# Patient Record
Sex: Male | Born: 1940 | Race: White | Hispanic: No | Marital: Married | Smoking: Never smoker
Health system: Southern US, Community
[De-identification: ages and names within clinical notes are randomized; demographics above are authoritative.]

## PROBLEM LIST (undated history)

## (undated) DIAGNOSIS — G9050 Complex regional pain syndrome I, unspecified: Secondary | ICD-10-CM

## (undated) DIAGNOSIS — G709 Myoneural disorder, unspecified: Secondary | ICD-10-CM

## (undated) DIAGNOSIS — M1990 Unspecified osteoarthritis, unspecified site: Secondary | ICD-10-CM

## (undated) DIAGNOSIS — E785 Hyperlipidemia, unspecified: Secondary | ICD-10-CM

## (undated) DIAGNOSIS — K219 Gastro-esophageal reflux disease without esophagitis: Secondary | ICD-10-CM

## (undated) DIAGNOSIS — G20A1 Parkinson's disease without dyskinesia, without mention of fluctuations: Secondary | ICD-10-CM

## (undated) DIAGNOSIS — E119 Type 2 diabetes mellitus without complications: Secondary | ICD-10-CM

## (undated) DIAGNOSIS — R32 Unspecified urinary incontinence: Secondary | ICD-10-CM

## (undated) DIAGNOSIS — G20 Parkinson's disease: Secondary | ICD-10-CM

## (undated) DIAGNOSIS — F419 Anxiety disorder, unspecified: Secondary | ICD-10-CM

## (undated) HISTORY — DX: Hyperlipidemia, unspecified: E78.5

## (undated) HISTORY — PX: COLON SURGERY: SHX602

## (undated) HISTORY — PX: DEEP BRAIN STIMULATOR PLACEMENT: SHX608

## (undated) HISTORY — PX: CARDIAC CATHETERIZATION: SHX172

## (undated) HISTORY — DX: Parkinson's disease: G20

## (undated) HISTORY — PX: APPENDECTOMY: SHX54

## (undated) HISTORY — DX: Parkinson's disease without dyskinesia, without mention of fluctuations: G20.A1

## (undated) HISTORY — DX: Gastro-esophageal reflux disease without esophagitis: K21.9

## (undated) HISTORY — PX: BRAIN SURGERY: SHX531

---

## 1999-08-13 ENCOUNTER — Encounter: Payer: Self-pay | Admitting: *Deleted

## 1999-08-18 ENCOUNTER — Inpatient Hospital Stay (HOSPITAL_COMMUNITY): Admission: RE | Admit: 1999-08-18 | Discharge: 1999-08-25 | Payer: Self-pay | Admitting: *Deleted

## 1999-10-08 ENCOUNTER — Ambulatory Visit (HOSPITAL_COMMUNITY): Admission: RE | Admit: 1999-10-08 | Discharge: 1999-10-08 | Payer: Self-pay | Admitting: Orthopedic Surgery

## 1999-10-08 ENCOUNTER — Encounter: Payer: Self-pay | Admitting: Orthopedic Surgery

## 1999-12-14 ENCOUNTER — Ambulatory Visit (HOSPITAL_COMMUNITY): Admission: RE | Admit: 1999-12-14 | Discharge: 1999-12-14 | Payer: Self-pay | Admitting: Orthopedic Surgery

## 1999-12-14 ENCOUNTER — Encounter: Payer: Self-pay | Admitting: Orthopedic Surgery

## 2000-07-21 ENCOUNTER — Encounter: Admission: RE | Admit: 2000-07-21 | Discharge: 2000-10-19 | Payer: Self-pay | Admitting: Internal Medicine

## 2005-05-10 ENCOUNTER — Encounter: Admission: RE | Admit: 2005-05-10 | Discharge: 2005-05-10 | Payer: Self-pay | Admitting: Orthopaedic Surgery

## 2005-05-11 ENCOUNTER — Ambulatory Visit (HOSPITAL_COMMUNITY): Admission: RE | Admit: 2005-05-11 | Discharge: 2005-05-11 | Payer: Self-pay | Admitting: Orthopaedic Surgery

## 2005-05-11 ENCOUNTER — Ambulatory Visit (HOSPITAL_BASED_OUTPATIENT_CLINIC_OR_DEPARTMENT_OTHER): Admission: RE | Admit: 2005-05-11 | Discharge: 2005-05-11 | Payer: Self-pay | Admitting: Orthopaedic Surgery

## 2008-09-26 ENCOUNTER — Ambulatory Visit (HOSPITAL_BASED_OUTPATIENT_CLINIC_OR_DEPARTMENT_OTHER): Admission: RE | Admit: 2008-09-26 | Discharge: 2008-09-26 | Payer: Self-pay | Admitting: Orthopedic Surgery

## 2011-04-20 NOTE — Op Note (Signed)
NAME:  TURON, KILMER NO.:  [id number]   MEDICAL RECORD NO.:  [id number]          PATIENT TYPE:  AMB   LOCATION:  DSC                          FACILITY:  [facility]   PHYSICIAN:  Cindee Salt, M.D.       DATE OF BIRTH:  11-15-1941   DATE OF PROCEDURE:  09/26/2008  DATE OF DISCHARGE:                               OPERATIVE REPORT   PREOPERATIVE DIAGNOSIS:  Intra-articular fracture distal phalanx, P2,  right middle finger.   POSTOPERATIVE DIAGNOSIS:  Intra-articular fracture distal phalanx, P2,  right middle finger.   OPERATION:  Closed reduction, percutaneous pinning, intra-articular  fracture distal interphalangeal joint, P2, right middle finger.   SURGEON:  Cindee Salt, MD   ANESTHESIA:  General with metacarpal block.   HISTORY:  The patient is a 70 year old male with a history of a fracture  of his right middle finger when trying to contain his dog.  He had  lesion, he suffered the injury.  X-rays revealed a intra-articular  fracture of the middle phalanx of his right middle finger with  displacement.  Plan is for percutaneous possible open reduction and  internal fixation.  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  He is aware of the possibility of  recurrence of the complex regional pain, infection, recurrence injury to  arteries, nerves, tendons, complete relief of symptoms, dystrophy,  nonunion, avascular necrosis.  He has elect to proceed to have this  pinned to prevent further displacement.  The patient was seen in the  preoperative area.  The extremity marked by both the patient and surgeon  and antibiotic given.   PROCEDURE:  The patient was brought to the operating room where a  general anesthetic was carried out under the direction of Dr. Gelene Mink.  He was prepped using DuraPrep in supine position with right arm free.  Time-out was taken.  The OEC was brought into position.  The finger  manipulated.  X-rays  revealed that it was in a reduced position.  This  was then pinned under image intensification with 2A K-wires.  These were  placed so as not to be parallel.  AP and lateral x-rays with insertion  of each pin and afterwards revealed that the fracture was reduced, the  pins in good position.  The pins were bent and cut short.  A sterile  compressive dressing and splint to the finger applied prior to the  application of the dressing.  A metacarpal block was given with 1%  Xylocaine with 0.25% Marcaine without epinephrine, 6 mL was used.  The  patient tolerated the procedure well.  He will be discharged to home and  return to [facility] in [place] in 1 week on Percocet.           ______________________________  Cindee Salt, M.D.     GK/MEDQ  D:  09/26/2008  T:  09/27/2008  Job:  [id number]

## 2011-04-23 NOTE — Op Note (Signed)
NAME:  Randall [name], Randall [name] NO.:  [id number]   MEDICAL RECORD NO.:  [id number]          PATIENT TYPE:  AMB   LOCATION:  DSC                          FACILITY:  [facility]   PHYSICIAN:  Lubertha Basque. Dalldorf, M.D.DATE OF BIRTH:  06-24-1941   DATE OF PROCEDURE:  05/11/2005  DATE OF DISCHARGE:                                 OPERATIVE REPORT   PREOPERATIVE DIAGNOSES:  1. Right knee torn medial meniscus.  2. Right knee degenerative joint disease.   POSTOPERATIVE DIAGNOSES:  1. Right knee torn medial meniscus.  2. Right knee degenerative joint disease.   PROCEDURES:  1. Right knee partial medial meniscectomy.  2. Right knee abrasion chondroplasty.   ANESTHESIA:  Block and general.   ATTENDING SURGEON:  Lubertha Basque. Jerl Santos, M.D.   ASSISTANT:  Lindwood Qua, P.A.   INDICATIONS FOR PROCEDURE:  The patient is a 70 year old man with a long  history of right knee pain and swelling. This has persisted despite oral  anti-inflammatories and an injection. He has undergone an MRI scan which  shows a medial meniscus tear and some degenerative changes. He is offered an  arthroscopy as he has pain which interrupts his rest and limits his  activities. Informed operative consent was obtained after discussion of  possible complications of reaction to anesthesia and infection.   DESCRIPTION OF PROCEDURE:  The patient was taken to the operating suite  where general anesthetic was applied without difficulty. He was also given a  block in the preanesthesia area.  He was positioned supine and prepped and  draped in normal sterile fashion. After administration of preoperative IV  antibiotics, arthroscopy right knee was performed through a total of two  inferior portals. Suprapatellar pouch was benign while the patellofemoral  joint exhibited some grade III and afocal area of grade IV change in the  intertrochlear groove. A chondroplasty was done of some loose flaps of  articular cartilage and  an abrasion back to bleeding bone was done in  intertrochlear groove and a small area of  grade IV change. In the medial  compartment, he did have a radial tear of the posterior horn of the medial  meniscus addressed with about 5% partial medial meniscectomy. He had some  grade III changes across the femur addressed with a chondroplasty. The  lateral compartment was relatively benign with no significant articular  cartilage or meniscal injuries. The ACL appeared to be intact. The knee was  thoroughly irrigated at the end of the case followed by placement of  Marcaine with some Depo-Medrol. Adaptic was placed over the wounds followed  by dry gauze and loose Ace wrap. Estimated blood loss and intraoperative  fluids can be obtained from anesthesia records.   DISPOSITION:  The patient was extubated in the operating room taken to  recovery in stable condition. Plans were for him to go home the same day and  to follow up in the office in less than a week. I will contact him by phone  tonight.       PGD/MEDQ  D:  05/11/2005  T:  05/11/2005  Job:  [id number] 

## 2011-09-06 LAB — POCT HEMOGLOBIN-HEMACUE: Hemoglobin: 14.2

## 2011-09-06 LAB — BASIC METABOLIC PANEL
BUN: 13
CO2: 25
Calcium: 9.2
Chloride: 105
Creatinine, Ser: 0.84
GFR calc Af Amer: >60
GFR calc non Af Amer: >60
Glucose, Bld: 229 — ABNORMAL HIGH
Potassium: 4.1
Sodium: 139

## 2011-09-06 LAB — GLUCOSE, CAPILLARY: Glucose-Capillary: 117 — ABNORMAL HIGH

## 2011-09-28 ENCOUNTER — Other Ambulatory Visit: Payer: Self-pay | Admitting: Dermatology

## 2013-06-29 ENCOUNTER — Encounter: Payer: Self-pay | Admitting: Internal Medicine

## 2014-01-14 DIAGNOSIS — G20 Parkinson's disease: Secondary | ICD-10-CM | POA: Insufficient documentation

## 2014-01-14 DIAGNOSIS — N400 Enlarged prostate without lower urinary tract symptoms: Secondary | ICD-10-CM | POA: Insufficient documentation

## 2014-01-14 DIAGNOSIS — E291 Testicular hypofunction: Secondary | ICD-10-CM | POA: Insufficient documentation

## 2014-01-31 ENCOUNTER — Encounter: Payer: Self-pay | Admitting: Podiatry

## 2014-01-31 ENCOUNTER — Ambulatory Visit (INDEPENDENT_AMBULATORY_CARE_PROVIDER_SITE_OTHER): Payer: Medicare Other | Admitting: Podiatry

## 2014-01-31 VITALS — BP 116/86 | HR 64 | Resp 12

## 2014-01-31 DIAGNOSIS — B351 Tinea unguium: Secondary | ICD-10-CM

## 2014-01-31 DIAGNOSIS — M19079 Primary osteoarthritis, unspecified ankle and foot: Secondary | ICD-10-CM

## 2014-01-31 DIAGNOSIS — M779 Enthesopathy, unspecified: Secondary | ICD-10-CM

## 2014-01-31 DIAGNOSIS — L84 Corns and callosities: Secondary | ICD-10-CM

## 2014-01-31 DIAGNOSIS — M79609 Pain in unspecified limb: Secondary | ICD-10-CM

## 2014-01-31 MED ORDER — TRIAMCINOLONE ACETONIDE 10 MG/ML IJ SUSP
10.0000 mg | Freq: Once | INTRAMUSCULAR | Status: AC
  Administered 2014-01-31: 10 mg

## 2014-01-31 NOTE — Progress Notes (Signed)
   Subjective:    Patient ID: Randall [name], male    DOB: April 02, 1941, 73 y.o.   MRN: [id number]  HPI '' TOENAILS AND CORN TRIM ON THE RT FOOT 3RD  TOE.'' ALSO, THE GREAT TOENAIL OR WHOLE TOE IS THROBBING FOR 6 MONTHS. THE PAIN IS NOT GOTTEN WORSEN, BUT IS BEEN THE SAME. TREATMENT TRIED NEOSPORIN ONCE A DAY IT HELPS FOR A LITTLE WHILE.''    Review of Systems  All other systems reviewed and are negative.       Objective:   Physical Exam        Assessment & Plan:

## 2014-02-01 NOTE — Progress Notes (Signed)
Subjective:     Patient ID: Randall [name], male   DOB: Apr 11, 1941, 73 y.o.   MRN: [id number]  HPI patient presents stating I am having pain around my toenails I get this painful corn on both feet and I have swelling of my joint of my right big toe which is increasingly tender form he   Review of Systems     Objective:   Physical Exam Neurovascular status unchanged with patient well oriented x3 in incurvated nail bed 1-5 of both feet that are sore with inflammation and fluid around the interphalangeal joint of the right hallux. Patient is found to have keratotic lesions sub-metatarsals of both feet that are painful    Assessment:     Mycotic nail infection with pain 1-5 both feet and keratotic lesions of both feet. 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 Injected the interphalangeal joint right 3 mg Kenalog 5 mg Xylocaine Marcaine mixture to reduce inflammation

## 2014-02-05 ENCOUNTER — Other Ambulatory Visit: Payer: Self-pay | Admitting: Orthopaedic Surgery

## 2014-02-12 ENCOUNTER — Encounter (HOSPITAL_COMMUNITY): Payer: Self-pay

## 2014-02-12 ENCOUNTER — Encounter (HOSPITAL_COMMUNITY)
Admission: RE | Admit: 2014-02-12 | Discharge: 2014-02-12 | Disposition: A | Discharge status: Home / Self Care | Payer: Medicare Other | Source: Ambulatory Visit | Attending: Orthopaedic Surgery | Admitting: Orthopaedic Surgery

## 2014-02-12 ENCOUNTER — Ambulatory Visit (HOSPITAL_COMMUNITY)
Admission: RE | Admit: 2014-02-12 | Discharge: 2014-02-12 | Disposition: A | Discharge status: Home / Self Care | Payer: Medicare Other | Source: Ambulatory Visit | Attending: Orthopaedic Surgery | Admitting: Orthopaedic Surgery

## 2014-02-12 DIAGNOSIS — Z01818 Encounter for other preprocedural examination: Secondary | ICD-10-CM | POA: Insufficient documentation

## 2014-02-12 DIAGNOSIS — Z01810 Encounter for preprocedural cardiovascular examination: Secondary | ICD-10-CM | POA: Insufficient documentation

## 2014-02-12 DIAGNOSIS — Z01812 Encounter for preprocedural laboratory examination: Secondary | ICD-10-CM | POA: Insufficient documentation

## 2014-02-12 HISTORY — DX: Anxiety disorder, unspecified: F41.9

## 2014-02-12 HISTORY — DX: Unspecified osteoarthritis, unspecified site: M19.90

## 2014-02-12 HISTORY — DX: Complex regional pain syndrome I, unspecified: G90.50

## 2014-02-12 HISTORY — DX: Unspecified urinary incontinence: R32

## 2014-02-12 HISTORY — DX: Type 2 diabetes mellitus without complications: E11.9

## 2014-02-12 HISTORY — DX: Myoneural disorder, unspecified: G70.9

## 2014-02-12 LAB — TYPE AND SCREEN
ABO/RH(D): O POS
Antibody Screen: NEGATIVE

## 2014-02-12 LAB — PROTIME-INR
INR: 1.03 (ref 0.00–1.49)
Prothrombin Time: 13.3 seconds (ref 11.6–15.2)

## 2014-02-12 LAB — URINE MICROSCOPIC-ADD ON

## 2014-02-12 LAB — URINALYSIS, ROUTINE W REFLEX MICROSCOPIC
Bilirubin Urine: NEGATIVE
HGB URINE DIPSTICK: NEGATIVE
KETONES UR: NEGATIVE mg/dL
Leukocytes, UA: NEGATIVE
Nitrite: NEGATIVE
PROTEIN: NEGATIVE mg/dL
Specific Gravity, Urine: 1.025 (ref 1.005–1.030)
Urobilinogen, UA: 0.2 mg/dL (ref 0.0–1.0)
pH: 5 (ref 5.0–8.0)

## 2014-02-12 LAB — BASIC METABOLIC PANEL
BUN: 15 mg/dL (ref 6–23)
CO2: 26 mEq/L (ref 19–32)
Calcium: 9.1 mg/dL (ref 8.4–10.5)
Chloride: 101 mEq/L (ref 96–112)
Creatinine, Ser: 0.71 mg/dL (ref 0.50–1.35)
Glucose, Bld: 262 mg/dL — ABNORMAL HIGH (ref 70–99)
POTASSIUM: 4 meq/L (ref 3.7–5.3)
Sodium: 140 mEq/L (ref 137–147)

## 2014-02-12 LAB — CBC WITH DIFFERENTIAL/PLATELET
BASOS ABS: 0 10*3/uL (ref 0.0–0.1)
BASOS PCT: 0 % (ref 0–1)
Eosinophils Absolute: 0.5 10*3/uL (ref 0.0–0.7)
Eosinophils Relative: 5 % (ref 0–5)
HCT: 36.9 % — ABNORMAL LOW (ref 39.0–52.0)
HEMOGLOBIN: 13 g/dL (ref 13.0–17.0)
Lymphocytes Relative: 18 % (ref 12–46)
Lymphs Abs: 1.7 10*3/uL (ref 0.7–4.0)
MCH: 33.9 pg (ref 26.0–34.0)
MCHC: 35.2 g/dL (ref 30.0–36.0)
MCV: 96.1 fL (ref 78.0–100.0)
MONOS PCT: 7 % (ref 3–12)
Monocytes Absolute: 0.6 10*3/uL (ref 0.1–1.0)
NEUTROS ABS: 6.8 10*3/uL (ref 1.7–7.7)
Neutrophils Relative %: 70 % (ref 43–77)
Platelets: 209 10*3/uL (ref 150–400)
RBC: 3.84 MIL/uL — ABNORMAL LOW (ref 4.22–5.81)
RDW: 14.5 % (ref 11.5–15.5)
WBC: 9.7 10*3/uL (ref 4.0–10.5)

## 2014-02-12 LAB — SURGICAL PCR SCREEN
MRSA, PCR: NEGATIVE
STAPHYLOCOCCUS AUREUS: NEGATIVE

## 2014-02-12 LAB — APTT: aPTT: 29 seconds (ref 24–37)

## 2014-02-12 LAB — ABO/RH: ABO/RH(D): O POS

## 2014-02-12 NOTE — Pre-Procedure Instructions (Signed)
Randall [name]  02/12/2014   Your procedure is scheduled on:  Tuesday, March 17th   [facility] at 5;30 AM.   Call this number if you have problems the morning of surgery: [phone]   Remember:   Do not eat food or drink liquids after midnight Monday.   Take these medicines the morning of surgery with A SIP OF WATER: Effexor, Sinemet IR   Do not wear jewelry - no rings or watches.  Do not wear lotions or colognes.  You may NOT wear deodorant.   Men may shave face and neck.   Do not bring valuables to the hospital.  [facility] any belongings or valuables.               Contacts, dentures or bridgework may not be worn into surgery.  Leave suitcase in the car. After surgery it may be brought to your room.  For patients admitted to the hospital, discharge time is determined by your treatment team.               Name and phone number of your driver:    Special Instructions:  "Preparing for Surgery" Instruction Sheet   Please read over the following fact sheets that you were given: Pain Booklet, Coughing and Deep Breathing, Blood Transfusion Information, MRSA Information and Surgical Site Infection Prevention

## 2014-02-12 NOTE — Progress Notes (Addendum)
H/O MVP---had seen Dr. Jenne CampusMcQueen and had a cath about 10 yrs ago.Marland Kitchen.Hasn't seen him since then.  But relies on Dr. Wylene Simmerisovec now. His Neuro [facility]ison [facility]--lov 2014 ("see outside records") Urology is Dr. Darvin Neighbourson Davis

## 2014-02-13 NOTE — H&P (Signed)
TOTAL KNEE ADMISSION H&P  Patient is being admitted for right total knee arthroplasty.  Subjective:  Chief Complaint:right knee pain.  HPI: Randall [name], 73 y.o. male, has a history of pain and functional disability in the right knee due to arthritis and has failed non-surgical conservative treatments for greater than 12 weeks to includeNSAID's and/or analgesics, corticosteriod injections, viscosupplementation injections, supervised PT with diminished ADL's post treatment, use of assistive devices, weight reduction as appropriate and activity modification.  Onset of symptoms was gradual, starting 6 years ago with gradually worsening course since that time. The patient noted prior procedures on the knee to include  arthroscopy on the right knee(s).  Patient currently rates pain in the right knee(s) at 9 out of 10 with activity. Patient has night pain, worsening of pain with activity and weight bearing, pain that interferes with activities of daily living, pain with passive range of motion, crepitus and joint swelling.  Patient has evidence of subchondral sclerosis, periarticular osteophytes and joint space narrowing by imaging studies. This patient has had no. There is no active infection.  There are no active problems to display for this patient.  Past Medical History  Diagnosis Date  . Hyperlipidemia   . Parkinson disease   . Incontinence of urine     DR  RON   DAVIS    LOV FEB. 2015  . Neuromuscular disorder   . Arthritis   . Diabetes mellitus without complication   . Anxiety   . RSD (reflex sympathetic dystrophy)     2005    Past Surgical History  Procedure Laterality Date  . Appendectomy    . Cardiac catheterization      2005  . Colon surgery      resection diverticulitis  . Knee arthroscopy      right knee    No prescriptions prior to admission   Allergies  Allergen Reactions  . Iodine Nausea And Vomiting  . Shellfish Allergy Nausea And Vomiting    History   Substance Use Topics  . Smoking status: Never Smoker   . Smokeless tobacco: Not on file  . Alcohol Use: 12.0 oz/week    20 Cans of beer per week    No family history on file.   Review of Systems  Constitutional: Negative.   HENT: Negative.   Eyes: Negative.   Respiratory: Negative.   Cardiovascular: Negative.   Gastrointestinal: Negative.   Genitourinary: Negative.   Musculoskeletal: Negative.   Skin: Negative.   Neurological: Positive for tremors.  Endo/Heme/Allergies: Negative.   Psychiatric/Behavioral: Negative.     Objective:  Physical Exam  Constitutional: He appears well-developed.  HENT:  Head: Normocephalic.  Eyes: Pupils are equal, round, and reactive to light.  Neck: Normal range of motion.  Cardiovascular: Normal rate.   Respiratory: Effort normal.  GI: Soft.  Musculoskeletal:  Right knee exam: Antalgic gait.  Motion 5-1 10.  Tracy effusion.  Crepitation 1+.  Pain medial joint line.  Neurological: He is alert.  Skin: Skin is warm.  Psychiatric: He has a normal mood and affect.    Vital signs in last 24 hours: Temp:  [98 F (36.7 C)] 98 F (36.7 C) (03/10 1419) Pulse Rate:  [101] 101 (03/10 1419) Resp:  [18] 18 (03/10 1419) BP: (137)/(76) 137/76 mmHg (03/10 1419) SpO2:  [96 %] 96 % (03/10 1419) Weight:  [89.767 kg (197 lb 14.4 oz)] 89.767 kg (197 lb 14.4 oz) (03/10 1419)  Labs:   There is no height or weight  on file to calculate BMI.   Imaging Review Plain radiographs demonstrate severe degenerative joint disease of the right knee(s). The overall alignment isneutral. The bone quality appears to be good for age and reported activity level.  Assessment/Plan:  End stage arthritis, right knee   The patient history, physical examination, clinical judgment of the provider and imaging studies are consistent with end stage degenerative joint disease of the right knee(s) and total knee arthroplasty is deemed medically necessary. The treatment options  including medical management, injection therapy arthroscopy and arthroplasty were discussed at length. The risks and benefits of total knee arthroplasty were presented and reviewed. The risks due to aseptic loosening, infection, stiffness, patella tracking problems, thromboembolic complications and other imponderables were discussed. The patient acknowledged the explanation, agreed to proceed with the plan and consent was signed. Patient is being admitted for inpatient treatment for surgery, pain control, PT, OT, prophylactic antibiotics, VTE prophylaxis, progressive ambulation and ADL's and discharge planning. The patient is planning to be discharged to skilled nursing facility

## 2014-02-18 MED ORDER — CEFAZOLIN SODIUM-DEXTROSE 2-3 GM-% IV SOLR
2.0000 g | INTRAVENOUS | Status: AC
  Administered 2014-02-19: 2 g via INTRAVENOUS
  Filled 2014-02-18: qty 50

## 2014-02-19 ENCOUNTER — Encounter (HOSPITAL_COMMUNITY)
Admission: RE | Disposition: A | Discharge status: Skilled Nursing Facility | Payer: Self-pay | Source: Ambulatory Visit | Attending: Orthopaedic Surgery

## 2014-02-19 ENCOUNTER — Encounter (HOSPITAL_COMMUNITY): Payer: Self-pay | Admitting: *Deleted

## 2014-02-19 ENCOUNTER — Inpatient Hospital Stay (HOSPITAL_COMMUNITY)
Admission: RE | Admit: 2014-02-19 | Discharge: 2014-02-22 | DRG: 470 | Disposition: A | Discharge status: Skilled Nursing Facility | Payer: Medicare Other | Source: Ambulatory Visit | Attending: Orthopaedic Surgery | Admitting: Orthopaedic Surgery

## 2014-02-19 ENCOUNTER — Encounter (HOSPITAL_COMMUNITY): Payer: Medicare Other | Admitting: Anesthesiology

## 2014-02-19 ENCOUNTER — Inpatient Hospital Stay (HOSPITAL_COMMUNITY): Payer: Medicare Other | Admitting: Anesthesiology

## 2014-02-19 DIAGNOSIS — E119 Type 2 diabetes mellitus without complications: Secondary | ICD-10-CM | POA: Diagnosis present

## 2014-02-19 DIAGNOSIS — G9050 Complex regional pain syndrome I, unspecified: Secondary | ICD-10-CM | POA: Diagnosis present

## 2014-02-19 DIAGNOSIS — G20 Parkinson's disease: Secondary | ICD-10-CM | POA: Diagnosis present

## 2014-02-19 DIAGNOSIS — Z91013 Allergy to seafood: Secondary | ICD-10-CM

## 2014-02-19 DIAGNOSIS — M1710 Unilateral primary osteoarthritis, unspecified knee: Principal | ICD-10-CM | POA: Diagnosis present

## 2014-02-19 DIAGNOSIS — M1711 Unilateral primary osteoarthritis, right knee: Secondary | ICD-10-CM | POA: Diagnosis present

## 2014-02-19 DIAGNOSIS — E1169 Type 2 diabetes mellitus with other specified complication: Secondary | ICD-10-CM | POA: Diagnosis present

## 2014-02-19 DIAGNOSIS — G20A1 Parkinson's disease without dyskinesia, without mention of fluctuations: Secondary | ICD-10-CM | POA: Diagnosis present

## 2014-02-19 DIAGNOSIS — E785 Hyperlipidemia, unspecified: Secondary | ICD-10-CM | POA: Diagnosis present

## 2014-02-19 DIAGNOSIS — F411 Generalized anxiety disorder: Secondary | ICD-10-CM | POA: Diagnosis present

## 2014-02-19 DIAGNOSIS — Z888 Allergy status to other drugs, medicaments and biological substances status: Secondary | ICD-10-CM

## 2014-02-19 HISTORY — PX: TOTAL KNEE ARTHROPLASTY: SHX125

## 2014-02-19 LAB — GLUCOSE, CAPILLARY
GLUCOSE-CAPILLARY: 254 mg/dL — AB (ref 70–99)
Glucose-Capillary: 155 mg/dL — ABNORMAL HIGH (ref 70–99)
Glucose-Capillary: 244 mg/dL — ABNORMAL HIGH (ref 70–99)
Glucose-Capillary: 187 mg/dL — ABNORMAL HIGH (ref 70–99)
Glucose-Capillary: 196 mg/dL — ABNORMAL HIGH (ref 70–99)

## 2014-02-19 SURGERY — ARTHROPLASTY, KNEE, TOTAL
Anesthesia: Spinal | Site: Knee | Laterality: Right

## 2014-02-19 MED ORDER — ALUM & MAG HYDROXIDE-SIMETH 200-200-20 MG/5ML PO SUSP: 30.0000 mL | ORAL | Status: DC | PRN

## 2014-02-19 MED ORDER — ACETAMINOPHEN 650 MG RE SUPP: 650.0000 mg | Freq: Four times a day (QID) | RECTAL | Status: DC | PRN

## 2014-02-19 MED ORDER — TRANEXAMIC ACID 100 MG/ML IV SOLN
1000.0000 mg | INTRAVENOUS | Status: DC | PRN
  Administered 2014-02-19: 1000 mg via INTRAVENOUS

## 2014-02-19 MED ORDER — SODIUM CHLORIDE 0.9 % IJ SOLN
INTRAMUSCULAR | Status: AC
  Filled 2014-02-19: qty 10

## 2014-02-19 MED ORDER — PROPOFOL 10 MG/ML IV BOLUS
INTRAVENOUS | Status: DC | PRN
  Administered 2014-02-19: 150 mg via INTRAVENOUS

## 2014-02-19 MED ORDER — FENTANYL CITRATE 0.05 MG/ML IJ SOLN
INTRAMUSCULAR | Status: AC
  Filled 2014-02-19: qty 5

## 2014-02-19 MED ORDER — CARBIDOPA-LEVODOPA ER 25-100 MG PO TBCR: 1.5000 | EXTENDED_RELEASE_TABLET | Freq: Three times a day (TID) | ORAL | Status: DC

## 2014-02-19 MED ORDER — ONDANSETRON HCL 4 MG PO TABS: 4.0000 mg | ORAL_TABLET | Freq: Four times a day (QID) | ORAL | Status: DC | PRN

## 2014-02-19 MED ORDER — LACTATED RINGERS IV SOLN
INTRAVENOUS | Status: DC
  Administered 2014-02-19: 21:00:00 via INTRAVENOUS
  Administered 2014-02-19: 75 mL/h via INTRAVENOUS

## 2014-02-19 MED ORDER — EPHEDRINE SULFATE 50 MG/ML IJ SOLN
INTRAMUSCULAR | Status: AC
  Filled 2014-02-19: qty 1

## 2014-02-19 MED ORDER — DIPHENHYDRAMINE HCL 12.5 MG/5ML PO ELIX: 12.5000 mg | ORAL_SOLUTION | ORAL | Status: DC | PRN

## 2014-02-19 MED ORDER — BISACODYL 5 MG PO TBEC
5.0000 mg | DELAYED_RELEASE_TABLET | Freq: Every day | ORAL | Status: DC | PRN
  Administered 2014-02-21: 5 mg via ORAL
  Filled 2014-02-19: qty 1

## 2014-02-19 MED ORDER — INSULIN ASPART 100 UNIT/ML ~~LOC~~ SOLN
SUBCUTANEOUS | Status: AC
  Filled 2014-02-19: qty 6

## 2014-02-19 MED ORDER — BUPIVACAINE LIPOSOME 1.3 % IJ SUSP
INTRAMUSCULAR | Status: DC | PRN
  Administered 2014-02-19: 20 mL

## 2014-02-19 MED ORDER — METFORMIN HCL 500 MG PO TABS
500.0000 mg | ORAL_TABLET | Freq: Every day | ORAL | Status: DC
  Administered 2014-02-19 – 2014-02-21 (×3): 500 mg via ORAL
  Filled 2014-02-19 (×4): qty 1

## 2014-02-19 MED ORDER — METOCLOPRAMIDE HCL 10 MG PO TABS: 5.0000 mg | ORAL_TABLET | Freq: Three times a day (TID) | ORAL | Status: DC | PRN

## 2014-02-19 MED ORDER — METOCLOPRAMIDE HCL 5 MG/ML IJ SOLN: 5.0000 mg | Freq: Three times a day (TID) | INTRAMUSCULAR | Status: DC | PRN

## 2014-02-19 MED ORDER — INSULIN DETEMIR 100 UNIT/ML ~~LOC~~ SOLN
78.0000 [IU] | Freq: Two times a day (BID) | SUBCUTANEOUS | Status: DC
  Administered 2014-02-19 – 2014-02-21 (×4): 78 [IU] via SUBCUTANEOUS
  Filled 2014-02-19 (×7): qty 0.78

## 2014-02-19 MED ORDER — FERROUS SULFATE 325 (65 FE) MG PO TABS
325.0000 mg | ORAL_TABLET | Freq: Two times a day (BID) | ORAL | Status: DC
  Administered 2014-02-20 – 2014-02-22 (×5): 325 mg via ORAL
  Filled 2014-02-19 (×8): qty 1

## 2014-02-19 MED ORDER — FENTANYL CITRATE 0.05 MG/ML IJ SOLN: 50.0000 ug | Freq: Once | INTRAMUSCULAR | Status: DC

## 2014-02-19 MED ORDER — FUROSEMIDE 20 MG PO TABS
20.0000 mg | ORAL_TABLET | Freq: Every day | ORAL | Status: DC
  Administered 2014-02-20 – 2014-02-22 (×3): 20 mg via ORAL
  Filled 2014-02-19 (×5): qty 1

## 2014-02-19 MED ORDER — CARBIDOPA-LEVODOPA 25-100 MG PO TABS
0.5000 | ORAL_TABLET | Freq: Three times a day (TID) | ORAL | Status: DC
  Administered 2014-02-19 – 2014-02-22 (×9): 0.5 via ORAL
  Filled 2014-02-19 (×12): qty 0.5

## 2014-02-19 MED ORDER — ONDANSETRON HCL 4 MG/2ML IJ SOLN
INTRAMUSCULAR | Status: DC | PRN
  Administered 2014-02-19: 4 mg via INTRAVENOUS

## 2014-02-19 MED ORDER — SIMVASTATIN 40 MG PO TABS
40.0000 mg | ORAL_TABLET | Freq: Every day | ORAL | Status: DC
  Administered 2014-02-19 – 2014-02-21 (×3): 40 mg via ORAL
  Filled 2014-02-19 (×4): qty 1

## 2014-02-19 MED ORDER — MIDAZOLAM HCL 5 MG/5ML IJ SOLN
INTRAMUSCULAR | Status: DC | PRN
  Administered 2014-02-19 (×2): 1 mg via INTRAVENOUS

## 2014-02-19 MED ORDER — VENLAFAXINE HCL ER 150 MG PO CP24
150.0000 mg | ORAL_CAPSULE | Freq: Every day | ORAL | Status: DC
  Administered 2014-02-20 – 2014-02-22 (×3): 150 mg via ORAL
  Filled 2014-02-19 (×4): qty 1

## 2014-02-19 MED ORDER — CARBIDOPA-LEVODOPA ER 25-100 MG PO TBCR
1.0000 | EXTENDED_RELEASE_TABLET | Freq: Three times a day (TID) | ORAL | Status: DC
  Administered 2014-02-19 – 2014-02-22 (×9): 1 via ORAL
  Filled 2014-02-19 (×12): qty 1

## 2014-02-19 MED ORDER — ROPINIROLE HCL 1 MG PO TABS
4.0000 mg | ORAL_TABLET | Freq: Two times a day (BID) | ORAL | Status: DC
  Administered 2014-02-19 – 2014-02-22 (×6): 4 mg via ORAL
  Filled 2014-02-19 (×7): qty 4

## 2014-02-19 MED ORDER — ROPINIROLE HCL ER 8 MG PO TB24
8.0000 mg | ORAL_TABLET | Freq: Every day | ORAL | Status: DC
  Filled 2014-02-19: qty 1

## 2014-02-19 MED ORDER — PHENOL 1.4 % MT LIQD: 1.0000 | OROMUCOSAL | Status: DC | PRN

## 2014-02-19 MED ORDER — DEXAMETHASONE SODIUM PHOSPHATE 10 MG/ML IJ SOLN
INTRAMUSCULAR | Status: DC | PRN
  Administered 2014-02-19: 4 mg

## 2014-02-19 MED ORDER — DOCUSATE SODIUM 100 MG PO CAPS
100.0000 mg | ORAL_CAPSULE | Freq: Two times a day (BID) | ORAL | Status: DC
  Administered 2014-02-19 – 2014-02-22 (×6): 100 mg via ORAL
  Filled 2014-02-19 (×7): qty 1

## 2014-02-19 MED ORDER — MIDAZOLAM HCL 2 MG/2ML IJ SOLN
INTRAMUSCULAR | Status: AC
  Filled 2014-02-19: qty 2

## 2014-02-19 MED ORDER — PROPOFOL 10 MG/ML IV BOLUS
INTRAVENOUS | Status: AC
  Filled 2014-02-19: qty 20

## 2014-02-19 MED ORDER — ACETAMINOPHEN 325 MG PO TABS: 650.0000 mg | ORAL_TABLET | Freq: Four times a day (QID) | ORAL | Status: DC | PRN

## 2014-02-19 MED ORDER — FENTANYL CITRATE 0.05 MG/ML IJ SOLN
INTRAMUSCULAR | Status: DC | PRN
  Administered 2014-02-19 (×4): 50 ug via INTRAVENOUS
  Administered 2014-02-19: 100 ug via INTRAVENOUS

## 2014-02-19 MED ORDER — TRANEXAMIC ACID 100 MG/ML IV SOLN
1000.0000 mg | INTRAVENOUS | Status: AC
  Filled 2014-02-19: qty 10

## 2014-02-19 MED ORDER — INSULIN ASPART 100 UNIT/ML ~~LOC~~ SOLN
0.0000 [IU] | Freq: Three times a day (TID) | SUBCUTANEOUS | Status: DC
  Administered 2014-02-19: 3 [IU] via SUBCUTANEOUS
  Administered 2014-02-19: 5 [IU] via SUBCUTANEOUS
  Administered 2014-02-20: 2 [IU] via SUBCUTANEOUS
  Administered 2014-02-20: 3 [IU] via SUBCUTANEOUS
  Administered 2014-02-20: 8 [IU] via SUBCUTANEOUS
  Administered 2014-02-22: 2 [IU] via SUBCUTANEOUS
  Administered 2014-02-22: 3 [IU] via SUBCUTANEOUS

## 2014-02-19 MED ORDER — ONDANSETRON HCL 4 MG/2ML IJ SOLN: 4.0000 mg | Freq: Four times a day (QID) | INTRAMUSCULAR | Status: DC | PRN

## 2014-02-19 MED ORDER — SODIUM CHLORIDE 0.9 % IR SOLN
Status: DC | PRN
  Administered 2014-02-19 (×2): 1000 mL

## 2014-02-19 MED ORDER — MENTHOL 3 MG MT LOZG
1.0000 | LOZENGE | OROMUCOSAL | Status: DC | PRN
Start: 2014-02-19 — End: 2014-02-22

## 2014-02-19 MED ORDER — INSULIN ASPART 100 UNIT/ML ~~LOC~~ SOLN
6.0000 [IU] | Freq: Once | SUBCUTANEOUS | Status: AC
  Administered 2014-02-19: 6 [IU] via SUBCUTANEOUS

## 2014-02-19 MED ORDER — LACTATED RINGERS IV SOLN
INTRAVENOUS | Status: DC | PRN
  Administered 2014-02-19 (×2): via INTRAVENOUS

## 2014-02-19 MED ORDER — OXYCODONE HCL 5 MG/5ML PO SOLN: 5.0000 mg | Freq: Once | ORAL | Status: DC | PRN

## 2014-02-19 MED ORDER — ASPIRIN EC 325 MG PO TBEC
325.0000 mg | DELAYED_RELEASE_TABLET | Freq: Two times a day (BID) | ORAL | Status: DC
  Administered 2014-02-20 – 2014-02-22 (×5): 325 mg via ORAL
  Filled 2014-02-19 (×7): qty 1

## 2014-02-19 MED ORDER — PHENYLEPHRINE HCL 10 MG/ML IJ SOLN
INTRAMUSCULAR | Status: DC | PRN
  Administered 2014-02-19: 80 ug via INTRAVENOUS
  Administered 2014-02-19: 40 ug via INTRAVENOUS
  Administered 2014-02-19 (×4): 80 ug via INTRAVENOUS
  Administered 2014-02-19: 40 ug via INTRAVENOUS

## 2014-02-19 MED ORDER — HYDROMORPHONE HCL PF 1 MG/ML IJ SOLN: 0.2500 mg | INTRAMUSCULAR | Status: DC | PRN

## 2014-02-19 MED ORDER — SODIUM CHLORIDE 0.9 % IJ SOLN
INTRAMUSCULAR | Status: DC | PRN
  Administered 2014-02-19: 20 mL

## 2014-02-19 MED ORDER — METHOCARBAMOL 500 MG PO TABS
500.0000 mg | ORAL_TABLET | Freq: Four times a day (QID) | ORAL | Status: DC | PRN
  Administered 2014-02-19 – 2014-02-20 (×2): 500 mg via ORAL
  Filled 2014-02-19 (×2): qty 1

## 2014-02-19 MED ORDER — DEXTROSE 5 % IV SOLN
500.0000 mg | Freq: Four times a day (QID) | INTRAVENOUS | Status: DC | PRN
  Filled 2014-02-19: qty 5

## 2014-02-19 MED ORDER — METFORMIN HCL ER 500 MG PO TB24
1000.0000 mg | ORAL_TABLET | Freq: Every day | ORAL | Status: DC
  Administered 2014-02-20 – 2014-02-22 (×3): 1000 mg via ORAL
  Filled 2014-02-19 (×4): qty 2

## 2014-02-19 MED ORDER — MIRABEGRON ER 50 MG PO TB24
100.0000 mg | ORAL_TABLET | Freq: Every day | ORAL | Status: DC
  Administered 2014-02-19 – 2014-02-21 (×3): 100 mg via ORAL
  Filled 2014-02-19 (×4): qty 2

## 2014-02-19 MED ORDER — BUPIVACAINE-EPINEPHRINE PF 0.5-1:200000 % IJ SOLN
INTRAMUSCULAR | Status: DC | PRN
  Administered 2014-02-19: 25 mL

## 2014-02-19 MED ORDER — MIDAZOLAM HCL 2 MG/2ML IJ SOLN: 1.0000 mg | INTRAMUSCULAR | Status: DC | PRN

## 2014-02-19 MED ORDER — HYDROCODONE-ACETAMINOPHEN 5-325 MG PO TABS
1.0000 | ORAL_TABLET | ORAL | Status: DC | PRN
  Administered 2014-02-19 – 2014-02-21 (×7): 2 via ORAL
  Filled 2014-02-19 (×7): qty 2

## 2014-02-19 MED ORDER — OXYCODONE HCL 5 MG PO TABS: 5.0000 mg | ORAL_TABLET | Freq: Once | ORAL | Status: DC | PRN

## 2014-02-19 MED ORDER — MIRABEGRON ER 50 MG PO TB24
50.0000 mg | ORAL_TABLET | Freq: Every day | ORAL | Status: DC
  Filled 2014-02-19: qty 1

## 2014-02-19 MED ORDER — LIDOCAINE HCL (CARDIAC) 20 MG/ML IV SOLN
INTRAVENOUS | Status: DC | PRN
  Administered 2014-02-19: 100 mg via INTRAVENOUS

## 2014-02-19 MED ORDER — HYDROMORPHONE HCL PF 1 MG/ML IJ SOLN
0.5000 mg | INTRAMUSCULAR | Status: DC | PRN
  Administered 2014-02-20: 1 mg via INTRAVENOUS
  Filled 2014-02-19: qty 1

## 2014-02-19 MED ORDER — PHENYLEPHRINE 40 MCG/ML (10ML) SYRINGE FOR IV PUSH (FOR BLOOD PRESSURE SUPPORT)
PREFILLED_SYRINGE | INTRAVENOUS | Status: AC
  Filled 2014-02-19: qty 20

## 2014-02-19 MED ORDER — RASAGILINE MESYLATE 1 MG PO TABS
1.0000 mg | ORAL_TABLET | Freq: Every day | ORAL | Status: DC
  Administered 2014-02-20 – 2014-02-22 (×3): 1 mg via ORAL
  Filled 2014-02-19 (×4): qty 1

## 2014-02-19 MED ORDER — CEFAZOLIN SODIUM-DEXTROSE 2-3 GM-% IV SOLR
2.0000 g | Freq: Four times a day (QID) | INTRAVENOUS | Status: AC
  Administered 2014-02-19 (×2): 2 g via INTRAVENOUS
  Filled 2014-02-19 (×2): qty 50

## 2014-02-19 SURGICAL SUPPLY — 69 items
BANDAGE ELASTIC 4 VELCRO ST LF (GAUZE/BANDAGES/DRESSINGS) ×2 IMPLANT
BANDAGE ELASTIC 6 VELCRO ST LF (GAUZE/BANDAGES/DRESSINGS) ×2 IMPLANT
BANDAGE ESMARK 6X9 LF (GAUZE/BANDAGES/DRESSINGS) ×1 IMPLANT
BANDAGE GAUZE ELAST BULKY 4 IN (GAUZE/BANDAGES/DRESSINGS) ×4 IMPLANT
BENZOIN TINCTURE PRP APPL 2/3 (GAUZE/BANDAGES/DRESSINGS) ×2 IMPLANT
BLADE SAGITTAL 25.0X1.19X90 (BLADE) ×2 IMPLANT
BLADE SURG 10 STRL SS (BLADE) ×4 IMPLANT
BLADE SURG ROTATE 9660 (MISCELLANEOUS) IMPLANT
BNDG ELASTIC 6X10 VLCR STRL LF (GAUZE/BANDAGES/DRESSINGS) ×2 IMPLANT
BNDG ESMARK 6X9 LF (GAUZE/BANDAGES/DRESSINGS) ×2
BNDG GAUZE ELAST 4 BULKY (GAUZE/BANDAGES/DRESSINGS) ×2 IMPLANT
BOWL SMART MIX CTS (DISPOSABLE) ×2 IMPLANT
CAPT RP KNEE ×2 IMPLANT
CEMENT HV SMART SET (Cement) ×4 IMPLANT
CLSR STERI-STRIP ANTIMIC 1/2X4 (GAUZE/BANDAGES/DRESSINGS) ×2 IMPLANT
COVER SURGICAL LIGHT HANDLE (MISCELLANEOUS) ×2 IMPLANT
CUFF TOURNIQUET SINGLE 34IN LL (TOURNIQUET CUFF) ×2 IMPLANT
CUFF TOURNIQUET SINGLE 44IN (TOURNIQUET CUFF) IMPLANT
DRAPE EXTREMITY T 121X128X90 (DRAPE) ×2 IMPLANT
DRAPE PROXIMA HALF (DRAPES) ×2 IMPLANT
DRAPE U-SHAPE 47X51 STRL (DRAPES) ×2 IMPLANT
DRSG ADAPTIC 3X8 NADH LF (GAUZE/BANDAGES/DRESSINGS) ×2 IMPLANT
DRSG PAD ABDOMINAL 8X10 ST (GAUZE/BANDAGES/DRESSINGS) ×2 IMPLANT
DURAPREP 26ML APPLICATOR (WOUND CARE) ×2 IMPLANT
ELECT REM PT RETURN 9FT ADLT (ELECTROSURGICAL) ×2
ELECTRODE REM PT RTRN 9FT ADLT (ELECTROSURGICAL) ×1 IMPLANT
FACESHIELD LNG OPTICON STERILE (SAFETY) ×4 IMPLANT
GLOVE BIO SURGEON STRL SZ8.5 (GLOVE) ×2 IMPLANT
GLOVE BIOGEL PI IND STRL 8 (GLOVE) ×1 IMPLANT
GLOVE BIOGEL PI IND STRL 8.5 (GLOVE) ×1 IMPLANT
GLOVE BIOGEL PI INDICATOR 8 (GLOVE) ×1
GLOVE BIOGEL PI INDICATOR 8.5 (GLOVE) ×1
GLOVE SS BIOGEL STRL SZ 8 (GLOVE) ×1 IMPLANT
GLOVE SUPERSENSE BIOGEL SZ 8 (GLOVE) ×1
GOWN STRL REUS W/ TWL LRG LVL3 (GOWN DISPOSABLE) ×1 IMPLANT
GOWN STRL REUS W/ TWL XL LVL3 (GOWN DISPOSABLE) ×1 IMPLANT
GOWN STRL REUS W/TWL 2XL LVL3 (GOWN DISPOSABLE) ×2 IMPLANT
GOWN STRL REUS W/TWL LRG LVL3 (GOWN DISPOSABLE) ×2
GOWN STRL REUS W/TWL XL LVL3 (GOWN DISPOSABLE) ×1
HANDPIECE INTERPULSE COAX TIP (DISPOSABLE) ×1
HOOD PEEL AWAY FACE SHEILD DIS (HOOD) ×2 IMPLANT
IMMOBILIZER KNEE 20 (SOFTGOODS) IMPLANT
IMMOBILIZER KNEE 22 UNIV (SOFTGOODS) ×4 IMPLANT
IMMOBILIZER KNEE 24 THIGH 36 (MISCELLANEOUS) IMPLANT
IMMOBILIZER KNEE 24 UNIV (MISCELLANEOUS)
KIT BASIN OR (CUSTOM PROCEDURE TRAY) ×2 IMPLANT
KIT ROOM TURNOVER OR (KITS) ×2 IMPLANT
MANIFOLD NEPTUNE II (INSTRUMENTS) ×2 IMPLANT
NEEDLE HYPO 21X1 ECLIPSE (NEEDLE) ×2 IMPLANT
NEEDLE SPNL 18GX3.5 QUINCKE PK (NEEDLE) ×2 IMPLANT
NS IRRIG 1000ML POUR BTL (IV SOLUTION) ×2 IMPLANT
PACK TOTAL JOINT (CUSTOM PROCEDURE TRAY) ×2 IMPLANT
PAD ABD 8X10 STRL (GAUZE/BANDAGES/DRESSINGS) ×2 IMPLANT
PAD ARMBOARD 7.5X6 YLW CONV (MISCELLANEOUS) ×4 IMPLANT
SET HNDPC FAN SPRY TIP SCT (DISPOSABLE) ×1 IMPLANT
SPONGE GAUZE 4X4 12PLY (GAUZE/BANDAGES/DRESSINGS) ×2 IMPLANT
STAPLER VISISTAT 35W (STAPLE) IMPLANT
SUCTION FRAZIER TIP 10 FR DISP (SUCTIONS) IMPLANT
SUT MNCRL AB 3-0 PS2 18 (SUTURE) IMPLANT
SUT VIC AB 0 CT1 27 (SUTURE) ×2
SUT VIC AB 0 CT1 27XBRD ANBCTR (SUTURE) ×2 IMPLANT
SUT VIC AB 2-0 CT1 27 (SUTURE) ×2
SUT VIC AB 2-0 CT1 TAPERPNT 27 (SUTURE) ×2 IMPLANT
SUT VLOC 180 0 24IN GS25 (SUTURE) ×2 IMPLANT
SYR 50ML LL SCALE MARK (SYRINGE) ×2 IMPLANT
TOWEL OR 17X24 6PK STRL BLUE (TOWEL DISPOSABLE) ×2 IMPLANT
TOWEL OR 17X26 10 PK STRL BLUE (TOWEL DISPOSABLE) ×2 IMPLANT
TRAY FOLEY CATH 14FR (SET/KITS/TRAYS/PACK) IMPLANT
WATER STERILE IRR 1000ML POUR (IV SOLUTION) IMPLANT

## 2014-02-19 NOTE — Progress Notes (Signed)
Utilization review completed.  

## 2014-02-19 NOTE — Transfer of Care (Signed)
Immediate Anesthesia Transfer of Care Note  Patient: Randall AspEdward N [name]  Procedure(s) Performed: Procedure(s): TOTAL KNEE ARTHROPLASTY (Right)  Patient Location: PACU  Anesthesia Type:General  Level of Consciousness: awake and sedated  Airway & Oxygen Therapy: Patient Spontanous Breathing and Patient connected to nasal cannula oxygen  Post-op Assessment: Report given to PACU RN and Post -op Vital signs reviewed and stable  Post vital signs: Reviewed and stable  Complications: No apparent anesthesia complications

## 2014-02-19 NOTE — Op Note (Signed)
PREOP DIAGNOSIS: DJD RIGHT KNEE POSTOP DIAGNOSIS: same PROCEDURE: RIGHT TKR ANESTHESIA: General and block ATTENDING SURGEON: Skyy Nilan G ASSISTANT: Elodia FlorenceAndrew Nida PA and Lindwood QuaMichael Carnaghi PA  INDICATIONS FOR PROCEDURE: Randall [name] is a 73 y.o. male who has struggled for a long time with pain due to degenerative arthritis of the right knee.  The patient has failed many conservative non-operative measures and at this point has pain which limits the ability to sleep and walk.  The patient is offered total knee replacement.  Informed operative consent was obtained after discussion of possible risks of anesthesia, infection, neurovascular injury, DVT, and death.  The importance of the post-operative rehabilitation protocol to optimize result was stressed extensively with the patient.  SUMMARY OF FINDINGS AND PROCEDURE:  Randall [name] was taken to the operative suite where under the above anesthesia a right knee replacement was performed.  There were advanced degenerative changes and the bone quality was excellent.  We used the DePuy system and placed size standard plus femur, 4 tibia, 38 mm all polyethylene patella, and a size 10 mm spacer.  Elodia FlorenceAndrew Nida PA-C assisted throughout and was invaluable to the completion of the case in that he helped retract and maintain exposure while I placed components.  He also helped close thereby minimizing OR time.  The patient was admitted for appropriate post-op care to include perioperative antibiotics and mechanical and pharmacologic measures for DVT prophylaxis.  DESCRIPTION OF PROCEDURE:  Randall [name] was taken to the operative suite where the above anesthesia was applied.  The patient was positioned supine and prepped and draped in normal sterile fashion.  An appropriate time out was performed.  After the administration of Kefzol pre-op antibiotic the leg was elevated and exsanguinated and a tourniquet inflated. A standard longitudinal incision was made on  the anterior knee.  Dissection was carried down to the extensor mechanism.  All appropriate anti-infective measures were used including the pre-operative antibiotic, betadine impregnated drape, and closed hooded exhaust systems for each member of the surgical team.  A medial parapatellar incision was made in the extensor mechanism and the knee cap flipped and the knee flexed.  Some residual meniscal tissues were removed along with any remaining ACL/PCL tissue.  A guide was placed on the tibia and a flat cut was made on it's superior surface.  An intramedullary guide was placed in the femur and was utilized to make anterior and posterior cuts creating an appropriate flexion gap.  A second intramedullary guide was placed in the femur to make a distal cut properly balancing the knee with an extension gap equal to the flexion gap.  The three bones sized to the above mentioned sizes and the appropriate guides were placed and utilized.  A trial reduction was done and the knee easily came to full extension and the patella tracked well on flexion.  The trial components were removed and all bones were cleaned with pulsatile lavage and then dried thoroughly.  Cement was mixed and was pressurized onto the bones followed by placement of the aforementioned components.  Excess cement was trimmed and pressure was held on the components until the cement had hardened.  The tourniquet was deflated and a small amount of bleeding was controlled with cautery and pressure.  The knee was irrigated thoroughly.  The extensor mechanism was re-approximated with V-loc suture in running fashion.  The knee was flexed and the repair was solid.  The subcutaneous tissues were re-approximated with #0 and #2-0 vicryl and the  skin closed with a subcuticular stitch and steristrips.  A sterile dressing was applied.  Intraoperative fluids, EBL, and tourniquet time can be obtained from anesthesia records.  DISPOSITION:  The patient was taken to recovery  room in stable condition and admitted for appropriate post-op care to include peri-operative antibiotic and DVT prophylaxis with mechanical and pharmacologic measures.  Shalana Jardin G 02/19/2014, 9:36 AM

## 2014-02-19 NOTE — Anesthesia Preprocedure Evaluation (Addendum)
Anesthesia Evaluation  Patient identified by MRN, date of birth, ID band Patient awake    Reviewed: Allergy & Precautions, H&P , NPO status , Patient's Chart, lab work & pertinent test results  Airway Mallampati: II TM Distance: >3 FB Neck ROM: Full    Dental   Pulmonary  breath sounds clear to auscultation        Cardiovascular Rhythm:Regular Rate:Normal     Neuro/Psych Anxiety Parkinsonism  Neuromuscular disease    GI/Hepatic (+)     substance abuse  alcohol use,   Endo/Other  diabetes  Renal/GU      Musculoskeletal   Abdominal   Peds  Hematology   Anesthesia Other Findings   Reproductive/Obstetrics                          Anesthesia Physical Anesthesia Plan  ASA: III  Anesthesia Plan: Spinal   Post-op Pain Management: MAC Combined w/ Regional for Post-op pain   Induction:   Airway Management Planned: Simple Face Mask  Additional Equipment:   Intra-op Plan:   Post-operative Plan:   Informed Consent: I have reviewed the patients History and Physical, chart, labs and discussed the procedure including the risks, benefits and alternatives for the proposed anesthesia with the patient or authorized representative who has indicated his/her understanding and acceptance.     Plan Discussed with: CRNA and Surgeon  Anesthesia Plan Comments:         Anesthesia Quick Evaluation

## 2014-02-19 NOTE — Interval H&P Note (Signed)
History and Physical Interval Note:  02/19/2014 6:28 AM  Randall AspEdward N [name]  has presented today for surgery, with the diagnosis of RIGHT KNEE DEGENERATIVE JOINT DISEASE  The various methods of treatment have been discussed with the patient and family. After consideration of risks, benefits and other options for treatment, the patient has consented to  Procedure(s): TOTAL KNEE ARTHROPLASTY (Right) as a surgical intervention .  The patient's history has been reviewed, patient examined, no change in status, stable for surgery.  I have reviewed the patient's chart and labs.  Questions were answered to the patient's satisfaction.     Aleigh Grunden G

## 2014-02-19 NOTE — Evaluation (Signed)
Physical Therapy Evaluation Patient Details Name: Randall [name] MRN: [id number] DOB: 11-12-1941 Today's Date: 02/19/2014 Time: 4098-1191 PT Time Calculation (min): 28 min  PT Assessment / Plan / Recommendation History of Present Illness  s/p R TKA with history of Parkinson's Disease  Clinical Impression  Pt is s/p Right TKA resulting in the deficits listed below (see PT Problem List). Pt and wife indicate pt with some previous impairments prior to surgery due to history of parkinson's disease. Pt will benefit from skilled PT to increase their independence and safety with mobility to allow discharge to the venue listed below.      PT Assessment  Patient needs continued PT services    Follow Up Recommendations  SNF;Supervision/Assistance - 24 hour    Does the patient have the potential to tolerate intense rehabilitation      Barriers to Discharge Other (comment) (History of parkinsons disease ) Pt may need more assistance than wife is able to give    Equipment Recommendations  3in1 (PT)    Recommendations for Other Services OT consult   Frequency 7X/week    Precautions / Restrictions Precautions Precautions: Knee Precaution Booklet Issued: No Required Braces or Orthoses: Knee Immobilizer - Right Restrictions Weight Bearing Restrictions: Yes RLE Weight Bearing: Weight bearing as tolerated   Pertinent Vitals/Pain 2/10 pain Nausea upon standing Nurse notified Pt positioned in reclining chair for comfort; Knee positioned for optimal extension      Mobility  Bed Mobility Overal bed mobility: +2 for physical assistance;Needs Assistance Bed Mobility: Supine to Sit Supine to sit: Mod assist;+2 for physical assistance;HOB elevated General bed mobility comments: Mod assist +2 for trunk control and to support RLE with supine>sit. Transfers Overall transfer level: Needs assistance Equipment used: Rolling walker (2 wheeled) Transfers: Sit to/from [employer] Sit to Stand: Mod assist;+2 safety/equipment Stand pivot transfers: Min assist;+2 safety/equipment General transfer comment: Pt needs Mod assist for sit>stand. Verbal cues for foot and hand placement. Stand pivot transfer with Min A +2 for safety, assit with RW placement and sequencing. Cue pt for foot position as he demonstrates a narrow BOS causing LOB to posterior and right needing Min assist to correct.    Exercises Total Joint Exercises Ankle Circles/Pumps: AROM;Both;10 reps;Supine Quad Sets: AROM;Right;10 reps;Supine   PT Diagnosis: Difficulty walking;Abnormality of gait;Generalized weakness;Acute pain  PT Problem List: Decreased strength;Decreased range of motion;Decreased activity tolerance;Decreased balance;Decreased mobility;Decreased knowledge of use of DME;Decreased safety awareness;Decreased knowledge of precautions;Pain PT Treatment Interventions: DME instruction;Gait training;Stair training;Functional mobility training;Therapeutic activities;Therapeutic exercise;Balance training;Neuromuscular re-education;Patient/family education;Modalities     PT Goals(Current goals can be found in the care plan section) Acute Rehab PT Goals Patient Stated Goal: Go home PT Goal Formulation: With patient/family Time For Goal Achievement: 02/26/14 Potential to Achieve Goals: Fair (History of parkinson's disease)  Visit Information  Last PT Received On: 02/19/14 Assistance Needed: +2 History of Present Illness: s/p R TKA with history of Parkinson's Disease       Prior Functioning  Home Living Family/patient expects to be discharged to:: Private residence Living Arrangements: Spouse/significant other Available Help at Discharge: Skilled Nursing Facility;Family Type of Home: House Home Access: Stairs to enter [employer] of Steps: 2 Entrance Stairs-Rails: None Home Layout: One level Home Equipment: Cane - single point;Walker - 2 wheels Prior Function Level of  Independence: Needs assistance Gait / Transfers Assistance Needed: no assist needed for mobility ADL's / Homemaking Assistance Needed: Wife assists with socks and shoes Comments: "known to climb ladders on occasion" Communication  Communication: No difficulties Dominant Hand: Right    Cognition  Cognition Arousal/Alertness: Lethargic;Suspect due to medications Behavior During Therapy: WFL for tasks assessed/performed Overall Cognitive Status: Within Functional Limits for tasks assessed    Extremity/Trunk Assessment Upper Extremity Assessment Upper Extremity Assessment: Overall WFL for tasks assessed Lower Extremity Assessment Lower Extremity Assessment: RLE deficits/detail RLE Deficits / Details: R knee in flexion, unable to fully extend - possible contracture from parkinson's disease - wife and pt state there was already a slight amount of flexion prior to suregery RLE: Unable to fully assess due to immobilization   Balance Balance Overall balance assessment: Needs assistance Sitting-balance support: Bilateral upper extremity supported Sitting balance-Leahy Scale: Poor Sitting balance - Comments: EOB with Min assist for balance Standing balance support: Bilateral upper extremity supported Standing balance-Leahy Scale: Poor Standing balance comment: Needs RW for UE support in standing General Comments General comments (skin integrity, edema, etc.): Pt with noticable blood coming through bandage, nurse notified. Pt with nausea upon standing, no emesis.  End of Session PT - End of Session Equipment Utilized During Treatment: Gait belt;Right knee immobilizer Activity Tolerance: Patient limited by lethargy Patient left: in chair;with call bell/phone within reach;with family/visitor present Nurse Communication: Mobility status;Other (comment) (Blood from surgical site) CPM Right Knee CPM Right Knee: On Right Knee Flexion (Degrees): 60 Right Knee Extension (Degrees): 0 Additional  Comments: Trapeze bar  GP    Charlsie MerlesLogan Secor Ladeidra Borys, PT [phone]  Berton MountBarbour, Lyall Faciane S 02/19/2014, 3:47 PM

## 2014-02-19 NOTE — Anesthesia Procedure Notes (Addendum)
Anesthesia Regional Block:  Femoral nerve block  Pre-Anesthetic Checklist: ,, timeout performed, Correct Patient, Correct Site, Correct Laterality, Correct Procedure, Correct Position, site marked, Risks and benefits discussed, Surgical consent,  Pre-op evaluation,  Post-op pain management  Laterality: Right  Prep: chloraprep       Needles:  Injection technique: Single-shot  Needle Type: Echogenic Stimulator Needle     Needle Length: 9cm 9 cm Needle Gauge: 22 and 22 G    Additional Needles:  Procedures: nerve stimulator Femoral nerve block  Nerve Stimulator or Paresthesia:  Response: 0.48 mA,   Additional Responses:   Narrative:  Start time: 02/19/2014 7:06 AM End time: 02/19/2014 7:18 AM Injection made incrementally with aspirations every 5 mL. Anesthesiologist: Dr Gypsy Balsamkasik  Additional Notes: 9528-4132: 0706-0718 R FNB POP CHG prep, sterile tech #22 stim/echo needle with stim down to .48ma Multiple neg asp Marc .5% w/epi 1:200000 total 25cc+ decadron 4mg  infiltrated No compl Dr Gypsy BalsamKasik   Procedure Name: LMA Insertion Date/Time: 02/19/2014 7:57 AM Performed by: Cadie Sorci, Jannet AskewHARLESETTA M Pre-anesthesia Checklist: Patient identified, Timeout performed, Emergency Drugs available, Patient being monitored and Suction available Patient Re-evaluated:Patient Re-evaluated prior to inductionOxygen Delivery Method: Circle system utilized Preoxygenation: Pre-oxygenation with 100% oxygen Intubation Type: IV induction LMA Size: 5.0 Number of attempts: 1

## 2014-02-19 NOTE — Anesthesia Postprocedure Evaluation (Signed)
  Anesthesia Post-op Note  Patient: Randall [name]  Procedure(s) Performed: Procedure(s): TOTAL KNEE ARTHROPLASTY (Right)  Patient Location: PACU  Anesthesia Type:General and GA combined with regional for post-op pain  Level of Consciousness: awake  Airway and Oxygen Therapy: Patient Spontanous Breathing  Post-op Pain: none  Post-op Assessment: Post-op Vital signs reviewed, Patient's Cardiovascular Status Stable, Respiratory Function Stable, Patent Airway, No signs of Nausea or vomiting and Pain level controlled  Post-op Vital Signs: Reviewed and stable  Complications: No apparent anesthesia complications

## 2014-02-19 NOTE — Progress Notes (Signed)
Orthopedic Tech Progress Note Patient Details:  Randall [name] [birth year] [id number]  CPM Right Knee CPM Right Knee: On Right Knee Flexion (Degrees): 60 Right Knee Extension (Degrees): 0 Additional Comments: Trapeze bar   Cammer, Mickie BailJennifer Carol 02/19/2014, 12:07 PM

## 2014-02-20 LAB — BASIC METABOLIC PANEL
BUN: 12 mg/dL (ref 6–23)
CHLORIDE: 103 meq/L (ref 96–112)
CO2: 24 mEq/L (ref 19–32)
CREATININE: 0.58 mg/dL (ref 0.50–1.35)
Calcium: 8.2 mg/dL — ABNORMAL LOW (ref 8.4–10.5)
GFR calc non Af Amer: >90 mL/min (ref >90)
Glucose, Bld: 185 mg/dL — ABNORMAL HIGH (ref 70–99)
Potassium: 4.2 mEq/L (ref 3.7–5.3)
Sodium: 140 mEq/L (ref 137–147)

## 2014-02-20 LAB — GLUCOSE, CAPILLARY
GLUCOSE-CAPILLARY: 265 mg/dL — AB (ref 70–99)
GLUCOSE-CAPILLARY: 197 mg/dL — AB (ref 70–99)
GLUCOSE-CAPILLARY: 90 mg/dL (ref 70–99)
Glucose-Capillary: 160 mg/dL — ABNORMAL HIGH (ref 70–99)
Glucose-Capillary: 256 mg/dL — ABNORMAL HIGH (ref 70–99)

## 2014-02-20 LAB — CBC
HEMATOCRIT: 27.4 % — AB (ref 39.0–52.0)
Hemoglobin: 9.6 g/dL — ABNORMAL LOW (ref 13.0–17.0)
MCH: 33.8 pg (ref 26.0–34.0)
MCHC: 35 g/dL (ref 30.0–36.0)
MCV: 96.5 fL (ref 78.0–100.0)
PLATELETS: 177 10*3/uL (ref 150–400)
RBC: 2.84 MIL/uL — ABNORMAL LOW (ref 4.22–5.81)
RDW: 14.6 % (ref 11.5–15.5)
WBC: 10.2 10*3/uL (ref 4.0–10.5)

## 2014-02-20 NOTE — Progress Notes (Signed)
Subjective: 1 Day Post-Op Procedure(s) (LRB): TOTAL KNEE ARTHROPLASTY (Right)  Activity level:  wbat Diet tolerance:  Eating well Voiding:  ok Patient reports pain as 2 on 0-10 scale.    Objective: Vital signs in last 24 hours: Temp:  [97.5 F (36.4 C)-98.3 F (36.8 C)] 97.7 F (36.5 C) (03/18 0405) Pulse Rate:  [79-93] 81 (03/18 0405) Resp:  [11-18] 18 (03/18 0405) BP: (126-149)/(58-81) 128/58 mmHg (03/18 0405) SpO2:  [91 %-98 %] 93 % (03/18 0405) Weight:  [89.8 kg (197 lb 15.6 oz)] 89.8 kg (197 lb 15.6 oz) (03/17 1458)  Labs:  Recent Labs  02/20/14 0605  HGB 9.6*    Recent Labs  02/20/14 0605  WBC 10.2  RBC 2.84*  HCT 27.4*  PLT 177    Recent Labs  02/20/14 0605  NA 140  K 4.2  CL 103  CO2 24  BUN 12  CREATININE 0.58  GLUCOSE 185*  CALCIUM 8.2*   No results found for this basename: LABPT, INR,  in the last 72 hours  Physical Exam:  Neurologically intact ABD soft Neurovascular intact Sensation intact distally Dorsiflexion/Plantar flexion intact Incision: dressing C/D/I  Assessment/Plan:  1 Day Post-Op Procedure(s) (LRB): TOTAL KNEE ARTHROPLASTY (Right) Advance diet Up with therapy D/C IV fluids Plan for discharge tomorrow Discharge to SNF tomorrow if possible and doing well Continue ASA 325mg  BID x 2 weeks Dressing change today to mepilex We will continue to monitor Hgb    Aristeo Hankerson PAUL 02/20/2014, 8:15 AM

## 2014-02-20 NOTE — Progress Notes (Signed)
Physical Therapy Treatment Patient Details Name: Randall [name] MRN: [id number] DOB: 08/29/1941 Today's Date: 02/20/2014 Time: 4098-1191 PT Time Calculation (min): 46 min  PT Assessment / Plan / Recommendation  History of Present Illness s/p R TKA with history of Parkinson's Disease   PT Comments   Pt progressing well towards goals, ambulates up to 30 feet with Min assist for occasional loss of balance. Pt is tolerating WB quite well through RLE and no evidence of knee buckling with knee immobilizer in place. Will continue to benefit from skilled PT services.   Follow Up Recommendations  SNF;Supervision/Assistance - 24 hour     Does the patient have the potential to tolerate intense rehabilitation     Barriers to Discharge        Equipment Recommendations  3in1 (PT)    Recommendations for Other Services OT consult  Frequency 7X/week   Progress towards PT Goals Progress towards PT goals: Progressing toward goals  Plan Current plan remains appropriate    Precautions / Restrictions Precautions Precautions: Knee Precaution Booklet Issued: Yes (comment) Precaution Comments: Precaution booklet issued and reviewed Required Braces or Orthoses: Knee Immobilizer - Right Restrictions Weight Bearing Restrictions: Yes RLE Weight Bearing: Weight bearing as tolerated   Pertinent Vitals/Pain 8/10 Nurse notified and administered pain medications during physical therapy treatment Pt repositioned for comfort in reclining chair; R leg positioned for optimal extension.    Mobility  Bed Mobility Overal bed mobility: Needs Assistance Bed Mobility: Supine to Sit Supine to sit: Min assist General bed mobility comments: Min assist for RLE support off of bed. Pt requires extra time and cues for technique. Heavy reliance on rail. HOB was flat Transfers Overall transfer level: Needs assistance Equipment used: Rolling walker (2 wheeled) Transfers: Sit to/from Stand Sit to Stand: Mod  assist General transfer comment: Pt needs Mod assist for sit>stand with verbal cues for hand placement and physical assist for lift and to steady RW. Pt with heavy posterior lean uponstanding, needs cues for foot placement to increase BOS. Ambulation/Gait Ambulation/Gait assistance: Min assist;+2 safety/equipment Ambulation Distance (Feet): 30 Feet Assistive device: Rolling walker (2 wheeled) Gait Pattern/deviations: Step-to pattern;Step-through pattern;Decreased stance time - right;Leaning posteriorly Gait velocity: Decreased General Gait Details: Pt requires Min assist for ambulation with control for RW, verbal cues for sequencing and a second person to follow with chair. Pt leans posteriorly and has LOB to posterior and to right requiring Min assist to correct balance. Verbal/tactile cues to initiate R knee extension in stance phase.    Exercises Total Joint Exercises Ankle Circles/Pumps: AROM;Both;10 reps;Supine Quad Sets: AROM;Right;10 reps;Supine Heel Slides: AAROM;Right;10 reps;Seated Straight Leg Raises: AROM;Both;10 reps;Supine Goniometric ROM: 20-86 degrees right knee flexion   PT Diagnosis:    PT Problem List:   PT Treatment Interventions:     PT Goals (current goals can now be found in the care plan section) Acute Rehab PT Goals PT Goal Formulation: With patient/family Time For Goal Achievement: 02/26/14 Potential to Achieve Goals: Fair (History of parkinson's disease)  Visit Information  Last PT Received On: 02/20/14 Assistance Needed: +1 History of Present Illness: s/p R TKA with history of Parkinson's Disease    Subjective Data  Subjective: Feeling more alert today.   Cognition  Cognition Arousal/Alertness: Lethargic;Suspect due to medications Behavior During Therapy: [facility] for tasks assessed/performed Overall Cognitive Status: Within Functional Limits for tasks assessed    Balance     End of Session PT - End of Session Equipment Utilized During Treatment:  Gait belt;Right knee  immobilizer Activity Tolerance: Patient tolerated treatment well Patient left: in chair;with call bell/phone within reach;with family/visitor present Nurse Communication: Mobility status;Patient requests pain meds   GP    [facility] Sec[facility] [place] Oak[place], [place] CarolinaPT [phone]  [place] MountBarbour, Elisea Khader S 02/20/2014, 9:37 AM

## 2014-02-20 NOTE — Plan of Care (Signed)
Problem: Consults Goal: Diagnosis- Total Joint Replacement Outcome: Completed/Met Date Met:  02/20/14 Primary Total Knee Right

## 2014-02-20 NOTE — Progress Notes (Signed)
Physical Therapy Treatment Patient Details Name: Randall [name] MRN: [id number] DOB: 08-09-1941 Today's Date: 02/20/2014 Time: 9562-13081411-1444 PT Time Calculation (min): 33 min  PT Assessment / Plan / Recommendation  History of Present Illness s/p R TKA with history of Parkinson's Disease   PT Comments   Pt is progressing well towards goals, improving his independence with mobility. Continue to recommend skilled PT to focus on gait training and transfers for safety. Pt will benefit from additional skilled therapy in SNF upon d/c from hospital.   Follow Up Recommendations  SNF;Supervision/Assistance - 24 hour     Does the patient have the potential to tolerate intense rehabilitation     Barriers to Discharge        Equipment Recommendations  3in1 (PT)    Recommendations for Other Services OT consult  Frequency 7X/week   Progress towards PT Goals Progress towards PT goals: Progressing toward goals  Plan Current plan remains appropriate    Precautions / Restrictions Precautions Precautions: Knee Required Braces or Orthoses: Knee Immobilizer - Right Restrictions Weight Bearing Restrictions: Yes RLE Weight Bearing: Weight bearing as tolerated   Pertinent Vitals/Pain Pain reported at 8/10 Nurse aware and administered medication during therapy Pt repositioned in chair for comfort    Mobility  Transfers Overall transfer level: Needs assistance Equipment used: Rolling walker (2 wheeled) Transfers: Sit to/from Stand Sit to Stand: Min assist General transfer comment: Min assist with sit>stand, verbal cues for hand placement and foot placement to increase BOS upon standing. Pt Min assist for [facility] transfer sit<>stand with verbal cues, and wife educated on safe handling/guarding techiniques Ambulation/Gait Ambulation/Gait assistance: Min assist Ambulation Distance (Feet): 20 Feet (additional distance of 10 feet after toileting) Assistive device: Rolling walker (2 wheeled) Gait  Pattern/deviations: Step-to pattern;Decreased stance time - right;Trunk flexed;Decreased step length - right;Decreased step length - left Gait velocity: Decreased General Gait Details: Min assist to control RW, verbal cues for sequencing. Pt began to demonstrate improved speed and  step length towards end of therapy session. Improved balance compared to this AM session.    Exercises Total Joint Exercises Ankle Circles/Pumps: AROM;Both;10 reps;Supine Quad Sets: AROM;Right;10 reps;Supine Heel Slides: AAROM;Right;Seated;5 reps Long Arc Quad: AAROM;Right;5 reps;Seated   PT Diagnosis:    PT Problem List:   PT Treatment Interventions:     PT Goals (current goals can now be found in the care plan section) Acute Rehab PT Goals PT Goal Formulation: With patient/family Time For Goal Achievement: 02/26/14 Potential to Achieve Goals: Fair (History of parkinson's disease)  Visit Information  Last PT Received On: 02/20/14 Assistance Needed: +1 History of Present Illness: s/p R TKA with history of Parkinson's Disease    Subjective Data  Subjective: Ready to work with therapy, would like to use the restroom   Cognition  Cognition Arousal/Alertness: Awake/alert Behavior During Therapy: WFL for tasks assessed/performed Overall Cognitive Status: Within Functional Limits for tasks assessed    Balance     End of Session PT - End of Session Equipment Utilized During Treatment: Gait belt Activity Tolerance: Patient tolerated treatment well Patient left: in chair;with call bell/phone within reach;with family/visitor present Nurse Communication: Mobility status   GP     [employer] CommunicationsLogan Secor [place]ro[place]bour, [place] CarolinaPT [phone]  Berton MountBarbour, Vila Dory S 02/20/2014, 3:22 PM

## 2014-02-20 NOTE — Progress Notes (Signed)
Occupational Therapy Evaluation and Discharge Patient Details Name: Randall [name] MRN: [id number] DOB: [birth year] Today's Date: 02/20/2014 Time: 8127-5170 OT Time Calculation (min): 23 min  OT Assessment / Plan / Recommendation History of present illness s/p R TKA with history of Parkinson's Disease   Clinical Impression   PTA pt lived at home with wife and required assistance for ADLs (donning/doffing socks and shoes). Pt with Hx of Parkinson's and c/o feeling disoriented and nauseous as an effect of IV medication. [name] reports that cognition was different from baseline and pt was more alert this morning. Limited evaluation completed due to pt's nausea and fatigue. Pt fatigues easily and presents with decreased activity tolerance. [name] reports that pt expects to be d/c to SNF. Pt would benefit from SNF placement to increase strength, ROM, and activity tolerance. No further acute OT needs at this time, all OT needs can be met in the next venue of care (SNF).     OT Assessment  All further OT needs can be met in the next venue of care    Follow Up Recommendations  SNF;Supervision/Assistance - 24 hour    Barriers to Discharge  Wife unable to provide amount of assistance needed by pt for transfers and mobility.     Equipment Recommendations  Other (comment) (TBD by next venue (SNF))          Precautions / Restrictions Precautions Precautions: Knee Precaution Booklet Issued: Yes (comment) Precaution Comments: Precaution booklet issued and reviewed Required Braces or Orthoses: Knee Immobilizer - Right Restrictions Weight Bearing Restrictions: Yes RLE Weight Bearing: Weight bearing as tolerated   Pertinent Vitals/Pain Pt c/o pain but did not provide pain level.     ADL  Eating/Feeding: Independent Where Assessed - Eating/Feeding: Chair Grooming: Supervision/safety;Set up Where Assessed - Grooming: Supported sitting Toilet Transfer: Moderate assistance Toilet Transfer  Method: Sit to stand Toilet Transfer Equipment: Other (comment) (sit<>stand from recliner to bed) Equipment Used: Gait belt;Rolling walker;Other (comment) (knee immobilizer (R)) ADL Comments: Wife previously assisted with socks and shoes.    OT Diagnosis: Generalized weakness;Acute pain  OT Problem List: Decreased strength;Decreased range of motion;Decreased activity tolerance;Impaired balance (sitting and/or standing);Decreased safety awareness;Decreased knowledge of use of DME or AE;Decreased knowledge of precautions;Pain      Visit Information  Last OT Received On: 02/20/14 Assistance Needed: +1 History of Present Illness: s/p R TKA with history of Parkinson's Disease       Prior Functioning     Home Living Family/patient expects to be discharged to:: Skilled nursing facility Living Arrangements: Spouse/significant other Available Help at Discharge: [place];Family Type of Home: [place]: Kasandra Knudsen - single point;Walker - 2 wheels Prior Function Level of Independence: Needs assistance ADL's / Homemaking Assistance Needed: Wife assists with socks and shoes Communication Communication: No difficulties Dominant Hand: Right            Cognition  Cognition Arousal/Alertness: Lethargic;Suspect due to medications Behavior During Therapy: [facility] for tasks assessed/performed Overall Cognitive Status: Impaired/Different from baseline Area of Impairment: Orientation (wife reports likely due to IV medication; pt c/o feeling "disoriented") Orientation Level: Situation;Place General Comments: Pt with Hx of Parkinson's. Upon entrance to room, pt c/o feeling "disoriented" and nauseous due to effect of IV medication strength. Requested to move to bed from recliner.     Extremity/Trunk Assessment Upper Extremity Assessment Upper Extremity Assessment: Generalized weakness Lower Extremity Assessment Lower Extremity Assessment: Defer to PT  evaluation     Mobility Bed Mobility  Overal bed mobility: Needs Assistance Bed Mobility: Sit to Supine Supine to sit: Min assist Sit to supine: Min assist (for RLE management) General bed mobility comments: Min assist for RLE support off of bed. Pt requires extra time and cues for technique. Heavy reliance on rail. HOB was flat Transfers Overall transfer level: Needs assistance Equipment used: Rolling walker (2 wheeled) Transfers: Sit to/from Stand Sit to Stand: Mod assist General transfer comment: Pt with improved hand placement since PT visit this morning. Required extra time for sit>stand transfers.           End of Session OT - End of Session Equipment Utilized During Treatment: Gait belt;Rolling walker;Right knee immobilizer Activity Tolerance: Patient limited by fatigue;Other (comment) (limited by nausea and disorientation as a result of IV medic) Patient left: in bed;with call bell/phone within reach;with family/visitor present CPM Right Knee CPM Right Knee: Off       Juluis Rainier [phone] 02/20/2014, 11:42 AM

## 2014-02-20 NOTE — Clinical Social Work Placement (Addendum)
Clinical Social Work Department  CLINICAL SOCIAL WORK PLACEMENT NOTE    Patient: Randall [name]  Account Number: [id number]   Admit date: 02/19/14 Clinical Social Worker: Sabino NiemannAmy Jayquan Bradsher LCSWA Date/time: 02/20/2014 2:30 PM  Clinical Social Work is seeking post-discharge placement for this patient at the following level of care: SKILLED NURSING (*CSW will update this form in Epic as items are completed)  02/20/2014 Patient/family provided with [facility] Department of Clinical Social Work's list of facilities offering this level of care within the geographic area requested by the patient (or if unable, by the patient's family).  02/20/2014 Patient/family informed of their freedom to choose among providers that offer the needed level of care, that participate in Medicare, Medicaid or managed care program needed by the patient, have an available bed and are willing to accept the patient.  02/20/2014  Patient/family informed of [facility]' ownership interest in [facility], as well as of the fact that they are under no obligation to receive care at this facility.  PASARR submitted to EDS on 02/22/2014 PASARR number received from EDS on 02/22/2014  FL2 transmitted to all facilities in geographic area requested by pt/family on 02/20/2014  FL2 transmitted to all facilities within larger geographic area on  Patient informed that his/her managed care company has contracts with or will negotiate with certain facilities, including the following:  Patient/family informed of bed offers received:  [facility] Physician recommends and patient chooses bed at  Patient to be transferred to on 02/22/2014 Patient to be transferred to facility by [facility] The following physician request were entered in Epic:  Additional Comments:

## 2014-02-20 NOTE — Care Management Note (Signed)
CARE MANAGEMENT NOTE 02/20/2014  Patient:  Caryl AspBOOKER,Renji N   Account Number:  [id number]  Date Initiated:  02/20/2014  Documentation initiated by:  Vance PeperBRADY,Dannya Pitkin  Subjective/Objective Assessment:   73 yr old male s/p right total knee arthroplasty, with Hx of Parkinsons disease.     Action/Plan:   [facility] Social worker is aware.  [facility]   Anticipated DC Date:  02/22/2014   Anticipated DC Plan:  SKILLED NURSING FACILITY      DC Planning Services  CM consult      Choice offered to / List presented to:             Status of service:  Completed, signed off Medicare Important Message given?   (If response is "NO", the following Medicare IM given date fields will be blank) Date Medicare IM given:   Date Additional Medicare IM given:    Discharge Disposition:  SKILLED NURSING FACILITY

## 2014-02-20 NOTE — Clinical Social Work Psychosocial (Signed)
Clinical Social Work Department  BRIEF PSYCHOSOCIAL ASSESSMENT  Patient: Randall [name]  Account Number: [id number]   Admit date: 02/19/14 Clinical Social Worker Rhea Pink, MSW Date/Time: 02/20/2014 2PM Referred by: Physician Date Referred: 02/20/2014  Referred for   SNF Placement   Other Referral:  Interview type: Patient's daughter. Patient was sleeping  Other interview type: PSYCHOSOCIAL DATA  Living Status: with spouse Admitted from facility:  Level of care:  Primary support name: Jesstin Studstill Primary support relationship to patient: Spouse Degree of support available:  Strong and vested  CURRENT CONCERNS  Current Concerns   Post-Acute Placement   Other Concerns:  SOCIAL WORK ASSESSMENT / PLAN  CSW met with pt re: PT recommendation for SNF.   Pt lives with his wife  CSW explained placement process and answered questions.   Pt's reports [facility]  as her preference    CSW completed FL2 and initiated SNF search.     Assessment/plan status: Information/Referral to [employer]  Other assessment/ plan:  Information/referral to community resources:  SNF   PTAR  PATIENT'S/FAMILY'S RESPONSE TO PLAN OF CARE:  Pt's daughter  Reports that the patient is agreeable  agreeable to ST SNF in order to increase strength and independence with mobility prior to returning home  Pt verbalized understanding of placement process and appreciation for CSW assist.   Rhea Pink, MSW [phone]

## 2014-02-21 DIAGNOSIS — G20 Parkinson's disease: Secondary | ICD-10-CM | POA: Diagnosis present

## 2014-02-21 DIAGNOSIS — G20A1 Parkinson's disease without dyskinesia, without mention of fluctuations: Secondary | ICD-10-CM | POA: Diagnosis present

## 2014-02-21 LAB — CBC
HCT: 26.9 % — ABNORMAL LOW (ref 39.0–52.0)
HEMOGLOBIN: 9.6 g/dL — AB (ref 13.0–17.0)
MCH: 34.4 pg — ABNORMAL HIGH (ref 26.0–34.0)
MCHC: 35.7 g/dL (ref 30.0–36.0)
MCV: 96.4 fL (ref 78.0–100.0)
Platelets: 204 10*3/uL (ref 150–400)
RBC: 2.79 MIL/uL — AB (ref 4.22–5.81)
RDW: 14.8 % (ref 11.5–15.5)
WBC: 11.6 10*3/uL — AB (ref 4.0–10.5)

## 2014-02-21 LAB — GLUCOSE, CAPILLARY
GLUCOSE-CAPILLARY: 46 mg/dL — AB (ref 70–99)
Glucose-Capillary: 54 mg/dL — ABNORMAL LOW (ref 70–99)
Glucose-Capillary: 91 mg/dL (ref 70–99)
Glucose-Capillary: 60 mg/dL — ABNORMAL LOW (ref 70–99)
Glucose-Capillary: 66 mg/dL — ABNORMAL LOW (ref 70–99)
Glucose-Capillary: 78 mg/dL (ref 70–99)
Glucose-Capillary: 129 mg/dL — ABNORMAL HIGH (ref 70–99)

## 2014-02-21 MED ORDER — ASPIRIN 325 MG PO TBEC: 325.0000 mg | DELAYED_RELEASE_TABLET | Freq: Two times a day (BID) | ORAL | Status: DC

## 2014-02-21 MED ORDER — METHOCARBAMOL 500 MG PO TABS: 500.0000 mg | ORAL_TABLET | Freq: Four times a day (QID) | ORAL | Status: DC | PRN

## 2014-02-21 MED ORDER — FERROUS SULFATE 325 (65 FE) MG PO TABS: 325.0000 mg | ORAL_TABLET | Freq: Every day | ORAL | Status: DC

## 2014-02-21 MED ORDER — HYDROCODONE-ACETAMINOPHEN 5-325 MG PO TABS: 1.0000 | ORAL_TABLET | ORAL | Status: DC | PRN

## 2014-02-21 NOTE — Discharge Instructions (Signed)
Ice elevation Change dressing as needed Weightbearing as tolerated ASA 325 one twice a day for 2 weeks. Continue CPM as tolerated Return to office 2 weeks.

## 2014-02-21 NOTE — Progress Notes (Signed)
Physical Therapy Treatment Patient Details Name: Randall [name] MRN: [id number] DOB: [birth year] Today's Date: 02/21/2014 Time: 1445-1500 PT Time Calculation (min): 15 min  PT Assessment / Plan / Recommendation  History of Present Illness s/p R TKA with history of Parkinson's Disease   PT Comments   Pt tolerated treatment well; ambulates up to 50 feet with min guard but still needs cues for safe handling of rolling walker. Pt will benefit from physical therapy in skilled nursing facility to improve independence with functional mobility. PT will continue to follow in acute setting until D/c.   Follow Up Recommendations  SNF;Supervision/Assistance - 24 hour     Does the patient have the potential to tolerate intense rehabilitation     Barriers to Discharge        Equipment Recommendations  3in1 (PT)    Recommendations for Other Services OT consult  Frequency 7X/week   Progress towards PT Goals Progress towards PT goals: Progressing toward goals  Plan Current plan remains appropriate    Precautions / Restrictions Precautions Precautions: Knee Required Braces or Orthoses: Knee Immobilizer - Right Restrictions Weight Bearing Restrictions: Yes RLE Weight Bearing: Weight bearing as tolerated   Pertinent Vitals/Pain 2/10 pain Pt repositioned in bed for comfort    Mobility  Bed Mobility Overal bed mobility: Needs Assistance Bed Mobility: Sit to Supine Supine to sit: Min guard General bed mobility comments: Min guard for safety. Pt requires extra time. Was able to bring RLE off of bed without assist. Verbal cues for technique Transfers Overall transfer level: Needs assistance Equipment used: Rolling walker (2 wheeled) Transfers: Sit to/from Stand Sit to Stand: Min assist General transfer comment: Min assist to control RW and verbal cues for foot and hand placement. Pt initially stands with narrow BOS and is off balance until adjusts feet. Ambulation/Gait Ambulation/Gait  assistance: Min guard Ambulation Distance (Feet): 50 Feet Assistive device: Rolling walker (2 wheeled) Gait Pattern/deviations: Step-through pattern;Antalgic;Narrow base of support;Trunk flexed Gait velocity: Decreased General Gait Details: Frequent verbal cues for walker placement inside of BOS. PT focused on step-through gait pattern. Verbalizes correct technique for gait with RW however still needs reinforcement and reminders to demonstrate at all times    Exercises     PT Diagnosis:    PT Problem List:   PT Treatment Interventions:     PT Goals (current goals can now be found in the care plan section) Acute Rehab PT Goals PT Goal Formulation: With patient/family Time For Goal Achievement: 02/26/14 Potential to Achieve Goals: Fair (History of parkinson's disease)  Visit Information  Last PT Received On: 02/21/14 Assistance Needed: +1 History of Present Illness: s/p R TKA with history of Parkinson's Disease    Subjective Data  Subjective: Pt states he just got back into bed but is willing to work with therapy   Cognition  Cognition Arousal/Alertness: Awake/alert Behavior During Therapy: WFL for tasks assessed/performed Overall Cognitive Status: Within Functional Limits for tasks assessed    Balance     End of Session PT - End of Session Equipment Utilized During Treatment: Gait belt Activity Tolerance: Patient tolerated treatment well Patient left: with call bell/phone within reach;with family/visitor present;in bed Nurse Communication: Mobility status   GP    [facility] [place], [place] [phone]   [place] Mount[place], Tameaka Eichhorn S 02/21/2014, 3:39 PM

## 2014-02-21 NOTE — Progress Notes (Signed)
Hypoglycemic Event  CBG: 54  Treatment: 15 GM carbohydrate snack  Symptoms: None  Follow-up CBG: Time: 0700 CBG Result :91  Possible Reasons for Event: Inadequate meal intake and Medication regimen: CBG 90 at bedtime, 78U Levemir given at ordered, HS snack consumed (graham crackers with peanut butter)  Comments/MD notified:  Continue to monitor, not symptomatic, treatment effective    Randall [name], Damien FusiKimberly Phillips  Remember to initiate Hypoglycemia Order Set & complete

## 2014-02-21 NOTE — Discharge Summary (Deleted)
Patient ID: Randall [name] MRN: [id number] DOB/AGE: 74-Jul-1942 74 y.o.  Admit date: 02/19/2014 Discharge date: 02/22/2014  Admission Diagnoses:  Principal Problem:   Right knee DJD Active Problems:   Diabetes   Parkinson disease   Discharge Diagnoses:  Same  Past Medical History  Diagnosis Date  . Hyperlipidemia   . Parkinson disease   . Incontinence of urine     DR  RON   DAVIS    LOV FEB. 2015  . Neuromuscular disorder   . Arthritis   . Diabetes mellitus without complication   . Anxiety   . RSD (reflex sympathetic dystrophy)     2005    Surgeries: Procedure(s): TOTAL KNEE ARTHROPLASTY on 02/19/2014   Consultants:    Discharged Condition: Improved  Hospital Course: Randall [name] is an 73 y.o. male who was admitted 02/19/2014 for operative treatment ofRight knee DJD. Patient has severe unremitting pain that affects sleep, daily activities, and work/hobbies. After pre-op clearance the patient was taken to the operating room on 02/19/2014 and underwent  Procedure(s): TOTAL KNEE ARTHROPLASTY.    Patient was given perioperative antibiotics: Anti-infectives   Start     Dose/Rate Route Frequency Ordered Stop   02/19/14 1600  ceFAZolin (ANCEF) IVPB 2 g/50 mL premix     2 g 100 mL/hr over 30 Minutes Intravenous Every 6 hours 02/19/14 1139 02/19/14 2326   02/19/14 0600  ceFAZolin (ANCEF) IVPB 2 g/50 mL premix     2 g 100 mL/hr over 30 Minutes Intravenous On call to O.R. 02/18/14 1435 02/19/14 3086       Patient was given sequential compression devices, early ambulation, and chemoprophylaxis to prevent DVT.  Patient benefited maximally from hospital stay and there were no complications.    Recent vital signs: Patient Vitals for the past 24 hrs:  BP Temp Temp src Pulse Resp SpO2  02/21/14 0632 133/52 mmHg 99 F (37.2 C) Oral 91 18 93 %  02/20/14 2020 122/59 mmHg 98.6 F (37 C) Oral 93 18 96 %  02/20/14 1412 134/62 mmHg 97.1 F (36.2 C) Oral 96 18 96 %     Recent  laboratory studies:  Recent Labs  02/20/14 0605 02/21/14 0615  WBC 10.2 11.6*  HGB 9.6* 9.6*  HCT 27.4* 26.9*  PLT 177 204  NA 140  --   K 4.2  --   CL 103  --   CO2 24  --   BUN 12  --   CREATININE 0.58  --   GLUCOSE 185*  --   CALCIUM 8.2*  --      Discharge Medications:     Medication List         aspirin 325 MG EC tablet  Take 1 tablet (325 mg total) by mouth 2 (two) times daily after a meal.     AZILECT 1 MG Tabs tablet  Generic drug:  rasagiline  Take 1 mg by mouth daily.     Carbidopa-Levodopa ER 25-100 MG tablet controlled release  Commonly known as:  SINEMET CR  Take 1.5 tablets by mouth 3 (three) times daily.     ferrous sulfate 325 (65 FE) MG tablet  Take 1 tablet (325 mg total) by mouth daily with breakfast.     furosemide 20 MG tablet  Commonly known as:  LASIX  Take 20 mg by mouth daily.     HYDROcodone-acetaminophen 5-325 MG per tablet  Commonly known as:  NORCO/VICODIN  Take 1-2 tablets by mouth every 4 (four)  hours as needed for moderate pain (breakthrough pain).     insulin detemir 100 UNIT/ML injection  Commonly known as:  LEVEMIR  Inject 78 Units into the skin 2 (two) times daily.     metFORMIN 500 MG 24 hr tablet  Commonly known as:  GLUCOPHAGE-XR  Take 500-1,000 mg by mouth 2 (two) times daily. Takes 1000mg  in the morning and 500mg  in the evening     methocarbamol 500 MG tablet  Commonly known as:  ROBAXIN  Take 1 tablet (500 mg total) by mouth every 6 (six) hours as needed for muscle spasms.     mirabegron ER 50 MG Tb24 tablet  Commonly known as:  MYRBETRIQ  Take 100 mg by mouth at bedtime.     nystatin 100000 UNIT/GM Powd  Apply topically daily as needed (for toe fungus).     OVER THE COUNTER MEDICATION  Take 100 mg by mouth daily as needed (for erectile dysfunction). *over the counter medication called Stendra*     rOPINIRole 8 MG 24 hr tablet  Commonly known as:  REQUIP XL  Take 8 mg by mouth at bedtime.      simvastatin 40 MG tablet  Commonly known as:  ZOCOR  Take 40 mg by mouth daily.     venlafaxine XR 150 MG 24 hr capsule  Commonly known as:  EFFEXOR-XR  Take 150 mg by mouth daily with breakfast.       ASA 325 one pill twice a day for 2 weeks and then return to regular ASA dose of 81 mg Dressing changed when necessary Continue CPM 0-60 and advance as tolerated 6 hours per day Return to office 2 weeks  Diagnostic Studies: Dg Chest 2 View  02/12/2014   CLINICAL DATA Preop right knee replacement  EXAM CHEST  2 VIEW  COMPARISON T-SPINE dated 11/20/2009  FINDINGS Heart size and vascular pattern are normal. Lungs are clear. There is mild bilateral pleural apical thickening with minimal calcification in the left apex. This is stable.  IMPRESSION No active cardiopulmonary disease.  SIGNATURE  Electronically Signed   By: Esperanza Heiraymond  Rubner M.D.   On: 02/12/2014 15:52    Disposition:       Discharge Orders   Future Appointments Provider Department Dept Phone   05/02/2014 2:00 PM Lenn SinkNorman S Regal, DPM [facility] [phone]   Future Orders Complete By Expires   Call MD / Call 911  As directed    Comments:     If you experience chest pain or shortness of breath, CALL 911 and be transported to the hospital emergency room.  If you develope a fever above 101 F, pus (white drainage) or increased drainage or redness at the wound, or calf pain, call your surgeon's office.   Constipation Prevention  As directed    Comments:     Drink plenty of fluids.  Prune juice may be helpful.  You may use a stool softener, such as Colace (over the counter) 100 mg twice a day.  Use MiraLax (over the counter) for constipation as needed.   Diet - low sodium heart healthy  As directed    Increase activity slowly as tolerated  As directed       Follow-up Information   Follow up with DALLDORF,PETER G, MD. Call in 2 weeks.   Specialty:  Orthopedic Surgery   Contact information:   [address] [place] [id number] [phone]        Signed: Prince RomeCARNAGHI,Darrie Macmillan R 02/21/2014,  12:22 PM

## 2014-02-21 NOTE — Progress Notes (Signed)
Physical Therapy Treatment Patient Details Name: Randall [name] MRN: [id number] DOB: [birth year] Today's Date: 02/21/2014 Time: [id number] PT Time Calculation (min): 27 min  PT Assessment / Plan / Recommendation  History of Present Illness s/p R TKA with history of Parkinson's Disease   PT Comments   Pt is progressing well towards goals and increasing his independence with functional mobility. Right knee showing good stability with ambulation and no instances of buckling. Pt will benefit from continued PT services  Follow Up Recommendations  SNF;Supervision/Assistance - 24 hour     Does the patient have the potential to tolerate intense rehabilitation     Barriers to Discharge        Equipment Recommendations  3in1 (PT)    Recommendations for Other Services OT consult  Frequency 7X/week   Progress towards PT Goals    Plan Current plan remains appropriate    Precautions / Restrictions Precautions Precautions: Knee Required Braces or Orthoses: Knee Immobilizer - Right Restrictions Weight Bearing Restrictions: Yes RLE Weight Bearing: Weight bearing as tolerated   Pertinent Vitals/Pain Pain 2/10 Nurse notified Pt repositioned in chair for comfort; knee positioned for optimal extension    Mobility  Bed Mobility Overal bed mobility: Needs Assistance Bed Mobility: Sit to Supine Supine to sit: Min guard General bed mobility comments: Min guard for safety. Pt requires extra time. Was able to bring RLE off of bed without assist this AM Transfers Overall transfer level: Needs assistance Equipment used: Rolling walker (2 wheeled) Transfers: Sit to/from Stand Sit to Stand: Min assist General transfer comment: Min assist with sit>stand, pt still requires verbal cues for hand placement and physical assist to steady RW. Pt requires extra time and leans heavily on RW initially upon standing, needs verbal cues for foot placement as he has a narrow BOS and leans minimally to R however  was able to correct without assist today. Verbal cues for hand placement for sit<>stand [facility].  Ambulation/Gait Ambulation/Gait assistance: Min guard Ambulation Distance (Feet): 20 Feet (additional bout of 15 feet to restroom) Assistive device: Rolling walker (2 wheeled) Gait Pattern/deviations: Step-to pattern;Step-through pattern;Decreased stance time - right;Trunk flexed;Antalgic Gait velocity: Decreased General Gait Details: Pt needs Min guard for short distance ambulation. Better control of RW today, verbal cues to straighten R knee in stance phase. No instances of knee buckling without knee immobilizer and shows strong control and stability. Requires extra time and cues for sequencing when turning and backing up.    Exercises Total Joint Exercises Quad Sets: AROM;Right;5 reps;Seated Heel Slides: AAROM;Right;Seated;5 reps Long Arc Quad: AAROM;Right;5 reps;Seated Goniometric ROM: 12-89 degrees R knee flexion   PT Diagnosis:    PT Problem List:   PT Treatment Interventions:     PT Goals (current goals can now be found in the care plan section) Acute Rehab PT Goals PT Goal Formulation: With patient/family Time For Goal Achievement: 02/26/14 Potential to Achieve Goals: Fair (History of parkinson's disease)  Visit Information  Last PT Received On: 02/21/14 Assistance Needed: +1 History of Present Illness: s/p R TKA with history of Parkinson's Disease    Subjective Data  Subjective: Pt would like to use restroom   Cognition  Cognition Arousal/Alertness: Awake/alert Behavior During Therapy: WFL for tasks assessed/performed Overall Cognitive Status: Within Functional Limits for tasks assessed    Balance     End of Session PT - End of Session Equipment Utilized During Treatment: Gait belt Activity Tolerance: Patient tolerated treatment well Patient left: in chair;with call bell/phone within reach;with family/visitor  present Nurse Communication: Mobility status   GP     [facility] [place], [place] [phone]   Berton Mount 02/21/2014, 11:13 AM

## 2014-02-21 NOTE — Progress Notes (Signed)
Brief Nutrition Note  RD drawn to chart secondary to current prescription of Azilect, a medication with potential interactions with high tyramine-containing foods.  Pt was on this medication PTA. Hospital diet not high in tyramine-containing foods therefore no diet modifications needed at this time.  Please re-consult as needed.   Kendell BaneHeather Katia Hannen RD, LDN, CNSC [phone] Pager [phone] After Hours Pager

## 2014-02-21 NOTE — Progress Notes (Signed)
Subjective: 2 Days Post-Op Procedure(s) (LRB): TOTAL KNEE ARTHROPLASTY (Right) Progressing as expected with physical therapy. Anticipate discharge to SNF tomorrow. Hemoglobin stable Activity level:  wbat Diet tolerance:  ok Voiding:  ok Patient reports pain as 3 on 0-10 scale.    Objective: Vital signs in last 24 hours: Temp:  [97.1 F (36.2 C)-99 F (37.2 C)] 99 F (37.2 C) (03/19 16100632) Pulse Rate:  [91-96] 91 (03/19 0632) Resp:  [18] 18 (03/19 96040632) BP: (122-134)/(52-62) 133/52 mmHg (03/19 0632) SpO2:  [93 %-96 %] 93 % (03/19 0632)  Labs:  Recent Labs  02/20/14 0605 02/21/14 0615  HGB 9.6* 9.6*    Recent Labs  02/20/14 0605 02/21/14 0615  WBC 10.2 11.6*  RBC 2.84* 2.79*  HCT 27.4* 26.9*  PLT 177 204    Recent Labs  02/20/14 0605  NA 140  K 4.2  CL 103  CO2 24  BUN 12  CREATININE 0.58  GLUCOSE 185*  CALCIUM 8.2*   No results found for this basename: LABPT, INR,  in the last 72 hours  Physical Exam:  Neurologically intact ABD soft Neurovascular intact Sensation intact distally Intact pulses distally Dorsiflexion/Plantar flexion intact Incision: dressing C/D/I No cellulitis present Compartment soft  Assessment/Plan:  2 Days Post-Op Procedure(s) (LRB): TOTAL KNEE ARTHROPLASTY (Right) Advance diet Up with therapy D/C IV fluids Discharge to SNF on Friday if bed available Continue ASA 325 one twice a day x2 weeks. May change dressing when necessary. Weightbearing as tolerated. FL-2 and prescriptions are signed and on the chart. Continue CPM as tolerated 6-8 hours per day    Majid Mccravy R 02/21/2014, 12:12 PM

## 2014-02-21 NOTE — Progress Notes (Signed)
Hypoglycemic Event  CBG: 46   Treatment: 15 GM carbohydrate snack  Symptoms: Shaky  Follow-up CBG: Time:2320 CBG Result:129  Possible Reasons for Event: Inadequate meal intake  Comments/MD notified:Continue to monitor, treatment effective    Sharion SettlerJohnson, Tamora Huneke J  Remember to initiate Hypoglycemia Order Set & complete

## 2014-02-21 NOTE — Progress Notes (Addendum)
Inpatient Diabetes Program Recommendations  AACE/ADA: New Consensus Statement on Inpatient Glycemic Control (2013)  Target Ranges:  Prepandial:   less than 140 mg/dL      Peak postprandial:   less than 180 mg/dL (1-2 hours)      Critically ill patients:  140 - 180 mg/dL     Results for Randall [name], Randall [name] (MRN [id number]) as of 02/21/2014 09:45  Ref. Range 02/20/2014 06:27 02/20/2014 09:32 02/20/2014 11:38 02/20/2014 16:18 02/20/2014 21:26  Glucose-Capillary Latest Range: 70-99 mg/dL 578160 (H) 469256 (H) 629265 (H) 197 (H) 90    Results for Randall [name], Randall [name] (MRN [id number]) as of 02/21/2014 09:45  Ref. Range 02/21/2014 06:19 02/21/2014 07:00  Glucose-Capillary Latest Range: 70-99 mg/dL 54 (L) 91     **Levemir 78 units bid ordered and given yesterday as scheduled  **Hypoglycemic this morning with CBG 54 mg/dl.   **MD- Please consider decreasing Levemir dose to 70 units bid   Will follow. Ambrose FinlandJeannine Johnston Lamberto Dinapoli RN, MSN, CDE Diabetes Coordinator Inpatient Diabetes Program Team Pager: [phone] (8a-10p)

## 2014-02-22 ENCOUNTER — Non-Acute Institutional Stay (SKILLED_NURSING_FACILITY): Payer: Medicare Other | Admitting: Internal Medicine

## 2014-02-22 ENCOUNTER — Encounter (HOSPITAL_COMMUNITY): Payer: Self-pay | Admitting: Orthopaedic Surgery

## 2014-02-22 ENCOUNTER — Encounter: Payer: Self-pay | Admitting: Internal Medicine

## 2014-02-22 DIAGNOSIS — Z96659 Presence of unspecified artificial knee joint: Secondary | ICD-10-CM | POA: Insufficient documentation

## 2014-02-22 DIAGNOSIS — R32 Unspecified urinary incontinence: Secondary | ICD-10-CM | POA: Insufficient documentation

## 2014-02-22 DIAGNOSIS — E785 Hyperlipidemia, unspecified: Secondary | ICD-10-CM | POA: Insufficient documentation

## 2014-02-22 DIAGNOSIS — E119 Type 2 diabetes mellitus without complications: Secondary | ICD-10-CM

## 2014-02-22 DIAGNOSIS — G20 Parkinson's disease: Secondary | ICD-10-CM

## 2014-02-22 LAB — GLUCOSE, CAPILLARY
GLUCOSE-CAPILLARY: 126 mg/dL — AB (ref 70–99)
Glucose-Capillary: 192 mg/dL — ABNORMAL HIGH (ref 70–99)

## 2014-02-22 LAB — CBC
HEMATOCRIT: 24.2 % — AB (ref 39.0–52.0)
HEMOGLOBIN: 8.7 g/dL — AB (ref 13.0–17.0)
MCH: 34.5 pg — AB (ref 26.0–34.0)
MCHC: 36 g/dL (ref 30.0–36.0)
MCV: 96 fL (ref 78.0–100.0)
Platelets: 174 10*3/uL (ref 150–400)
RBC: 2.52 MIL/uL — AB (ref 4.22–5.81)
RDW: 14.8 % (ref 11.5–15.5)
WBC: 9 10*3/uL (ref 4.0–10.5)

## 2014-02-22 MED ORDER — INSULIN DETEMIR 100 UNIT/ML ~~LOC~~ SOLN: 75.0000 [IU] | Freq: Two times a day (BID) | SUBCUTANEOUS | Status: DC

## 2014-02-22 MED ORDER — INSULIN DETEMIR 100 UNIT/ML ~~LOC~~ SOLN
75.0000 [IU] | Freq: Two times a day (BID) | SUBCUTANEOUS | Status: DC
  Administered 2014-02-22: 75 [IU] via SUBCUTANEOUS
  Filled 2014-02-22 (×2): qty 0.75

## 2014-02-22 NOTE — Progress Notes (Signed)
MRN: [id number] Name: Randall [name]  Sex: male Age: 72 y.o. DOB: 04-15-1941  PSC #: [facility] Facility/Room: 1007 Level Of Care: SNF Provider: Merrilee Seashore D Emergency Contacts: Extended Emergency Contact Information Primary Emergency Contact: [place] Address: [address]          [place] [address] [place] Home Phone: [phone] Relation: Spouse   Allergies: Iodine and Shellfish allergy  Chief Complaint  Patient presents with  . nursing home admission    HPI: Patient is 73 y.o. male who underwent R knee arthroscopy and who is admitted for OT/PT.  Past Medical History  Diagnosis Date  . Parkinson disease   . Incontinence of urine     DR  RON   DAVIS    LOV FEB. 2015  . Neuromuscular disorder   . Arthritis   . Diabetes mellitus without complication   . Anxiety   . RSD (reflex sympathetic dystrophy)     2005  . Hyperlipidemia   . GERD (gastroesophageal reflux disease)     Past Surgical History  Procedure Laterality Date  . Appendectomy    . Cardiac catheterization      2005  . Colon surgery      resection diverticulitis  . Knee arthroscopy      right knee  . Total knee arthroplasty Right 02/19/2014    Procedure: TOTAL KNEE ARTHROPLASTY;  Surgeon: Velna Ochs, MD;  Location: [facility] OR;  Service: Orthopedics;  Laterality: Right;      Medication List       This list is accurate as of: 02/22/14  7:22 PM.  Always use your most recent med list.               aspirin 325 MG EC tablet  Take 1 tablet (325 mg total) by mouth 2 (two) times daily after a meal.     AZILECT 1 MG Tabs tablet  Generic drug:  rasagiline  Take 1 mg by mouth daily.     Carbidopa-Levodopa ER 25-100 MG tablet controlled release  Commonly known as:  SINEMET CR  Take 1.5 tablets by mouth 3 (three) times daily.     ferrous sulfate 325 (65 FE) MG tablet  Take 1 tablet (325 mg total) by mouth daily with breakfast.     furosemide 20 MG tablet  Commonly  known as:  LASIX  Take 20 mg by mouth daily.     HYDROcodone-acetaminophen 5-325 MG per tablet  Commonly known as:  NORCO/VICODIN  Take 1-2 tablets by mouth every 4 (four) hours as needed for moderate pain (breakthrough pain).     insulin detemir 100 UNIT/ML injection  Commonly known as:  LEVEMIR  Inject 0.75 mLs (75 Units total) into the skin 2 (two) times daily.     metFORMIN 500 MG 24 hr tablet  Commonly known as:  GLUCOPHAGE-XR  Take 500-1,000 mg by mouth 2 (two) times daily. Takes 1000mg  in the morning and 500mg  in the evening     methocarbamol 500 MG tablet  Commonly known as:  ROBAXIN  Take 1 tablet (500 mg total) by mouth every 6 (six) hours as needed for muscle spasms.     mirabegron ER 50 MG Tb24 tablet  Commonly known as:  MYRBETRIQ  Take 100 mg by mouth at bedtime.     nystatin 100000 UNIT/GM Powd  Apply topically daily as needed (for toe fungus).     rOPINIRole 8 MG 24 hr tablet  Commonly known as:  REQUIP  XL  Take 8 mg by mouth at bedtime.     simvastatin 40 MG tablet  Commonly known as:  ZOCOR  Take 40 mg by mouth daily.     venlafaxine XR 150 MG 24 hr capsule  Commonly known as:  EFFEXOR-XR  Take 150 mg by mouth daily with breakfast.        No orders of the defined types were placed in this encounter.     There is no immunization history on file for this patient.  History  Substance Use Topics  . Smoking status: Never Smoker   . Smokeless tobacco: Not on file  . Alcohol Use: 12.0 oz/week    20 Cans of beer per week    Family history is noncontributory    Review of Systems  DATA OBTAINED: from patient,  family member GENERAL: Feels well no fevers, fatigue, appetite changes SKIN: No itching, rash  EYES: No eye pain, redness, discharge EARS: No earache, tinnitus, change in hearing NOSE: No congestion, drainage or bleeding  MOUTH/THROAT: No mouth or tooth pain, No sore throat, No difficulty chewing or swallowing  RESPIRATORY: No cough,  wheezing, SOB CARDIAC: No chest pain, palpitations, lower extremity edema  GI: No abdominal pain, No N/V/D or constipation, No heartburn or reflux  GU: No dysuria, frequency or urgency, or incontinence  MUSCULOSKELETAL: No unrelieved bone/joint pain NEUROLOGIC: No headache, dizziness or focal weakness PSYCHIATRIC: No overt anxiety or sadness. Sleeps well. No behavior issue.   Filed Vitals:   02/22/14 1638  BP: 126/52  Pulse: 87  Temp: 98.5 F (36.9 C)  Resp: 17    Physical Exam  GENERAL APPEARANCE: Alert, conversant. Appropriately groomed. No acute distress.  SKIN: No diaphoresis rash, R knee dressed, no heat HEAD: Normocephalic, atraumatic  EYES: Conjunctiva/lids clear. Pupils round, reactive. EOMs intact.  EARS: External exam WNL, canals clear. Hearing grossly normal.  NOSE: No deformity or discharge.  MOUTH/THROAT: Lips w/o lesions  RESPIRATORY: Breathing is even, unlabored. Lung sounds are clear   CARDIOVASCULAR: Heart RRR no murmurs, rubs or gallops. Trace R peripheral edema.  GASTROINTESTINAL: Abdomen is soft, non-tender, not distended w/ normal bowel sounds GENITOURINARY: Bladder non tender, not distended  MUSCULOSKELETAL: No abnormal joints or musculature NEUROLOGIC: Oriented X3. Cranial nerves 2-12 grossly intact. Moves all extremities no tremor. PSYCHIATRIC: Mood and affect appropriate to situation, no behavioral issues  Patient Active Problem List   Diagnosis Date Noted  . 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 Hyperlipidemia   . Incontinence of urine   . Parkinson disease 02/21/2014  . 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 Diabetes 02/19/2014    CBC    Component Value Date/Time   WBC 9.0 02/22/2014 0440   RBC 2.52* 02/22/2014 0440   HGB 8.7* 02/22/2014 0440   HCT 24.2* 02/22/2014 0440   PLT 174 02/22/2014 0440   MCV 96.0 02/22/2014 0440   LYMPHSABS 1.7 02/12/2014 1520   MONOABS 0.6 02/12/2014 1520   EOSABS 0.5 02/12/2014 1520   BASOSABS 0.0 02/12/2014 1520     CMP     Component Value Date/Time   NA 140 02/20/2014 0605   K 4.2 02/20/2014 0605   CL 103 02/20/2014 0605   CO2 24 02/20/2014 0605   GLUCOSE 185* 02/20/2014 0605   BUN 12 02/20/2014 0605   CREATININE 0.58 02/20/2014 0605   CALCIUM 8.2* 02/20/2014 0605   GFRNONAA >90 02/20/2014 0605   GFRAA >90 02/20/2014 0605    Assessment and Plan  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

## 2014-02-22 NOTE — Discharge Summary (Signed)
Patient ID: Randall [name] MRN: [id number] DOB/AGE: [birth year] 74 y.o.  Admit date: 02/19/2014 Discharge date: 02/22/2014  Admission Diagnoses:  Principal Problem:   Right knee DJD Active Problems:   Diabetes   Parkinson disease   Discharge Diagnoses:  Same  Past Medical History  Diagnosis Date  . Hyperlipidemia   . Parkinson disease   . Incontinence of urine     DR  RON   DAVIS    LOV FEB. 2015  . Neuromuscular disorder   . Arthritis   . Diabetes mellitus without complication   . Anxiety   . RSD (reflex sympathetic dystrophy)     2005    Surgeries: Procedure(s): TOTAL KNEE ARTHROPLASTY on 02/19/2014   Consultants:    Discharged Condition: Improved  Hospital Course: Randall [name] is an 73 y.o. male who was admitted 02/19/2014 for operative treatment ofRight knee DJD. Patient has severe unremitting pain that affects sleep, daily activities, and work/hobbies. After pre-op clearance the patient was taken to the operating room on 02/19/2014 and underwent  Procedure(s): TOTAL KNEE ARTHROPLASTY.    Patient was given perioperative antibiotics: Anti-infectives   Start     Dose/Rate Route Frequency Ordered Stop   02/19/14 1600  ceFAZolin (ANCEF) IVPB 2 g/50 mL premix     2 g 100 mL/hr over 30 Minutes Intravenous Every 6 hours 02/19/14 1139 02/19/14 2326   02/19/14 0600  ceFAZolin (ANCEF) IVPB 2 g/50 mL premix     2 g 100 mL/hr over 30 Minutes Intravenous On call to O.R. 02/18/14 1435 02/19/14 4098       Patient was given sequential compression devices, early ambulation, and chemoprophylaxis to prevent DVT.  Patient benefited maximally from hospital stay and there were no complications.    Recent vital signs: Patient Vitals for the past 24 hrs:  BP Temp Temp src Pulse Resp SpO2  02/22/14 0543 126/52 mmHg 98.5 F (36.9 C) Oral 87 17 97 %  02/22/14 0400 - - - - 17 -  02/22/14 0000 - - - - 17 -  02/21/14 2149 134/62 mmHg 98.5 F (36.9 C) Oral 98 17 98 %  02/21/14  2000 - - - - 17 -  02/21/14 1411 112/45 mmHg 98.4 F (36.9 C) Oral 100 15 92 %     Recent laboratory studies:  Recent Labs  02/20/14 0605 02/21/14 0615 02/22/14 0440  WBC 10.2 11.6* 9.0  HGB 9.6* 9.6* 8.7*  HCT 27.4* 26.9* 24.2*  PLT 177 204 174  NA 140  --   --   K 4.2  --   --   CL 103  --   --   CO2 24  --   --   BUN 12  --   --   CREATININE 0.58  --   --   GLUCOSE 185*  --   --   CALCIUM 8.2*  --   --      Discharge Medications:     Medication List         aspirin 325 MG EC tablet  Take 1 tablet (325 mg total) by mouth 2 (two) times daily after a meal.     AZILECT 1 MG Tabs tablet  Generic drug:  rasagiline  Take 1 mg by mouth daily.     Carbidopa-Levodopa ER 25-100 MG tablet controlled release  Commonly known as:  SINEMET CR  Take 1.5 tablets by mouth 3 (three) times daily.     ferrous sulfate 325 (65 FE) MG tablet  Take 1 tablet (325 mg total) by mouth daily with breakfast.     furosemide 20 MG tablet  Commonly known as:  LASIX  Take 20 mg by mouth daily.     HYDROcodone-acetaminophen 5-325 MG per tablet  Commonly known as:  NORCO/VICODIN  Take 1-2 tablets by mouth every 4 (four) hours as needed for moderate pain (breakthrough pain).     insulin detemir 100 UNIT/ML injection  Commonly known as:  LEVEMIR  Inject 0.75 mLs (75 Units total) into the skin 2 (two) times daily.     metFORMIN 500 MG 24 hr tablet  Commonly known as:  GLUCOPHAGE-XR  Take 500-1,000 mg by mouth 2 (two) times daily. Takes 1000mg  in the morning and 500mg  in the evening     methocarbamol 500 MG tablet  Commonly known as:  ROBAXIN  Take 1 tablet (500 mg total) by mouth every 6 (six) hours as needed for muscle spasms.     mirabegron ER 50 MG Tb24 tablet  Commonly known as:  MYRBETRIQ  Take 100 mg by mouth at bedtime.     nystatin 100000 UNIT/GM Powd  Apply topically daily as needed (for toe fungus).     OVER THE COUNTER MEDICATION  Take 100 mg by mouth daily as needed  (for erectile dysfunction). *over the counter medication called Stendra*     rOPINIRole 8 MG 24 hr tablet  Commonly known as:  REQUIP XL  Take 8 mg by mouth at bedtime.     simvastatin 40 MG tablet  Commonly known as:  ZOCOR  Take 40 mg by mouth daily.     venlafaxine XR 150 MG 24 hr capsule  Commonly known as:  EFFEXOR-XR  Take 150 mg by mouth daily with breakfast.       CPM 0-60 advance as tolerated 6hrs per day. ASA 325mg  BID x 2 weeks. CBG monitoring  Diagnostic Studies: Dg Chest 2 View  02/12/2014   CLINICAL DATA Preop right knee replacement  EXAM CHEST  2 VIEW  COMPARISON T-SPINE dated 11/20/2009  FINDINGS Heart size and vascular pattern are normal. Lungs are clear. There is mild bilateral pleural apical thickening with minimal calcification in the left apex. This is stable.  IMPRESSION No active cardiopulmonary disease.  SIGNATURE  Electronically Signed   By: Esperanza Heir M.D.   On: 02/12/2014 15:52    Disposition:       Discharge Orders   Future Appointments Provider Department Dept Phone   05/02/2014 2:00 PM Lenn Sink, DPM [facility] [phone]   Future Orders Complete By Expires   Call MD / Call 911  As directed    Comments:     If you experience chest pain or shortness of breath, CALL 911 and be transported to the hospital emergency room.  If you develope a fever above 101 F, pus (white drainage) or increased drainage or redness at the wound, or calf pain, call your surgeon's office.   Call MD / Call 911  As directed    Comments:     If you experience chest pain or shortness of breath, CALL 911 and be transported to the hospital emergency room.  If you develope a fever above 101 F, pus (white drainage) or increased drainage or redness at the wound, or calf pain, call your surgeon's office.   Constipation Prevention  As directed    Comments:     Drink plenty of fluids.  Prune juice may be helpful.  You  may use a stool softener, such as  Colace (over the counter) 100 mg twice a day.  Use MiraLax (over the counter) for constipation as needed.   Constipation Prevention  As directed    Comments:     Drink plenty of fluids.  Prune juice may be helpful.  You may use a stool softener, such as Colace (over the counter) 100 mg twice a day.  Use MiraLax (over the counter) for constipation as needed.   Diet - low sodium heart healthy  As directed    Diet - low sodium heart healthy  As directed    Increase activity slowly as tolerated  As directed    Increase activity slowly as tolerated  As directed       Follow-up Information   Follow up with DALLDORF,PETER G, MD. Call in 2 weeks.   Specialty:  Orthopedic Surgery   Contact information:   [address] [place] [id number] [phone]        Signed: Drema HalonIDA, Etter Royall PAUL 02/22/2014, 8:18 AM

## 2014-02-22 NOTE — Assessment & Plan Note (Signed)
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

## 2014-02-22 NOTE — Assessment & Plan Note (Signed)
Continue mrbetriq

## 2014-02-22 NOTE — Progress Notes (Signed)
Clinical social worker assisted with patient discharge to skilled nursing facility, [facility].  CSW addressed all family questions and concerns. CSW copied chart and added all important documents. CSW also set up patient transportation with [facility]. Clinical Social Worker will sign off for now as social work intervention is no longer needed.   Cameka Rae, MSW, LCSWA [phone] 

## 2014-02-22 NOTE — Assessment & Plan Note (Signed)
Continue glucophage and insulin

## 2014-02-22 NOTE — Discharge Planning (Signed)
Report called to Liz at [facility]. 

## 2014-02-22 NOTE — Assessment & Plan Note (Signed)
Continue Zocor 

## 2014-02-22 NOTE — Progress Notes (Signed)
Subjective: 3 Days Post-Op Procedure(s) (LRB): TOTAL KNEE ARTHROPLASTY (Right)  Activity level:  wbat Diet tolerance:  Eating well Voiding:  ok Patient reports pain as 2 on 0-10 scale.    Objective: Vital signs in last 24 hours: Temp:  [98.4 F (36.9 C)-98.5 F (36.9 C)] 98.5 F (36.9 C) (03/20 0543) Pulse Rate:  [87-100] 87 (03/20 0543) Resp:  [15-17] 17 (03/20 0543) BP: (112-134)/(45-62) 126/52 mmHg (03/20 0543) SpO2:  [92 %-98 %] 97 % (03/20 0543)  Labs:  Recent Labs  02/20/14 0605 02/21/14 0615 02/22/14 0440  HGB 9.6* 9.6* 8.7*    Recent Labs  02/21/14 0615 02/22/14 0440  WBC 11.6* 9.0  RBC 2.79* 2.52*  HCT 26.9* 24.2*  PLT 204 174    Recent Labs  02/20/14 0605  NA 140  K 4.2  CL 103  CO2 24  BUN 12  CREATININE 0.58  GLUCOSE 185*  CALCIUM 8.2*   No results found for this basename: LABPT, INR,  in the last 72 hours  Physical Exam:  Neurologically intact ABD soft Sensation intact distally Dorsiflexion/Plantar flexion intact Incision: dressing C/D/I and scant drainage No cellulitis present  Assessment/Plan:  3 Days Post-Op Procedure(s) (LRB): TOTAL KNEE ARTHROPLASTY (Right) Advance diet Up with therapy Discharge to SNF today. Continue to monitor blood CBG. Changed levimir from 78 BID to 75 BID. Continue asa 325mg  BID until follow up in 2 weeks in office.      Randall [name], Ginger OrganNDREW PAUL 02/22/2014, 8:12 AM

## 2014-02-22 NOTE — Progress Notes (Signed)
Physical Therapy Treatment Patient Details Name: Randall [name] MRN: [id number] DOB: [birth year] Today's Date: 02/22/2014 Time: [id number] PT Time Calculation (min): 16 min  PT Assessment / Plan / Recommendation  History of Present Illness s/p R TKA with history of Parkinson's Disease   PT Comments   Pt progressing well towards PT goals. Ambulates up to 80 feet Min guard for safety and technique. Pt will benefit from continued skilled therapy in SNF upon d/c. Will follow up with pt this afternoon if he has not d/c in order to work on strength, endurance and gait training.  Follow Up Recommendations  SNF;Supervision/Assistance - 24 hour     Does the patient have the potential to tolerate intense rehabilitation     Barriers to Discharge        Equipment Recommendations  3in1 (PT)    Recommendations for Other Services OT consult  Frequency 7X/week   Progress towards PT Goals Progress towards PT goals: Progressing toward goals  Plan Current plan remains appropriate    Precautions / Restrictions Precautions Precautions: Knee Required Braces or Orthoses: Knee Immobilizer - Right Restrictions Weight Bearing Restrictions: Yes RLE Weight Bearing: Weight bearing as tolerated   Pertinent Vitals/Pain 2/10 pain Pt repositioned in chair for comfort    Mobility  Bed Mobility Overal bed mobility: Needs Assistance Bed Mobility: Sit to Supine Supine to sit: Min guard;HOB elevated General bed mobility comments: Min guard for safety. Pt requires extra time. Was able to bring RLE off of bed without assist. Verbal cues for technique Transfers Overall transfer level: Needs assistance Equipment used: Rolling walker (2 wheeled) Transfers: Sit to/from Stand Sit to Stand: Min assist General transfer comment: Min assist to control RW. Improved foot placement today however still needs cues for upright posture once standing in RW. Ambulation/Gait Ambulation/Gait assistance: Min guard Ambulation  Distance (Feet): 80 Feet Assistive device: Rolling walker (2 wheeled) Gait Pattern/deviations: Step-through pattern;Shuffle;Trunk flexed Gait velocity: Decreased General Gait Details: Pt with improved control of RW. Verbal cues for upright posture and forward gaze. Cues to to straighten right knee while in stance phase and to pick up feet due to shuffling this AM    Exercises Total Joint Exercises Ankle Circles/Pumps: AROM;Both;10 reps;Supine Quad Sets: AROM;Right;Seated;10 reps Goniometric ROM: 20-86 degrees right knee flexion (notable stiffness today)   PT Diagnosis:    PT Problem List:   PT Treatment Interventions:     PT Goals (current goals can now be found in the care plan section) Acute Rehab PT Goals PT Goal Formulation: With patient/family Time For Goal Achievement: 02/26/14 Potential to Achieve Goals: Fair (History of parkinson's disease)  Visit Information  Last PT Received On: 02/22/14 Assistance Needed: +1 History of Present Illness: s/p R TKA with history of Parkinson's Disease    Subjective Data  Subjective: Pt willing to ambulate with therapy this AM   Cognition  Cognition Arousal/Alertness: Awake/alert Behavior During Therapy: WFL for tasks assessed/performed Overall Cognitive Status: Within Functional Limits for tasks assessed    Balance     End of Session PT - End of Session Equipment Utilized During Treatment: Gait belt Activity Tolerance: Patient tolerated treatment well Patient left: with call bell/phone within reach;with family/visitor present;in chair Nurse Communication: Mobility status   GP    [facility] Sec[facility] Sh[place]bour, [place] [phone]  [place] Mount[place]bour, Lucas Winograd S 02/22/2014, 10:42 AM

## 2014-02-22 NOTE — Assessment & Plan Note (Signed)
Continue sinemet,azilect and requip

## 2014-02-25 ENCOUNTER — Encounter: Payer: Self-pay | Admitting: *Deleted

## 2014-03-05 ENCOUNTER — Encounter: Payer: Self-pay | Admitting: Internal Medicine

## 2014-03-05 ENCOUNTER — Non-Acute Institutional Stay (SKILLED_NURSING_FACILITY): Payer: Medicare Other | Admitting: Internal Medicine

## 2014-03-05 DIAGNOSIS — Z96659 Presence of unspecified artificial knee joint: Secondary | ICD-10-CM

## 2014-03-05 DIAGNOSIS — E785 Hyperlipidemia, unspecified: Secondary | ICD-10-CM

## 2014-03-05 DIAGNOSIS — G20 Parkinson's disease: Secondary | ICD-10-CM

## 2014-03-05 DIAGNOSIS — R32 Unspecified urinary incontinence: Secondary | ICD-10-CM

## 2014-03-05 DIAGNOSIS — E119 Type 2 diabetes mellitus without complications: Secondary | ICD-10-CM

## 2014-03-05 NOTE — Progress Notes (Signed)
MRN: [id number] Name: Randall [name]  Sex: male Age: 73 y.o. DOB: 1941/11/27  PSC #: [facility] Facility/Room: 1007 Level Of Care: SNF Provider: Merrilee SeashoreALEXANDER, Bernardina Cacho D Emergency Contacts: Extended Emergency Contact Information Primary Emergency Contact: Amerman,Ginger Address: [address]          [place] [id number] [place] Home Phone: [phone] Mobile Phone: [phone] Relation: Spouse Secondary Emergency Contact: Shelia Mediaichards,Grier Nish Address: [address]          [place], [place] [address] [place] Home Phone: [phone] Mobile Phone: [phone] Relation: Daughter  Code Status: FULL  Allergies: Iodine and Shellfish allergy  Chief Complaint  Patient presents with  . Discharge Note    HPI: Patient is 73 y.o. male who was admitted for R knee arthroplasty for PT/OT who is ready for discharge.  Past Medical History  Diagnosis Date  . Parkinson disease   . Incontinence of urine     DR  RON   DAVIS    LOV FEB. 2015  . Neuromuscular disorder   . Arthritis   . Diabetes mellitus without complication   . Anxiety   . RSD (reflex sympathetic dystrophy)     2005  . Hyperlipidemia   . GERD (gastroesophageal reflux disease)     Past Surgical History  Procedure Laterality Date  . Appendectomy    . Cardiac catheterization      2005  . Colon surgery      resection diverticulitis  . Knee arthroscopy      right knee  . Total knee arthroplasty Right 02/19/2014    Procedure: TOTAL KNEE ARTHROPLASTY;  Surgeon: Velna OchsPeter G Dalldorf, MD;  Location: [facility] OR;  Service: Orthopedics;  Laterality: Right;      Medication List       This list is accurate as of: 03/05/14 10:34 AM.  Always use your most recent med list.               aspirin 325 MG EC tablet  Take 325 mg by mouth 2 (two) times daily after a meal.     AZILECT 1 MG Tabs tablet  Generic drug:  rasagiline  Take 1 mg by mouth daily. For treatment of Parkinson's disease     Carbidopa-Levodopa ER 25-100 MG tablet controlled release  Commonly known as:  SINEMET CR  Take 1.5 tablets by mouth 3 (three) times daily.     ferrous sulfate 325 (65 FE) MG tablet  Take 1 tablet (325 mg total) by mouth daily with breakfast.     furosemide 20 MG tablet  Commonly known as:  LASIX  Take 20 mg by mouth daily. For edema/HTN     insulin detemir 100 UNIT/ML injection  Commonly known as:  LEVEMIR  Inject 0.75 mLs (75 Units total) into the skin 2 (two) times daily.     metFORMIN 500 MG 24 hr tablet  Commonly known as:  GLUCOPHAGE-XR  - Take 500-1,000 mg by mouth 2 (two) times daily. Takes 1000mg  in the morning and 500mg  in the evening.  - For DM     methocarbamol 500 MG tablet  Commonly known as:  ROBAXIN  Take 1 tablet (500 mg total) by mouth every 6 (six) hours as needed for muscle spasms.     mirabegron ER 50 MG Tb24 tablet  Commonly known as:  MYRBETRIQ  Take 100 mg by mouth at bedtime. For overactive bladder     nystatin 100000 UNIT/GM Powd  Apply topically daily as needed (  for toe fungus).     rOPINIRole 8 MG 24 hr tablet  Commonly known as:  REQUIP XL  Take 8 mg by mouth at bedtime. Treats Parkinson's disease     simvastatin 40 MG tablet  Commonly known as:  ZOCOR  Take 40 mg by mouth daily. Lowers cholesterol     traMADol 50 MG tablet  Commonly known as:  ULTRAM  Take by mouth every 6 (six) hours as needed.     triamcinolone cream 0.1 %  Commonly known as:  KENALOG     venlafaxine XR 150 MG 24 hr capsule  Commonly known as:  EFFEXOR-XR  Take 150 mg by mouth daily with breakfast. Treats depression, anxiety        Meds ordered this encounter  Medications  . traMADol (ULTRAM) 50 MG tablet    Sig: Take by mouth every 6 (six) hours as needed.  Marland Kitchen aspirin 325 MG EC tablet    Sig: Take 325 mg by mouth 2 (two) times daily after a meal.     There is no immunization history on file for this patient.  History  Substance Use Topics  . Smoking  status: Never Smoker   . Smokeless tobacco: Not on file  . Alcohol Use: 12.0 oz/week    20 Cans of beer per week     Comment: 12.0 oz/week    Filed Vitals:   03/05/14 1011  BP: 131/66  Pulse: 95  Temp: 98.8 F (37.1 C)  Resp: 20    Physical Exam  GENERAL APPEARANCE: Alert, conversant. Appropriately groomed. No acute distress.  HEENT: Unremarkable. RESPIRATORY: Breathing is even, unlabored. Lung sounds are clear   CARDIOVASCULAR: Heart RRR no murmurs, rubs or gallops. No peripheral edema.  GASTROINTESTINAL: Abdomen is soft, non-tender, not distended w/ normal bowel sounds.  NEUROLOGIC: Cranial nerves 2-12 grossly intact. Moves all extremities;tremor with intention  Patient Active Problem List   Diagnosis Date Noted  . 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 Hyperlipidemia   . Incontinence of urine   . Parkinson disease 02/21/2014  . 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 Diabetes 02/19/2014  . Idiopathic Parkinson's disease 01/14/2014  . 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    CBC    Component Value Date/Time   WBC 9.0 02/22/2014 0440   RBC 2.52* 02/22/2014 0440   HGB 8.7* 02/22/2014 0440   HCT 24.2* 02/22/2014 0440   PLT 174 02/22/2014 0440   MCV 96.0 02/22/2014 0440   LYMPHSABS 1.7 02/12/2014 1520   MONOABS 0.6 02/12/2014 1520   EOSABS 0.5 02/12/2014 1520   BASOSABS 0.0 02/12/2014 1520    CMP     Component Value Date/Time   NA 140 02/20/2014 0605   K 4.2 02/20/2014 0605   CL 103 02/20/2014 0605   CO2 24 02/20/2014 0605   GLUCOSE 185* 02/20/2014 0605   BUN 12 02/20/2014 0605   CREATININE 0.58 02/20/2014 0605   CALCIUM 8.2* 02/20/2014 0605   GFRNONAA >90 02/20/2014 0605   GFRAA >90 02/20/2014 0605    Assessment and Plan  Patient is discharged in stable and improved condition to home with HH,OT/PT.  Margit Hanks, MD

## 2014-03-08 ENCOUNTER — Ambulatory Visit (INDEPENDENT_AMBULATORY_CARE_PROVIDER_SITE_OTHER): Payer: Medicare Other | Admitting: Family Medicine

## 2014-03-08 VITALS — BP 100/60 | HR 89 | Temp 98.3°F | Resp 16 | Ht 71.0 in | Wt 191.0 lb

## 2014-03-08 DIAGNOSIS — L02419 Cutaneous abscess of limb, unspecified: Secondary | ICD-10-CM

## 2014-03-08 DIAGNOSIS — L03115 Cellulitis of right lower limb: Secondary | ICD-10-CM

## 2014-03-08 DIAGNOSIS — L03119 Cellulitis of unspecified part of limb: Secondary | ICD-10-CM

## 2014-03-08 MED ORDER — DOXYCYCLINE HYCLATE 100 MG PO TABS: 100.0000 mg | ORAL_TABLET | Freq: Two times a day (BID) | ORAL | Status: DC

## 2014-03-08 NOTE — Patient Instructions (Signed)
Cellulitis Cellulitis is an infection of the skin and the tissue beneath it. The infected area is usually red and tender. Cellulitis occurs most often in the arms and lower legs.  CAUSES  Cellulitis is caused by bacteria that enter the skin through cracks or cuts in the skin. The most common types of bacteria that cause cellulitis are Staphylococcus and Streptococcus. SYMPTOMS   Redness and warmth.  Swelling.  Tenderness or pain.  Fever. DIAGNOSIS  Your caregiver can usually determine what is wrong based on a physical exam. Blood tests may also be done. TREATMENT  Treatment usually involves taking an antibiotic medicine. HOME CARE INSTRUCTIONS   Take your antibiotics as directed. Finish them even if you start to feel better.  Keep the infected arm or leg elevated to reduce swelling.  Apply a warm cloth to the affected area up to 4 times per day to relieve pain.  Only take over-the-counter or prescription medicines for pain, discomfort, or fever as directed by your caregiver.  Keep all follow-up appointments as directed by your caregiver. SEEK MEDICAL CARE IF:   You notice red streaks coming from the infected area.  Your red area gets larger or turns dark in color.  Your bone or joint underneath the infected area becomes painful after the skin has healed.  Your infection returns in the same area or another area.  You notice a swollen bump in the infected area.  You develop new symptoms. SEEK IMMEDIATE MEDICAL CARE IF:   You have a fever.  You feel very sleepy.  You develop vomiting or diarrhea.  You have a general ill feeling (malaise) with muscle aches and pains. MAKE SURE YOU:   Understand these instructions.  Will watch your condition.  Will get help right away if you are not doing well or get worse. Document Released: 09/01/2005 Document Revised: 05/23/2012 Document Reviewed: 02/07/2012 [employer] Patient Information 2014 [employer].  

## 2014-03-08 NOTE — Progress Notes (Signed)
73 yo man with TKR by Dr. Jerl Santosalldorf on 02/19/2014 who has several days of right knee redness and warmth associated with increasing pain.  Objective:  Right knee shows erythema and induraton lower 1/4 of surgical wound site.  No fluctuance. Good ROM  Assessment:  Cellulitis of leg, right - Plan: doxycycline (VIBRA-TABS) 100 MG tablet  Signed, Elvina SidleKurt Jru Pense, MD  Return in 48 hours if not improving.

## 2014-03-09 ENCOUNTER — Inpatient Hospital Stay (HOSPITAL_COMMUNITY)
Admission: EM | Admit: 2014-03-09 | Discharge: 2014-03-11 | DRG: 863 | Disposition: A | Discharge status: Home / Self Care | Payer: Medicare Other | Attending: Internal Medicine | Admitting: Internal Medicine

## 2014-03-09 ENCOUNTER — Encounter (HOSPITAL_COMMUNITY): Payer: Self-pay | Admitting: Emergency Medicine

## 2014-03-09 DIAGNOSIS — Z96659 Presence of unspecified artificial knee joint: Secondary | ICD-10-CM

## 2014-03-09 DIAGNOSIS — L0390 Cellulitis, unspecified: Secondary | ICD-10-CM

## 2014-03-09 DIAGNOSIS — Y831 Surgical operation with implant of artificial internal device as the cause of abnormal reaction of the patient, or of later complication, without mention of misadventure at the time of the procedure: Secondary | ICD-10-CM | POA: Diagnosis present

## 2014-03-09 DIAGNOSIS — Z91013 Allergy to seafood: Secondary | ICD-10-CM

## 2014-03-09 DIAGNOSIS — E119 Type 2 diabetes mellitus without complications: Secondary | ICD-10-CM | POA: Diagnosis present

## 2014-03-09 DIAGNOSIS — E785 Hyperlipidemia, unspecified: Secondary | ICD-10-CM | POA: Diagnosis present

## 2014-03-09 DIAGNOSIS — L03119 Cellulitis of unspecified part of limb: Secondary | ICD-10-CM

## 2014-03-09 DIAGNOSIS — L02419 Cutaneous abscess of limb, unspecified: Secondary | ICD-10-CM | POA: Diagnosis present

## 2014-03-09 DIAGNOSIS — G20A1 Parkinson's disease without dyskinesia, without mention of fluctuations: Secondary | ICD-10-CM | POA: Diagnosis present

## 2014-03-09 DIAGNOSIS — G20 Parkinson's disease: Secondary | ICD-10-CM | POA: Diagnosis present

## 2014-03-09 DIAGNOSIS — Z794 Long term (current) use of insulin: Secondary | ICD-10-CM

## 2014-03-09 DIAGNOSIS — Z91041 Radiographic dye allergy status: Secondary | ICD-10-CM

## 2014-03-09 DIAGNOSIS — K219 Gastro-esophageal reflux disease without esophagitis: Secondary | ICD-10-CM | POA: Diagnosis present

## 2014-03-09 DIAGNOSIS — T8140XA Infection following a procedure, unspecified, initial encounter: Principal | ICD-10-CM | POA: Diagnosis present

## 2014-03-09 DIAGNOSIS — M1990 Unspecified osteoarthritis, unspecified site: Secondary | ICD-10-CM | POA: Diagnosis present

## 2014-03-09 DIAGNOSIS — T8149XA Infection following a procedure, other surgical site, initial encounter: Secondary | ICD-10-CM | POA: Diagnosis present

## 2014-03-09 DIAGNOSIS — F411 Generalized anxiety disorder: Secondary | ICD-10-CM | POA: Diagnosis present

## 2014-03-09 LAB — CBC WITH DIFFERENTIAL/PLATELET
BASOS ABS: 0 10*3/uL (ref 0.0–0.1)
Basophils Relative: 0 % (ref 0–1)
Eosinophils Absolute: 0.2 10*3/uL (ref 0.0–0.7)
Eosinophils Relative: 2 % (ref 0–5)
HCT: 29.4 % — ABNORMAL LOW (ref 39.0–52.0)
Hemoglobin: 10.1 g/dL — ABNORMAL LOW (ref 13.0–17.0)
Lymphocytes Relative: 16 % (ref 12–46)
Lymphs Abs: 1.5 10*3/uL (ref 0.7–4.0)
MCH: 33.3 pg (ref 26.0–34.0)
MCHC: 34.4 g/dL (ref 30.0–36.0)
MCV: 97 fL (ref 78.0–100.0)
MONO ABS: 0.6 10*3/uL (ref 0.1–1.0)
MONOS PCT: 6 % (ref 3–12)
Neutro Abs: 7.2 10*3/uL (ref 1.7–7.7)
Neutrophils Relative %: 76 % (ref 43–77)
Platelets: 291 10*3/uL (ref 150–400)
RBC: 3.03 MIL/uL — ABNORMAL LOW (ref 4.22–5.81)
RDW: 15.4 % (ref 11.5–15.5)
WBC: 9.4 10*3/uL (ref 4.0–10.5)

## 2014-03-09 LAB — COMPREHENSIVE METABOLIC PANEL
ALT: <5 U/L (ref 0–53)
AST: 11 U/L (ref 0–37)
Albumin: 3.5 g/dL (ref 3.5–5.2)
Alkaline Phosphatase: 135 U/L — ABNORMAL HIGH (ref 39–117)
BUN: 16 mg/dL (ref 6–23)
CO2: 23 meq/L (ref 19–32)
CREATININE: 0.64 mg/dL (ref 0.50–1.35)
Calcium: 8.4 mg/dL (ref 8.4–10.5)
Chloride: 104 mEq/L (ref 96–112)
GFR calc Af Amer: >90 mL/min (ref >90)
Glucose, Bld: 86 mg/dL (ref 70–99)
Potassium: 3.5 mEq/L — ABNORMAL LOW (ref 3.7–5.3)
Sodium: 142 mEq/L (ref 137–147)
Total Bilirubin: 0.3 mg/dL (ref 0.3–1.2)
Total Protein: 6.6 g/dL (ref 6.0–8.3)

## 2014-03-09 LAB — PROTIME-INR
INR: 0.99 (ref 0.00–1.49)
PROTHROMBIN TIME: 12.9 s (ref 11.6–15.2)

## 2014-03-09 LAB — APTT: APTT: 31 s (ref 24–37)

## 2014-03-09 MED ORDER — INSULIN ASPART 100 UNIT/ML ~~LOC~~ SOLN
0.0000 [IU] | Freq: Three times a day (TID) | SUBCUTANEOUS | Status: DC
  Administered 2014-03-10: 3 [IU] via SUBCUTANEOUS

## 2014-03-09 MED ORDER — CARBIDOPA-LEVODOPA 25-100 MG PO TABS
1.5000 | ORAL_TABLET | Freq: Three times a day (TID) | ORAL | Status: DC
  Administered 2014-03-10 – 2014-03-11 (×4): 1.5 via ORAL
  Filled 2014-03-09 (×7): qty 1.5

## 2014-03-09 MED ORDER — TRAMADOL HCL 50 MG PO TABS: 50.0000 mg | ORAL_TABLET | Freq: Four times a day (QID) | ORAL | Status: DC | PRN

## 2014-03-09 MED ORDER — ACETAMINOPHEN 325 MG PO TABS
650.0000 mg | ORAL_TABLET | Freq: Four times a day (QID) | ORAL | Status: DC | PRN
  Administered 2014-03-10: 650 mg via ORAL
  Filled 2014-03-09: qty 2

## 2014-03-09 MED ORDER — METFORMIN HCL ER 500 MG PO TB24
500.0000 mg | ORAL_TABLET | Freq: Every day | ORAL | Status: DC
  Administered 2014-03-10: 500 mg via ORAL
  Filled 2014-03-09 (×2): qty 1

## 2014-03-09 MED ORDER — FUROSEMIDE 20 MG PO TABS
20.0000 mg | ORAL_TABLET | Freq: Every day | ORAL | Status: DC
  Administered 2014-03-10 – 2014-03-11 (×2): 20 mg via ORAL
  Filled 2014-03-09 (×2): qty 1

## 2014-03-09 MED ORDER — ROPINIROLE HCL 1 MG PO TABS
2.0000 mg | ORAL_TABLET | Freq: Three times a day (TID) | ORAL | Status: DC
  Administered 2014-03-11 (×2): 2 mg via ORAL
  Filled 2014-03-09 (×6): qty 2

## 2014-03-09 MED ORDER — ROPINIROLE HCL ER 8 MG PO TB24: 8.0000 mg | ORAL_TABLET | Freq: Every day | ORAL | Status: DC

## 2014-03-09 MED ORDER — METFORMIN HCL ER 500 MG PO TB24: 500.0000 mg | ORAL_TABLET | Freq: Two times a day (BID) | ORAL | Status: DC

## 2014-03-09 MED ORDER — RASAGILINE MESYLATE 1 MG PO TABS
1.0000 mg | ORAL_TABLET | Freq: Every day | ORAL | Status: DC
  Administered 2014-03-10 – 2014-03-11 (×2): 1 mg via ORAL
  Filled 2014-03-09 (×2): qty 1

## 2014-03-09 MED ORDER — CEFAZOLIN SODIUM 1-5 GM-% IV SOLN
1.0000 g | Freq: Three times a day (TID) | INTRAVENOUS | Status: DC
  Administered 2014-03-10 – 2014-03-11 (×5): 1 g via INTRAVENOUS
  Filled 2014-03-09 (×10): qty 50

## 2014-03-09 MED ORDER — ENOXAPARIN SODIUM 40 MG/0.4ML ~~LOC~~ SOLN
40.0000 mg | Freq: Every day | SUBCUTANEOUS | Status: DC
Start: 2014-03-10 — End: 2014-03-11
  Administered 2014-03-10 – 2014-03-11 (×2): 40 mg via SUBCUTANEOUS
  Filled 2014-03-09 (×2): qty 0.4

## 2014-03-09 MED ORDER — MIRABEGRON ER 50 MG PO TB24
100.0000 mg | ORAL_TABLET | Freq: Every day | ORAL | Status: DC
  Administered 2014-03-10 (×2): 100 mg via ORAL
  Filled 2014-03-09 (×4): qty 2

## 2014-03-09 MED ORDER — INSULIN DETEMIR 100 UNIT/ML ~~LOC~~ SOLN
70.0000 [IU] | Freq: Two times a day (BID) | SUBCUTANEOUS | Status: DC
  Administered 2014-03-10 – 2014-03-11 (×3): 70 [IU] via SUBCUTANEOUS
  Filled 2014-03-09 (×5): qty 0.7

## 2014-03-09 MED ORDER — VENLAFAXINE HCL ER 150 MG PO CP24
150.0000 mg | ORAL_CAPSULE | Freq: Every day | ORAL | Status: DC
  Administered 2014-03-10 – 2014-03-11 (×2): 150 mg via ORAL
  Filled 2014-03-09 (×3): qty 1

## 2014-03-09 MED ORDER — SIMVASTATIN 40 MG PO TABS
40.0000 mg | ORAL_TABLET | Freq: Every day | ORAL | Status: DC
  Administered 2014-03-10: 40 mg via ORAL
  Filled 2014-03-09 (×2): qty 1

## 2014-03-09 MED ORDER — SODIUM CHLORIDE 0.9 % IJ SOLN: 3.0000 mL | INTRAMUSCULAR | Status: DC | PRN

## 2014-03-09 MED ORDER — SODIUM CHLORIDE 0.9 % IJ SOLN
3.0000 mL | Freq: Two times a day (BID) | INTRAMUSCULAR | Status: DC
  Administered 2014-03-10: 3 mL via INTRAVENOUS

## 2014-03-09 MED ORDER — ENOXAPARIN SODIUM 100 MG/ML ~~LOC~~ SOLN
1.0000 mg/kg | Freq: Once | SUBCUTANEOUS | Status: AC
  Administered 2014-03-09: 90 mg via SUBCUTANEOUS
  Filled 2014-03-09: qty 1

## 2014-03-09 MED ORDER — ACETAMINOPHEN 650 MG RE SUPP: 650.0000 mg | Freq: Four times a day (QID) | RECTAL | Status: DC | PRN

## 2014-03-09 MED ORDER — CARBIDOPA-LEVODOPA ER 25-100 MG PO TBCR: 1.5000 | EXTENDED_RELEASE_TABLET | Freq: Three times a day (TID) | ORAL | Status: DC

## 2014-03-09 MED ORDER — ZOLPIDEM TARTRATE 5 MG PO TABS: 5.0000 mg | ORAL_TABLET | Freq: Every evening | ORAL | Status: DC | PRN

## 2014-03-09 MED ORDER — SODIUM CHLORIDE 0.9 % IV SOLN: 250.0000 mL | INTRAVENOUS | Status: DC | PRN

## 2014-03-09 MED ORDER — CLINDAMYCIN PHOSPHATE 600 MG/50ML IV SOLN
600.0000 mg | Freq: Once | INTRAVENOUS | Status: AC
  Administered 2014-03-09: 600 mg via INTRAVENOUS
  Filled 2014-03-09: qty 50

## 2014-03-09 MED ORDER — METFORMIN HCL ER 500 MG PO TB24
1000.0000 mg | ORAL_TABLET | Freq: Every day | ORAL | Status: DC
  Administered 2014-03-10: 1000 mg via ORAL
  Filled 2014-03-09 (×3): qty 2

## 2014-03-09 NOTE — ED Provider Notes (Signed)
CSN: [id number]     Arrival date & time 03/09/14  1811 History   First MD Initiated Contact with Patient 03/09/14 1851     Chief Complaint  Patient presents with  . Knee Pain     (Consider location/radiation/quality/duration/timing/severity/associated sxs/prior Treatment) Patient is a 73 y.o. male presenting with knee pain.  Knee Pain  Pt had R total knee replacement about 3 weeks ago.Has been doing well post-op and is back home after brief rehab stay. He noticed some redness to his R leg yesterday and was seen at urgent care where he was given oral antibiotics (doxycycline). In the last 24hours he has had worsening pain and swelling in that leg that has progressed to include his entire leg from thigh to ankle. The redness is still relatively localized to anterior proximal lower leg without streaking. Denies any CP, SOB or heart racing. Denies fever at home.   Past Medical History  Diagnosis Date  . Parkinson disease   . Incontinence of urine     DR  RON   DAVIS    LOV FEB. 2015  . Neuromuscular disorder   . Arthritis   . Diabetes mellitus without complication   . Anxiety   . RSD (reflex sympathetic dystrophy)     2005  . Hyperlipidemia   . GERD (gastroesophageal reflux disease)    Past Surgical History  Procedure Laterality Date  . Appendectomy    . Cardiac catheterization      2005  . Colon surgery      resection diverticulitis  . Knee arthroscopy      right knee  . Total knee arthroplasty Right 02/19/2014    Procedure: TOTAL KNEE ARTHROPLASTY;  Surgeon: Velna OchsPeter G Dalldorf, MD;  Location: [facility] OR;  Service: Orthopedics;  Laterality: Right;   No family history on file. History  Substance Use Topics  . Smoking status: Never Smoker   . Smokeless tobacco: Not on file  . Alcohol Use: 12.0 oz/week    20 Cans of beer per week     Comment: 12.0 oz/week    Review of Systems All other systems reviewed and are negative except as noted in HPI.     Allergies  Iodine and  Shellfish allergy  Home Medications   Current Outpatient Rx  Name  Route  Sig  Dispense  Refill  . Carbidopa-Levodopa ER (SINEMET CR) 25-100 MG tablet controlled release   Oral   Take 1.5 tablets by mouth 3 (three) times daily.         Marland Kitchen. doxycycline (VIBRA-TABS) 100 MG tablet   Oral   Take 1 tablet (100 mg total) by mouth 2 (two) times daily.   20 tablet   0   . ferrous sulfate 325 (65 FE) MG tablet   Oral   Take 1 tablet (325 mg total) by mouth daily with breakfast.   30 tablet   3   . furosemide (LASIX) 20 MG tablet   Oral   Take 20 mg by mouth daily. For edema/HTN         . insulin detemir (LEVEMIR) 100 UNIT/ML injection   Subcutaneous   Inject 0.75 mLs (75 Units total) into the skin 2 (two) times daily.   10 mL   11   . metFORMIN (GLUCOPHAGE-XR) 500 MG 24 hr tablet   Oral   Take 500-1,000 mg by mouth 2 (two) times daily. Takes 1000mg  in the morning and 500mg  in the evening. For DM         .  methocarbamol (ROBAXIN) 500 MG tablet   Oral   Take 1 tablet (500 mg total) by mouth every 6 (six) hours as needed for muscle spasms.   30 tablet   0   . mirabegron ER (MYRBETRIQ) 50 MG TB24 tablet   Oral   Take 100 mg by mouth at bedtime. For overactive bladder         . nystatin (MYCOSTATIN/NYSTOP) 100000 UNIT/GM POWD   Topical   Apply topically daily as needed (for toe fungus).         . rasagiline (AZILECT) 1 MG TABS tablet   Oral   Take 1 mg by mouth daily. For treatment of Parkinson's disease         . rOPINIRole (REQUIP XL) 8 MG 24 hr tablet   Oral   Take 8 mg by mouth at bedtime. Treats Parkinson's disease         . simvastatin (ZOCOR) 40 MG tablet   Oral   Take 40 mg by mouth daily. Lowers cholesterol         . traMADol (ULTRAM) 50 MG tablet   Oral   Take by mouth every 6 (six) hours as needed.         . triamcinolone cream (KENALOG) 0.1 %               . venlafaxine XR (EFFEXOR-XR) 150 MG 24 hr capsule   Oral   Take 150 mg  by mouth daily with breakfast. Treats depression, anxiety          BP 130/61  Pulse 93  Temp(Src) 98 F (36.7 C) (Oral)  Resp 15  Ht 5\' 11"  (1.803 m)  Wt 195 lb 9 oz (88.707 kg)  BMI 27.29 kg/m2  SpO2 99% Physical Exam  Nursing note and vitals reviewed. Constitutional: He is oriented to person, place, and time. He appears well-developed and well-nourished.  HENT:  Head: Normocephalic and atraumatic.  Eyes: EOM are normal. Pupils are equal, round, and reactive to light.  Neck: Normal range of motion. Neck supple.  Cardiovascular: Normal rate, normal heart sounds and intact distal pulses.   Pulmonary/Chest: Effort normal and breath sounds normal.  Abdominal: Bowel sounds are normal. He exhibits no distension. There is no tenderness.  Musculoskeletal: He exhibits edema and tenderness.  Pitting edema of the RLE from the thigh to the foot with tenderness particularly over the medial thigh, popliteal fossa and medial knee, there is erythema over the lower medial knee, surgical incision healing well, no drainage or dehiscence  Neurological: He is alert and oriented to person, place, and time. He has normal strength. No cranial nerve deficit or sensory deficit.  Skin: Skin is warm and dry. No rash noted.  Psychiatric: He has a normal mood and affect.    ED Course  [place] bedside Date/Time: 03/09/2014 7:37 PM Performed by: Susy Frizzle B. Authorized by: Pollyann Savoy Consent: Verbal consent obtained. Consent given by: patient Comments: Limited bedside ultrasound of RLE done due to lack of formal vascular study availability. There is normal compression of deep veins in proximal RLE but unable to fully rule out a DVT at this time.    (including critical care time)   Labs Review Labs Reviewed  CBC WITH DIFFERENTIAL - Abnormal; Notable for the following:    RBC 3.03 (*)    Hemoglobin 10.1 (*)    HCT 29.4 (*)    All other components within normal limits  COMPREHENSIVE  METABOLIC PANEL - Abnormal; Notable  for the following:    Potassium 3.5 (*)    Alkaline Phosphatase 135 (*)    All other components within normal limits  PROTIME-INR  APTT   Imaging Review No results found.   EKG Interpretation None      MDM   Final diagnoses:  None    Concern for cellulitis vs DVT. Pt denies any PE symptoms and despite initially being borderline tachycardic is now normal rate, additionally he has allergy to iodinated contrast. Will begin IV Clinda and give a dose of Lovenox. Discussed with Dr. Jacky Kindle who is on call for the patient's PCP. He will come evaluate the patient in the ED. Disposition pending per his recommendations. Care will be signed out to the oncoming ED provider at the change of shift.     Stone Spirito B. Bernette Mayers, MD 03/09/14 1943

## 2014-03-09 NOTE — ED Notes (Signed)
The doctor at [facility] that saw him yesterday diagnosed him with cellulitis of the rt knee

## 2014-03-09 NOTE — ED Notes (Signed)
Dr. Jacky KindleAronson is at bedside.

## 2014-03-09 NOTE — H&P (Signed)
PCP:   Gaspar GarbeISOVEC,Klyde Banka W, MD   Chief Complaint:  Right knee infection  HPI: [name] is a gentleman who regular patient of Dr. symmetric with Parkinson's disease, diabetes mellitus type 2, gastroesophageal reflux disease, osteoarthritis presenting at this time with redness involving his right knee. He is 3 weeks status post total knee arthroplasty and went to skilled nursing for rehabilitation. He is been home taking outpatient rehabilitation but developed redness involving the inferior aspect of his right knee incision. He was instructed to go to urgent medical and was started on doxycycline 100 twice a day yesterday having had 3 doses. Despite this the redness increases with some swelling and process the emergency room for further management. He's had no fever or chills. She's had no calf pain. He's had no chest pain or shortness of breath. Blood sugars have been stable.   Past Medical History: Past Medical History  Diagnosis Date  . Parkinson disease   . Incontinence of urine     DR  RON   DAVIS    LOV FEB. 2015  . Neuromuscular disorder   . Arthritis   . Diabetes mellitus without complication   . Anxiety   . RSD (reflex sympathetic dystrophy)     2005  . Hyperlipidemia   . GERD (gastroesophageal reflux disease)    Past Surgical History  Procedure Laterality Date  . Appendectomy    . Cardiac catheterization      2005  . Colon surgery      resection diverticulitis  . Knee arthroscopy      right knee  . Total knee arthroplasty Right 02/19/2014    Procedure: TOTAL KNEE ARTHROPLASTY;  Surgeon: Velna OchsPeter G Dalldorf, MD;  Location: [facility] OR;  Service: Orthopedics;  Laterality: Right;    Medications: Prior to Admission medications   Medication Sig Start Date End Date Taking? Authorizing Provider  Carbidopa-Levodopa ER (SINEMET CR) 25-100 MG tablet controlled release Take 1.5 tablets by mouth 3 (three) times daily.   Yes Historical Provider, MD  doxycycline (VIBRA-TABS) 100 MG tablet  Take 1 tablet (100 mg total) by mouth 2 (two) times daily. 03/08/14  Yes Elvina SidleKurt Lauenstein, MD  furosemide (LASIX) 20 MG tablet Take 20 mg by mouth daily. For edema/HTN   Yes Historical Provider, MD  insulin detemir (LEVEMIR) 100 UNIT/ML injection Inject 70 Units into the skin 2 (two) times daily.   Yes Historical Provider, MD  metFORMIN (GLUCOPHAGE-XR) 500 MG 24 hr tablet Take 500-1,000 mg by mouth 2 (two) times daily. Takes 1000mg  in the morning and 500mg  in the evening. For DM   Yes Historical Provider, MD  mirabegron ER (MYRBETRIQ) 50 MG TB24 tablet Take 100 mg by mouth at bedtime. For overactive bladder   Yes Historical Provider, MD  nystatin (MYCOSTATIN/NYSTOP) 100000 UNIT/GM POWD Apply topically daily as needed (for toe fungus).   Yes Historical Provider, MD  rasagiline (AZILECT) 1 MG TABS tablet Take 1 mg by mouth daily. For treatment of Parkinson's disease   Yes Historical Provider, MD  rOPINIRole (REQUIP XL) 8 MG 24 hr tablet Take 8 mg by mouth at bedtime. Treats Parkinson's disease   Yes Historical Provider, MD  traMADol (ULTRAM) 50 MG tablet Take 50 mg by mouth every 6 (six) hours as needed for moderate pain.    Yes Historical Provider, MD  venlafaxine XR (EFFEXOR-XR) 150 MG 24 hr capsule Take 150 mg by mouth daily with breakfast. Treats depression, anxiety   Yes Historical Provider, MD  simvastatin (ZOCOR) 40 MG tablet  Take 40 mg by mouth daily. Lowers cholesterol    Historical Provider, MD    Allergies:   Allergies  Allergen Reactions  . Iodine Nausea And Vomiting  . Shellfish Allergy Nausea And Vomiting    Social History:  reports that he has never smoked. He does not have any smokeless tobacco history on file. He reports that he drinks about 12.0 ounces of alcohol per week. He reports that he does not use illicit drugs.  Family History: No family history on file.  Physical Exam: Filed Vitals:   03/09/14 1915 03/09/14 2000 03/09/14 2100 03/09/14 2145  BP: 130/61 130/59  135/77 138/64  Pulse: 93 90 112 99  Temp:      TempSrc:      Resp: 15 21 16 17   Height:      Weight:      SpO2: 99% 96% 96% 97%   General appearance: alert, cooperative and no distress Head: Normocephalic, without obvious abnormality, atraumatic Eyes: conjunctivae/corneas clear. PERRL, EOM's intact.  Nose: Nares normal. Septum midline. Mucosa normal. No drainage or sinus tenderness. Throat: lips, mucosa, and tongue normal; teeth and gums normal Neck: no adenopathy, no carotid bruit, no JVD and thyroid not enlarged, symmetric, no tenderness/mass/nodules Resp: clear to auscultation bilaterally Cardio: regular rate and rhythm, S1, S2 normal, no murmur, click, rub or gallop GI: soft, non-tender; bowel sounds normal; no masses,  no organomegaly Extremities: extremities normal, atraumatic, no cyanosis or edema Pulses: 2+ and symmetric, right knee incision is well approximated but significant erythema surrounds the inferior aspect of the lower incision extending into the anterior aspect overlying the tibia. He does have trace to 1+ edema. Homan negative. Lymph nodes: Cervical adenopathy: no cervical lymphadenopathy Neurologic: Alert and oriented X 3, normal strength and tone. Normal symmetric reflexes.     Labs on Admission:   Recent Labs  03/09/14 1835  NA 142  K 3.5*  CL 104  CO2 23  GLUCOSE 86  BUN 16  CREATININE 0.64  CALCIUM 8.4    Recent Labs  03/09/14 1835  AST 11  ALT <5  ALKPHOS 135*  BILITOT 0.3  PROT 6.6  ALBUMIN 3.5   No results found for this basename: LIPASE, AMYLASE,  in the last 72 hours  Recent Labs  03/09/14 1835  WBC 9.4  NEUTROABS 7.2  HGB 10.1*  HCT 29.4*  MCV 97.0  PLT 291   No results found for this basename: CKTOTAL, CKMB, CKMBINDEX, TROPONINI,  in the last 72 hours No results found for this basename: TSH, T4TOTAL, FREET3, T3FREE, THYROIDAB,  in the last 72 hours No results found for this basename: VITAMINB12, FOLATE, FERRITIN, TIBC,  IRON, RETICCTPCT,  in the last 72 hours  Radiological Exams on Admission: Dg Chest 2 View  02/12/2014   CLINICAL DATA Preop right knee replacement  EXAM CHEST  2 VIEW  COMPARISON T-SPINE dated 11/20/2009  FINDINGS Heart size and vascular pattern are normal. Lungs are clear. There is mild bilateral pleural apical thickening with minimal calcification in the left apex. This is stable.  IMPRESSION No active cardiopulmonary disease.  SIGNATURE  Electronically Signed   By: Esperanza Heir M.D.   On: 02/12/2014 15:52   Orders placed during the hospital encounter of 02/12/14  . EKG 12-LEAD  . EKG 12-LEAD    Assessment/Plan Active Problems:   Wound infection after surgery Cellulitis parkinsons disease Gastroesophageal reflux disease  Admit for IV antibiotics  Caylen Yardley A 03/09/2014, 9:58 PM

## 2014-03-09 NOTE — ED Notes (Signed)
The pt had a rt knee replacement march 17th .  The past 2 days he has had pain in the calf area rt and a nurse saw today and the pt went to the urgent care on [place].  He was given oral antibiotics but the pain redness and swelling has increased today.

## 2014-03-09 NOTE — ED Notes (Signed)
S/p Right TKR 02-19-14. Onset 2 days ago redness at  Incision site.  Was placed on ABX.  Onset yesterday swelling to right lower leg and knee.  MD at beside.

## 2014-03-10 DIAGNOSIS — M7989 Other specified soft tissue disorders: Secondary | ICD-10-CM

## 2014-03-10 LAB — HEMOGLOBIN A1C
Hgb A1c MFr Bld: 6.1 % — ABNORMAL HIGH (ref <5.7)
Mean Plasma Glucose: 128 mg/dL — ABNORMAL HIGH (ref <117)

## 2014-03-10 LAB — CBC
HCT: 25.7 % — ABNORMAL LOW (ref 39.0–52.0)
HEMOGLOBIN: 8.8 g/dL — AB (ref 13.0–17.0)
MCH: 33.3 pg (ref 26.0–34.0)
MCHC: 34.2 g/dL (ref 30.0–36.0)
MCV: 97.3 fL (ref 78.0–100.0)
Platelets: 258 10*3/uL (ref 150–400)
RBC: 2.64 MIL/uL — ABNORMAL LOW (ref 4.22–5.81)
RDW: 15.4 % (ref 11.5–15.5)
WBC: 8.7 10*3/uL (ref 4.0–10.5)

## 2014-03-10 LAB — GLUCOSE, CAPILLARY
GLUCOSE-CAPILLARY: 162 mg/dL — AB (ref 70–99)
GLUCOSE-CAPILLARY: 95 mg/dL (ref 70–99)
Glucose-Capillary: 106 mg/dL — ABNORMAL HIGH (ref 70–99)
Glucose-Capillary: 88 mg/dL (ref 70–99)
Glucose-Capillary: 238 mg/dL — ABNORMAL HIGH (ref 70–99)
Glucose-Capillary: 98 mg/dL (ref 70–99)

## 2014-03-10 LAB — CREATININE, SERUM
Creatinine, Ser: 0.62 mg/dL (ref 0.50–1.35)
GFR calc Af Amer: >90 mL/min (ref >90)

## 2014-03-10 NOTE — Progress Notes (Signed)
Subjective: Clearly better up showering feels much better following a night of IV antibiotics. Answered multiple questions about reluctance of orthopedics per edp to admit since it was a postoperative knee. Indicate a regardless we took care of it and he is doing better.  Objective: Vital signs in last 24 hours: Temp:  [97.7 F (36.5 C)-98 F (36.7 C)] 97.7 F (36.5 C) (04/05 1240) Pulse Rate:  [77-112] 92 (04/05 1240) Resp:  [15-21] 18 (04/05 1240) BP: (127-143)/(57-77) 127/57 mmHg (04/05 1240) SpO2:  [96 %-100 %] 100 % (04/05 1240) Weight:  [88.707 kg (195 lb 9 oz)] 88.707 kg (195 lb 9 oz) (04/04 1818) Weight change:   CBG (last 3)   Recent Labs  03/10/14 0008 03/10/14 0658 03/10/14 1231  GLUCAP 95 106* 162*    Intake/Output from previous day: 04/04 0701 - 04/05 0700 In: 240 [P.O.:240] Out: -   Physical Exam: Awake alert bright just getting out of the shower ambulating steadily Chest clear Regular rate and rhythm no murmur Abdomen benign Right knee incision intact still erythema surrounding the inferior aspect of the incision extending down into the anterior pretibial area but improved from yesterday less bright and not progressing and he Overall 1+ edema involving his right lower extremity trace edema involving the left Intact distal pulses Negative Homans sign Neurologic exam is normal   Lab Results:  Recent Labs  03/09/14 1835 03/10/14 0025  NA 142  --   K 3.5*  --   CL 104  --   CO2 23  --   GLUCOSE 86  --   BUN 16  --   CREATININE 0.64 0.62  CALCIUM 8.4  --     Recent Labs  03/09/14 1835  AST 11  ALT <5  ALKPHOS 135*  BILITOT 0.3  PROT 6.6  ALBUMIN 3.5    Recent Labs  03/09/14 1835 03/10/14 0025  WBC 9.4 8.7  NEUTROABS 7.2  --   HGB 10.1* 8.8*  HCT 29.4* 25.7*  MCV 97.0 97.3  PLT 291 258   Lab Results  Component Value Date   INR 0.99 03/09/2014   INR 1.03 02/12/2014   No results found for this basename: CKTOTAL, CKMB,  CKMBINDEX, TROPONINI,  in the last 72 hours No results found for this basename: TSH, T4TOTAL, FREET3, T3FREE, THYROIDAB,  in the last 72 hours No results found for this basename: VITAMINB12, FOLATE, FERRITIN, TIBC, IRON, RETICCTPCT,  in the last 72 hours  Studies/Results: No results found.   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 Will check venous Dopplers but doubt DVT   LOS: 1 day   Miya Luviano A 03/10/2014, 12:58 PM

## 2014-03-10 NOTE — Progress Notes (Signed)
VASCULAR LAB PRELIMINARY  PRELIMINARY  PRELIMINARY  PRELIMINARY  Right lower extremity venous duplex completed.    Preliminary report:  Right:  No evidence of DVT, superficial thrombosis, or Baker's cyst.  Shashana Fullington, RVS 03/10/2014, 5:34 PM

## 2014-03-11 LAB — GLUCOSE, CAPILLARY
Glucose-Capillary: 79 mg/dL (ref 70–99)
Glucose-Capillary: 162 mg/dL — ABNORMAL HIGH (ref 70–99)

## 2014-03-11 MED ORDER — CVS PROBIOTIC PO CAPS: 1.0000 | ORAL_CAPSULE | Freq: Every day | ORAL | Status: DC

## 2014-03-11 MED ORDER — CLINDAMYCIN HCL 300 MG PO CAPS: ORAL_CAPSULE | ORAL | Status: DC

## 2014-03-11 NOTE — Progress Notes (Signed)
Patient d/c to home, IV removed, instructions reviewed.

## 2014-03-11 NOTE — Discharge Summary (Signed)
Physician Discharge Summary    Caryl Aspdward N Karp  MR#: [id number]  DOB:Sep 06, 1941  Date of Admission: 03/09/2014 Date of Discharge: 03/11/2014  Attending Physician:Trevor Wilkie  Patient's JYN:WGNFAOZ,HYQMVHQPCP:TISOVEC,RICHARD W, MD   Discharge Diagnoses: Active Problems:   Wound infection after surgery   Discharge Medications:   Medication List         AZILECT 1 MG Tabs tablet  Generic drug:  rasagiline  Take 1 mg by mouth daily. For treatment of Parkinson's disease     Carbidopa-Levodopa ER 25-100 MG tablet controlled release  Commonly known as:  SINEMET CR  Take 1.5 tablets by mouth 3 (three) times daily.     clindamycin 300 MG capsule  Commonly known as:  CLEOCIN  Take 1 capsule by mouth 3 times daily with last dose on evening of 4/11     doxycycline 100 MG tablet  Commonly known as:  VIBRA-TABS  Take 1 tablet (100 mg total) by mouth 2 (two) times daily.     furosemide 20 MG tablet  Commonly known as:  LASIX  Take 20 mg by mouth daily. For edema/HTN     insulin detemir 100 UNIT/ML injection  Commonly known as:  LEVEMIR  Inject 70 Units into the skin 2 (two) times daily.     metFORMIN 500 MG 24 hr tablet  Commonly known as:  GLUCOPHAGE-XR  - Take 500-1,000 mg by mouth 2 (two) times daily. Takes 1000mg  in the morning and 500mg  in the evening.  - For DM     mirabegron ER 50 MG Tb24 tablet  Commonly known as:  MYRBETRIQ  Take 100 mg by mouth at bedtime. For overactive bladder     nystatin 100000 UNIT/GM Powd  Apply topically daily as needed (for toe fungus).     rOPINIRole 8 MG 24 hr tablet  Commonly known as:  REQUIP XL  Take 8 mg by mouth at bedtime. Treats Parkinson's disease     simvastatin 40 MG tablet  Commonly known as:  ZOCOR  Take 40 mg by mouth daily. Lowers cholesterol     traMADol 50 MG tablet  Commonly known as:  ULTRAM  Take 50 mg by mouth every 6 (six) hours as needed for moderate pain.     venlafaxine XR 150 MG 24 hr capsule  Commonly known as:   EFFEXOR-XR  Take 150 mg by mouth daily with breakfast. Treats depression, anxiety        Hospital Procedures: Dg Chest 2 View  02/12/2014   CLINICAL DATA Preop right knee replacement  EXAM CHEST  2 VIEW  COMPARISON T-SPINE dated 11/20/2009  FINDINGS Heart size and vascular pattern are normal. Lungs are clear. There is mild bilateral pleural apical thickening with minimal calcification in the left apex. This is stable.  IMPRESSION No active cardiopulmonary disease.  SIGNATURE  Electronically Signed   By: Esperanza Heiraymond  Rubner M.D.   On: 02/12/2014 15:52    History of Present Illness: Pt w/ Parkinson's disease, diabetes mellitus type 2, gastroesophageal reflux disease, osteoarthritis presenting at this time with redness involving his right knee. He is 3 weeks status post total knee arthroplasty and went to skilled nursing for rehabilitation. He is been home taking outpatient rehabilitation but developed redness involving the inferior aspect of his right knee incision. He was instructed to go to urgent medical and was started on doxycycline 100 twice a day yesterday having had 3 doses. Despite this the redness increases with some swelling and process the emergency room for further management. He's  had no fever or chills. She's had no calf pain. He's had no chest pain or shortness of breath. Blood sugars have been stable.   Hospital Course: R knee cellulitis - patient failed 3 doses of doxycycline as outpt prior to admission. He has now received 24 hours of ancef and will be discharged to complete a 7 day course of clindamycin. He remains afebrile and erythema is much improved. His energy level is much improved. Labs remained stable. He was instructed to take a daily probiotic w/ the clinda and call if develops diarrhea   DM - Relatively well controlled on home meds. Will d/c home on home regimen   Day of Discharge Exam BP 124/64  Pulse 74  Temp(Src) 97.6 F (36.4 C) (Oral)  Resp 18  Ht 5\' 11"  (1.803  m)  Wt 83.915 kg (185 lb)  BMI 25.81 kg/m2  SpO2 100%  Physical Exam: General appearance: WM in NAD  Eyes: no scleral icterus Throat: oropharynx moist without erythema Resp: CTAB , no rales  Cardio: RRR, no MRG  GI: soft, non-tender; bowel sounds normal; no masses,  no organomegaly Extremities: no clubbing, cyanosis or edema Skin: R knee has knee incision. It has mild erythema at the distal aspect. No drainage, warmth, tenderness or erythema otherwise.   Discharge Labs:  Recent Labs  03/09/14 1835 03/10/14 0025  NA 142  --   K 3.5*  --   CL 104  --   CO2 23  --   GLUCOSE 86  --   BUN 16  --   CREATININE 0.64 0.62  CALCIUM 8.4  --     Recent Labs  03/09/14 1835  AST 11  ALT <5  ALKPHOS 135*  BILITOT 0.3  PROT 6.6  ALBUMIN 3.5    Recent Labs  03/09/14 1835 03/10/14 0025  WBC 9.4 8.7  NEUTROABS 7.2  --   HGB 10.1* 8.8*  HCT 29.4* 25.7*  MCV 97.0 97.3  PLT 291 258   Lab Results  Component Value Date   INR 0.99 03/09/2014   INR 1.03 02/12/2014   No results found for this basename: CKTOTAL, CKMB, CKMBINDEX, TROPONINI,  in the last 72 hours No results found for this basename: TSH, T4TOTAL, FREET3, T3FREE, THYROIDAB,  in the last 72 hours No results found for this basename: VITAMINB12, FOLATE, FERRITIN, TIBC, IRON, RETICCTPCT,  in the last 72 hours  Discharge instructions:     Discharge Orders   Future Appointments Provider Department Dept Phone   05/02/2014 2:00 PM Lenn Sink, DPM [facility] [phone]   Future Orders Complete By Expires   Call MD for:  redness, tenderness, or signs of infection (pain, swelling, redness, odor or green/yellow discharge around incision site)  As directed    Diet - low sodium heart healthy  As directed    Increase activity slowly  As directed      03-Skilled Nursing Facility   Disposition: home   Follow-up Appts: Follow-up with Dr. Patsey Berthold at [facility] in 7-14 days.  Call  for appointment.  Condition on Discharge: stable   Tests Needing Follow-up: none   Time spent in discharge (includes decision making & examination of pt): 45 minutes    Signed: Allanah Mcfarland 03/11/2014, 7:25 AM

## 2014-05-02 ENCOUNTER — Encounter: Payer: Self-pay | Admitting: Podiatry

## 2014-05-02 ENCOUNTER — Ambulatory Visit (INDEPENDENT_AMBULATORY_CARE_PROVIDER_SITE_OTHER): Payer: Medicare Other | Admitting: Podiatry

## 2014-05-02 VITALS — BP 136/73 | HR 95 | Resp 16

## 2014-05-02 DIAGNOSIS — B351 Tinea unguium: Secondary | ICD-10-CM

## 2014-05-02 DIAGNOSIS — M79609 Pain in unspecified limb: Secondary | ICD-10-CM

## 2014-05-02 DIAGNOSIS — L84 Corns and callosities: Secondary | ICD-10-CM

## 2014-05-02 NOTE — Progress Notes (Signed)
Subjective:     Patient ID: Randall [name], male   DOB: [birth year], 73 y.o.   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

## 2014-05-02 NOTE — Patient Instructions (Signed)
Diabetes and Foot Care Diabetes may cause you to have problems because of poor blood supply (circulation) to your feet and legs. This may cause the skin on your feet to become thinner, break easier, and heal more slowly. Your skin may become dry, and the skin may peel and crack. You may also have nerve damage in your legs and feet causing decreased feeling in them. You may not notice minor injuries to your feet that could lead to infections or more serious problems. Taking care of your feet is one of the most important things you can do for yourself.  HOME CARE INSTRUCTIONS  Wear shoes at all times, even in the house. Do not go barefoot. Bare feet are easily injured.  Check your feet daily for blisters, cuts, and redness. If you cannot see the bottom of your feet, use a mirror or ask someone for help.  Wash your feet with warm water (do not use hot water) and mild soap. Then pat your feet and the areas between your toes until they are completely dry. Do not soak your feet as this can dry your skin.  Apply a moisturizing lotion or petroleum jelly (that does not contain alcohol and is unscented) to the skin on your feet and to dry, brittle toenails. Do not apply lotion between your toes.  Trim your toenails straight across. Do not dig under them or around the cuticle. File the edges of your nails with an emery board or nail file.  Do not cut corns or calluses or try to remove them with medicine.  Wear clean socks or stockings every day. Make sure they are not too tight. Do not wear knee-high stockings since they may decrease blood flow to your legs.  Wear shoes that fit properly and have enough cushioning. To break in new shoes, wear them for just a few hours a day. This prevents you from injuring your feet. Always look in your shoes before you put them on to be sure there are no objects inside.  Do not cross your legs. This may decrease the blood flow to your feet.  If you find a minor scrape,  cut, or break in the skin on your feet, keep it and the skin around it clean and dry. These areas may be cleansed with mild soap and water. Do not cleanse the area with peroxide, alcohol, or iodine.  When you remove an adhesive bandage, be sure not to damage the skin around it.  If you have a wound, look at it several times a day to make sure it is healing.  Do not use heating pads or hot water bottles. They may burn your skin. If you have lost feeling in your feet or legs, you may not know it is happening until it is too late.  Make sure your health care provider performs a complete foot exam at least annually or more often if you have foot problems. Report any cuts, sores, or bruises to your health care provider immediately. SEEK MEDICAL CARE IF:   You have an injury that is not healing.  You have cuts or breaks in the skin.  You have an ingrown nail.  You notice redness on your legs or feet.  You feel burning or tingling in your legs or feet.  You have pain or cramps in your legs and feet.  Your legs or feet are numb.  Your feet always feel cold. SEEK IMMEDIATE MEDICAL CARE IF:   There is increasing redness,   swelling, or pain in or around a wound.  There is a red line that goes up your leg.  Pus is coming from a wound.  You develop a fever or as directed by your health care provider.  You notice a bad smell coming from an ulcer or wound. Document Released: 11/19/2000 Document Revised: 07/25/2013 Document Reviewed: 05/01/2013 Exit[employer] Patient Information 2014 [employer].  

## 2014-05-05 ENCOUNTER — Encounter: Payer: Self-pay | Admitting: Internal Medicine

## 2014-06-10 DIAGNOSIS — L84 Corns and callosities: Secondary | ICD-10-CM

## 2014-07-23 DIAGNOSIS — L84 Corns and callosities: Secondary | ICD-10-CM

## 2014-08-01 ENCOUNTER — Ambulatory Visit: Payer: Medicare Other | Admitting: Podiatry

## 2014-08-01 DIAGNOSIS — E1159 Type 2 diabetes mellitus with other circulatory complications: Secondary | ICD-10-CM

## 2014-08-01 DIAGNOSIS — B351 Tinea unguium: Secondary | ICD-10-CM

## 2014-08-01 DIAGNOSIS — Q828 Other specified congenital malformations of skin: Secondary | ICD-10-CM

## 2014-08-01 DIAGNOSIS — M79673 Pain in unspecified foot: Secondary | ICD-10-CM

## 2014-08-01 DIAGNOSIS — M79609 Pain in unspecified limb: Secondary | ICD-10-CM

## 2014-08-01 NOTE — Progress Notes (Signed)
Subjective:     Patient ID: Randall [name], male   DOB: 1941-10-16, 73 y.o.   MRN: [id number]  HPI long-term diabetic with thick yellow brittle nailbeds 1-5 both feet and lesions on the bottom of both feet that are painful when pressed and makes it difficult for him to walk. Long-term diabetic   Review of Systems     Objective:   Physical Exam Neurovascular status intact from previous visits diminished but intact with thick yellow brittle nailbeds 1-5 both feet and lesions on the bottom of both feet that are painful when pressed and he cannot take care of himself    Assessment:     Painful mycotic nail infection is 1-5 both feet and lesions on both feet that are painful when pressed    Plan:     Debris painful nailbeds 1-5 both feet and lesions on both feet with no iatrogenic bleeding noted

## 2014-11-07 ENCOUNTER — Other Ambulatory Visit: Payer: Medicare Other

## 2015-02-05 ENCOUNTER — Other Ambulatory Visit (HOSPITAL_COMMUNITY): Payer: Self-pay | Admitting: Orthopaedic Surgery

## 2015-02-05 DIAGNOSIS — M25561 Pain in right knee: Secondary | ICD-10-CM

## 2015-03-12 ENCOUNTER — Encounter (HOSPITAL_COMMUNITY)
Admission: RE | Admit: 2015-03-12 | Discharge: 2015-03-12 | Disposition: A | Discharge status: Home / Self Care | Payer: Medicare Other | Source: Ambulatory Visit | Attending: Orthopaedic Surgery | Admitting: Orthopaedic Surgery

## 2015-03-12 DIAGNOSIS — M25561 Pain in right knee: Secondary | ICD-10-CM | POA: Diagnosis present

## 2015-03-12 MED ORDER — TECHNETIUM TC 99M MEDRONATE IV KIT
25.2000 | PACK | Freq: Once | INTRAVENOUS | Status: AC | PRN
  Administered 2015-03-12: 25.2 via INTRAVENOUS

## 2015-04-01 ENCOUNTER — Ambulatory Visit (INDEPENDENT_AMBULATORY_CARE_PROVIDER_SITE_OTHER): Payer: Medicare Other | Admitting: Physician Assistant

## 2015-04-01 ENCOUNTER — Emergency Department (HOSPITAL_COMMUNITY): Payer: Medicare Other

## 2015-04-01 ENCOUNTER — Emergency Department (HOSPITAL_COMMUNITY)
Admission: EM | Admit: 2015-04-01 | Discharge: 2015-04-01 | Disposition: A | Discharge status: Home / Self Care | Payer: Medicare Other | Attending: Emergency Medicine | Admitting: Emergency Medicine

## 2015-04-01 ENCOUNTER — Encounter (HOSPITAL_COMMUNITY): Payer: Self-pay

## 2015-04-01 VITALS — BP 126/68 | HR 93 | Temp 98.2°F | Resp 16 | Ht 70.0 in | Wt 193.0 lb

## 2015-04-01 DIAGNOSIS — F419 Anxiety disorder, unspecified: Secondary | ICD-10-CM | POA: Diagnosis not present

## 2015-04-01 DIAGNOSIS — W01198A Fall on same level from slipping, tripping and stumbling with subsequent striking against other object, initial encounter: Secondary | ICD-10-CM | POA: Diagnosis not present

## 2015-04-01 DIAGNOSIS — G20 Parkinson's disease: Secondary | ICD-10-CM | POA: Diagnosis not present

## 2015-04-01 DIAGNOSIS — Z8739 Personal history of other diseases of the musculoskeletal system and connective tissue: Secondary | ICD-10-CM | POA: Diagnosis not present

## 2015-04-01 DIAGNOSIS — Z794 Long term (current) use of insulin: Secondary | ICD-10-CM | POA: Insufficient documentation

## 2015-04-01 DIAGNOSIS — E785 Hyperlipidemia, unspecified: Secondary | ICD-10-CM | POA: Diagnosis not present

## 2015-04-01 DIAGNOSIS — S79911A Unspecified injury of right hip, initial encounter: Secondary | ICD-10-CM | POA: Insufficient documentation

## 2015-04-01 DIAGNOSIS — M542 Cervicalgia: Secondary | ICD-10-CM

## 2015-04-01 DIAGNOSIS — S0101XA Laceration without foreign body of scalp, initial encounter: Secondary | ICD-10-CM | POA: Insufficient documentation

## 2015-04-01 DIAGNOSIS — Y9389 Activity, other specified: Secondary | ICD-10-CM | POA: Insufficient documentation

## 2015-04-01 DIAGNOSIS — M25559 Pain in unspecified hip: Secondary | ICD-10-CM

## 2015-04-01 DIAGNOSIS — Z79899 Other long term (current) drug therapy: Secondary | ICD-10-CM | POA: Diagnosis not present

## 2015-04-01 DIAGNOSIS — Y9289 Other specified places as the place of occurrence of the external cause: Secondary | ICD-10-CM | POA: Insufficient documentation

## 2015-04-01 DIAGNOSIS — Z8719 Personal history of other diseases of the digestive system: Secondary | ICD-10-CM | POA: Insufficient documentation

## 2015-04-01 DIAGNOSIS — E119 Type 2 diabetes mellitus without complications: Secondary | ICD-10-CM | POA: Diagnosis not present

## 2015-04-01 DIAGNOSIS — Y998 Other external cause status: Secondary | ICD-10-CM | POA: Insufficient documentation

## 2015-04-01 DIAGNOSIS — W1809XA Striking against other object with subsequent fall, initial encounter: Secondary | ICD-10-CM | POA: Diagnosis not present

## 2015-04-01 DIAGNOSIS — S0191XA Laceration without foreign body of unspecified part of head, initial encounter: Secondary | ICD-10-CM

## 2015-04-01 DIAGNOSIS — S0990XA Unspecified injury of head, initial encounter: Secondary | ICD-10-CM | POA: Diagnosis present

## 2015-04-01 DIAGNOSIS — W19XXXA Unspecified fall, initial encounter: Secondary | ICD-10-CM

## 2015-04-01 LAB — CBG MONITORING, ED: GLUCOSE-CAPILLARY: 200 mg/dL — AB (ref 70–99)

## 2015-04-01 MED ORDER — TRAMADOL HCL 50 MG PO TABS
50.0000 mg | ORAL_TABLET | Freq: Once | ORAL | Status: AC
  Administered 2015-04-01: 50 mg via ORAL
  Filled 2015-04-01: qty 1

## 2015-04-01 MED ORDER — LIDOCAINE-EPINEPHRINE 2 %-1:100000 IJ SOLN
20.0000 mL | Freq: Once | INTRAMUSCULAR | Status: AC
  Administered 2015-04-01: 20 mL
  Filled 2015-04-01: qty 1

## 2015-04-01 NOTE — Discharge Instructions (Signed)
Fall Prevention and Home Safety °Falls cause injuries and can affect all age groups. It is possible to use preventive measures to significantly decrease the likelihood of falls. There are many simple measures which can make your home safer and prevent falls. °OUTDOORS °· Repair cracks and edges of walkways and driveways. °· Remove high doorway thresholds. °· Trim shrubbery on the main path into your home. °· Have good outside lighting. °· Clear walkways of tools, rocks, debris, and clutter. °· Check that handrails are not broken and are securely fastened. Both sides of steps should have handrails. °· Have leaves, snow, and ice cleared regularly. °· Use sand or salt on walkways during winter months. °· In the garage, clean up grease or oil spills. °BATHROOM °· Install night lights. °· Install grab bars by the toilet and in the tub and shower. °· Use non-skid mats or decals in the tub or shower. °· Place a plastic non-slip stool in the shower to sit on, if needed. °· Keep floors dry and clean up all water on the floor immediately. °· Remove soap buildup in the tub or shower on a regular basis. °· Secure bath mats with non-slip, double-sided rug tape. °· Remove throw rugs and tripping hazards from the floors. °BEDROOMS °· Install night lights. °· Make sure a bedside light is easy to reach. °· Do not use oversized bedding. °· Keep a telephone by your bedside. °· Have a firm chair with side arms to use for getting dressed. °· Remove throw rugs and tripping hazards from the floor. °KITCHEN °· Keep handles on pots and pans turned toward the center of the stove. Use back burners when possible. °· Clean up spills quickly and allow time for drying. °· Avoid walking on wet floors. °· Avoid hot utensils and knives. °· Position shelves so they are not too high or low. °· Place commonly used objects within easy reach. °· If necessary, use a sturdy step stool with a grab bar when reaching. °· Keep electrical cables out of the  way. °· Do not use floor polish or wax that makes floors slippery. If you must use wax, use non-skid floor wax. °· Remove throw rugs and tripping hazards from the floor. °STAIRWAYS °· Never leave objects on stairs. °· Place handrails on both sides of stairways and use them. Fix any loose handrails. Make sure handrails on both sides of the stairways are as long as the stairs. °· Check carpeting to make sure it is firmly attached along stairs. Make repairs to worn or loose carpet promptly. °· Avoid placing throw rugs at the top or bottom of stairways, or properly secure the rug with carpet tape to prevent slippage. Get rid of throw rugs, if possible. °· Have an electrician put in a light switch at the top and bottom of the stairs. °OTHER FALL PREVENTION TIPS °· Wear low-heel or rubber-soled shoes that are supportive and fit well. Wear closed toe shoes. °· When using a stepladder, make sure it is fully opened and both spreaders are firmly locked. Do not climb a closed stepladder. °· Add color or contrast paint or tape to grab bars and handrails in your home. Place contrasting color strips on first and last steps. °· Learn and use mobility aids as needed. Install an electrical emergency response system. °· Turn on lights to avoid dark areas. Replace light bulbs that burn out immediately. Get light switches that glow. °· Arrange furniture to create clear pathways. Keep furniture in the same place. °·   Firmly attach carpet with non-skid or double-sided tape. °· Eliminate uneven floor surfaces. °· Select a carpet pattern that does not visually hide the edge of steps. °· Be aware of all pets. °OTHER HOME SAFETY TIPS °· Set the water temperature for 120° F (48.8° C). °· Keep emergency numbers on or near the telephone. °· Keep smoke detectors on every level of the home and near sleeping areas. °Document Released: 11/12/2002 Document Revised: 05/23/2012 Document Reviewed: 02/11/2012 °Exit[employer]® Patient Information ©2015  [employer]. This information is not intended to replace advice given to you by your health care provider. Make sure you discuss any questions you have with your health care provider. ° ° ° °Laceration Care, Adult °A laceration is a cut or lesion that goes through all layers of the skin and into the tissue just beneath the skin. °TREATMENT  °Some lacerations may not require closure. Some lacerations may not be able to be closed due to an increased risk of infection. It is important to see your caregiver as soon as possible after an injury to minimize the risk of infection and maximize the opportunity for successful closure. °If closure is appropriate, pain medicines may be given, if needed. The wound will be cleaned to help prevent infection. Your caregiver will use stitches (sutures), staples, wound glue (adhesive), or skin adhesive strips to repair the laceration. These tools bring the skin edges together to allow for faster healing and a better cosmetic outcome. However, all wounds will heal with a scar. Once the wound has healed, scarring can be minimized by covering the wound with sunscreen during the day for 1 full year. °HOME CARE INSTRUCTIONS  °For sutures or staples: °· Keep the wound clean and dry. °· If you were given a bandage (dressing), you should change it at least once a day. Also, change the dressing if it becomes wet or dirty, or as directed by your caregiver. °· Wash the wound with soap and water 2 times a day. Rinse the wound off with water to remove all soap. Pat the wound dry with a clean towel. °· After cleaning, apply a thin layer of the antibiotic ointment as recommended by your caregiver. This will help prevent infection and keep the dressing from sticking. °· You may shower as usual after the first 24 hours. Do not soak the wound in water until the sutures are removed. °· Only take over-the-counter or prescription medicines for pain, discomfort, or fever as directed by your  caregiver. °· Get your sutures or staples removed as directed by your caregiver. °For skin adhesive strips: °· Keep the wound clean and dry. °· Do not get the skin adhesive strips wet. You may bathe carefully, using caution to keep the wound dry. °· If the wound gets wet, pat it dry with a clean towel. °· Skin adhesive strips will fall off on their own. You may trim the strips as the wound heals. Do not remove skin adhesive strips that are still stuck to the wound. They will fall off in time. °For wound adhesive: °· You may briefly wet your wound in the shower or bath. Do not soak or scrub the wound. Do not swim. Avoid periods of heavy perspiration until the skin adhesive has fallen off on its own. After showering or bathing, gently pat the wound dry with a clean towel. °· Do not apply liquid medicine, cream medicine, or ointment medicine to your wound while the skin adhesive is in place. This may loosen the film before   your wound is healed. °· If a dressing is placed over the wound, be careful not to apply tape directly over the skin adhesive. This may cause the adhesive to be pulled off before the wound is healed. °· Avoid prolonged exposure to sunlight or tanning lamps while the skin adhesive is in place. Exposure to ultraviolet light in the first year will darken the scar. °· The skin adhesive will usually remain in place for 5 to 10 days, then naturally fall off the skin. Do not pick at the adhesive film. °You may need a tetanus shot if: °· You cannot remember when you had your last tetanus shot. °· You have never had a tetanus shot. °If you get a tetanus shot, your arm may swell, get red, and feel warm to the touch. This is common and not a problem. If you need a tetanus shot and you choose not to have one, there is a rare chance of getting tetanus. Sickness from tetanus can be serious. °SEEK MEDICAL CARE IF:  °· You have redness, swelling, or increasing pain in the wound. °· You see a red line that goes away  from the wound. °· You have yellowish-white fluid (pus) coming from the wound. °· You have a fever. °· You notice a bad smell coming from the wound or dressing. °· Your wound breaks open before or after sutures have been removed. °· You notice something coming out of the wound such as wood or glass. °· Your wound is on your hand or foot and you cannot move a finger or toe. °SEEK IMMEDIATE MEDICAL CARE IF:  °· Your pain is not controlled with prescribed medicine. °· You have severe swelling around the wound causing pain and numbness or a change in color in your arm, hand, leg, or foot. °· Your wound splits open and starts bleeding. °· You have worsening numbness, weakness, or loss of function of any joint around or beyond the wound. °· You develop painful lumps near the wound or on the skin anywhere on your body. °MAKE SURE YOU:  °· Understand these instructions. °· Will watch your condition. °· Will get help right away if you are not doing well or get worse. °Document Released: 11/22/2005 Document Revised: 02/14/2012 Document Reviewed: 05/18/2011 °ExitCare® Patient Information ©2015 [employer]. This information is not intended to replace advice given to you by your health care provider. Make sure you discuss any questions you have with your health care provider. ° °

## 2015-04-01 NOTE — ED Provider Notes (Signed)
CSN: [id number]     Arrival date & time 04/01/15  1646 History   First MD Initiated Contact with Patient 04/01/15 1737     Chief Complaint  Patient presents with  . Fall  . Head Injury  . Leg Pain     (Consider location/radiation/quality/duration/timing/severity/associated sxs/prior Treatment) HPI    PCP: Gaspar Garbe, MD Blood pressure 131/70, pulse 78, temperature 98.3 F (36.8 C), temperature source Oral, resp. rate 16, SpO2 96 %.  [name] is a 74 y.o.male with a significant PMH of [place], urinary incontinence, diabetes, GERD presents to the ER sent here by the urgent care with complaints of fall, laceration to his posterior scalp and right hip pain. The patient reports stepping on a step stool and attempts to install security system when he missed the last step and fell backwards onto the counter, hitting his head on plywood board. He denies having loss of consciousness. He has been ambulating since the incidence. Urgent care felt that he needed a head CT and sent him in a c-collar to the emergency department. He has chronic right hip and right shoulder pain. He is due for brain stimulation therapy at [facility] in 3 weeks for treatment of his Parkinson's. Wife is present and says that he is acting at baseline.  Negative Review of Symptoms: nausea, vomiting, diarrhea, weakness, confusion, ataxia, aphagia, hematuria, back pain, neck pain, change in vision, CP, SOB.   Past Medical History  Diagnosis Date  . Parkinson disease   . Incontinence of urine     DR  RON   DAVIS    LOV FEB. 2015  . Neuromuscular disorder   . Arthritis   . Diabetes mellitus without complication   . Anxiety   . RSD (reflex sympathetic dystrophy)     2005  . Hyperlipidemia   . GERD (gastroesophageal reflux disease)    Past Surgical History  Procedure Laterality Date  . Appendectomy    . Cardiac catheterization      2005  . Colon surgery      resection diverticulitis  . Knee  arthroscopy      right knee  . Total knee arthroplasty Right 02/19/2014    Procedure: TOTAL KNEE ARTHROPLASTY;  Surgeon: Velna Ochs, MD;  Location: [facility] OR;  Service: Orthopedics;  Laterality: Right;   History reviewed. No pertinent family history. History  Substance Use Topics  . Smoking status: Never Smoker   . Smokeless tobacco: Not on file  . Alcohol Use: 12.0 oz/week    20 Cans of beer per week     Comment: 12.0 oz/week    Review of Systems  10 Systems reviewed and are negative for acute change except as noted in the HPI.    Allergies  Iodine and Shellfish allergy  Home Medications   Prior to Admission medications   Medication Sig Start Date End Date Taking? Authorizing Provider  amoxicillin (AMOXIL) 500 MG capsule Take 2,000 mg by mouth. One hour prior to dental procedure.   Yes Historical Provider, MD  Avanafil 200 MG TABS Take 200 mg by mouth daily as needed.   Yes Historical Provider, MD  Carbidopa-Levodopa ER (SINEMET CR) 25-100 MG tablet controlled release Take 1.5 tablets by mouth 3 (three) times daily.   Yes Historical Provider, MD  donepezil (ARICEPT) 5 MG tablet Take 5 mg by mouth at bedtime. 09/25/14  Yes Historical Provider, MD  furosemide (LASIX) 20 MG tablet Take 20 mg by mouth daily. For edema/HTN  Yes Historical Provider, MD  Insulin NPH Human, Isophane, (HUMULIN N KWIKPEN [place]) Inject 50 Units into the skin 3 (three) times daily.   Yes Historical Provider, MD  metFORMIN (GLUCOPHAGE-XR) 500 MG 24 hr tablet Take 500-1,000 mg by mouth 2 (two) times daily. Takes 1000mg  in the morning and 500mg  in the evening. For DM   Yes Historical Provider, MD  mirabegron ER (MYRBETRIQ) 50 MG TB24 tablet Take 50 mg by mouth at bedtime. For overactive bladder   Yes Historical Provider, MD  rasagiline (AZILECT) 1 MG TABS tablet Take 1 mg by mouth daily. For treatment of Parkinson's disease   Yes Historical Provider, MD  rOPINIRole (REQUIP XL) 8 MG 24 hr tablet Take 8 mg by  mouth every morning. Treats Parkinson's disease   Yes Historical Provider, MD  simvastatin (ZOCOR) 40 MG tablet Take 40 mg by mouth daily. Lowers cholesterol   Yes Historical Provider, MD  venlafaxine XR (EFFEXOR-XR) 150 MG 24 hr capsule Take 150 mg by mouth daily with breakfast. Treats depression, anxiety   Yes Historical Provider, MD  VESICARE 5 MG tablet Take 1 tablet by mouth daily. 03/18/15  Yes Historical Provider, MD  clindamycin (CLEOCIN) 300 MG capsule Take 1 capsule by mouth 3 times daily with last dose on evening of 4/11 Patient not taking: Reported on 04/01/2015 03/11/14   Randall PennaScott Holwerda, MD  doxycycline (VIBRA-TABS) 100 MG tablet Take 1 tablet (100 mg total) by mouth 2 (two) times daily. Patient not taking: Reported on 04/01/2015 03/08/14   Elvina SidleKurt Lauenstein, MD  Probiotic Product ([employer] PROBIOTIC) CAPS Take 1 capsule by mouth daily. Patient not taking: Reported on 04/01/2015 03/11/14   Randall PennaScott Holwerda, MD   BP 131/70 mmHg  Pulse 78  Temp(Src) 98.3 F (36.8 C) (Oral)  Resp 16  SpO2 96% Physical Exam  Constitutional: He is oriented to person, place, and time. He appears well-developed and well-nourished. No distress.  HENT:  Head: Normocephalic. Head is with contusion and with laceration. Head is without raccoon's eyes, without Battle's sign, without abrasion, without right periorbital erythema and without left periorbital erythema.    Right Ear: Tympanic membrane and ear canal normal.  Left Ear: Tympanic membrane and ear canal normal.  Nose: Nose normal.  Mouth/Throat: Uvula is midline and oropharynx is clear and moist.  Linear laceration to lower posterior scalp that is approx 2.5 cm in length  Eyes: Pupils are equal, round, and reactive to light.  Neck: Normal range of motion. Neck supple. No spinous process tenderness and no muscular tenderness present.  Cardiovascular: Normal rate and regular rhythm.   Pulmonary/Chest: Effort normal and breath sounds normal.  Abdominal: Soft. Bowel  sounds are normal. He exhibits no distension. There is no tenderness. There is no rigidity and no guarding.  Musculoskeletal:       Right hip: He exhibits tenderness and bony tenderness. He exhibits normal range of motion, normal strength, no swelling, no crepitus, no deformity and no laceration.  Pt has good ROM and strength without assistance to bilateral hips. Some tenderness to the right hip.  Neurological: He is alert and oriented to person, place, and time.  Skin: Skin is warm and dry.  Nursing note and vitals reviewed.   ED Course  Procedures (including critical care time) Labs Review Labs Reviewed  CBG MONITORING, ED - Abnormal; Notable for the following:    Glucose-Capillary 200 (*)    All other components within normal limits    Imaging Review Ct Head Wo Contrast  04/01/2015  CLINICAL DATA:  Posterior scalp laceration, fall, head injury  EXAM: CT HEAD WITHOUT CONTRAST  TECHNIQUE: Contiguous axial images were obtained from the base of the skull through the vertex without contrast.  COMPARISON:  04/16/2009  FINDINGS: Diffuse brain atrophy noted with chronic white matter microvascular ischemic changes about the ventricles. No acute intracranial hemorrhage, mass lesion, definite infarction, midline shift, herniation, or extra-axial fluid collection. No focal mass effect or edema. Cisterns are patent. Cerebellar atrophy as well. Orbits are symmetric.  Chronic hyperostosis of the paranasal sinuses. Scattered sinus opacification. Complete opacification and mild expansion of the left frontal sinus. Appearance is suspicious for chronic sinusitis. 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 Mastoids remain clear.  IMPRESSION: Chronic atrophy and white matter microvascular ischemic changes.  No acute intracranial finding.  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   Electronically Signed   By: Judie Petit.  Shick M.D.   On: 04/01/2015 19:25   Dg Hip Unilat With Pelvis 2-3 Views Right  04/01/2015   CLINICAL DATA:  Patient fell with pain  EXAM: RIGHT HIP (WITH PELVIS) 2-3 VIEWS  COMPARISON:  None.  FINDINGS: Frontal pelvis as well as frontal and lateral right hip images obtained. There is no acute fracture or dislocation. There is slight symmetric narrowing of both hip joints. No erosive change.  IMPRESSION: Slight symmetric narrowing both hip joints. No acute fracture or dislocation.   Electronically Signed   By: Bretta Bang III M.D.   On: 04/01/2015 19:09     EKG Interpretation None      MDM   Final diagnoses:  Hip pain  Laceration of head, initial encounter  Fall, initial encounter    LACERATION REPAIR Performed by: Dorthula Matas Authorized by: Dorthula Matas Consent: Verbal consent obtained. Risks and benefits: risks, benefits and alternatives were discussed Consent given by: patient Patient identity confirmed: provided demographic data Prepped and Draped in normal sterile fashion Wound explored  Laceration Location: lower posterior scalp laceration  Laceration Length:  2. 5cm  No Foreign Bodies seen or palpated  Anesthesia: local infiltration  Local anesthetic: lidocaine 2 % with epinephrine  Anesthetic total: 3 ml  Irrigation method: syringe Amount of cleaning: standard  Skin closure: staples  Number of sutures: 3 staples  Technique: staples  Patient tolerance: Patient tolerated the procedure well with no immediate complications.  Pt UTD on tetanus. Dr. Effie Shy has seen patient as well and we feel patient is safe for discharge. He has received Ultram here in teh ED for pain and reports having some at home. Negative fx of hip xray and non acute changes on CT scan of the head.   74 y.o.Randall [name]'s  evaluation in the Emergency Department is complete. It has been determined that no acute conditions requiring further emergency intervention are present at this time. The patient/guardian have been advised of the diagnosis and plan. We have discussed signs and symptoms that warrant return to the ED, such as changes or worsening in symptoms.  Vital signs are stable at discharge. Filed Vitals:   04/01/15 1857  BP: 131/70  Pulse: 78  Temp:   Resp: 16    Patient/guardian has voiced understanding and agreed to follow-up with the PCP or specialist.   Marlon Pel, PA-C 04/01/15 2029  Mancel Bale, MD 04/01/15 [phone]

## 2015-04-01 NOTE — ED Notes (Signed)
Pt, being sent by PCP, c/o neck pain and head injury after a fall.

## 2015-04-01 NOTE — Progress Notes (Signed)
   04/01/2015 at 4:19 PM  Randall [name] / DOB: 1941-07-10 / MRN: [id number]  The patient has Right knee DJD; Diabetes; Parkinson disease; S/P total knee arthroplasty; Hyperlipidemia; Incontinence of urine; Idiopathic Parkinson's disease; Eunuchoidism; Benign fibroma of prostate; and Wound infection after surgery on his problem list.  SUBJECTIVE  Chief complaint: Laceration  Patient here for the evaluation of neck pain and laceration sustained at 10:30a today.  Reports he fell from a step stool and hit his head and neck on the edge of a piece of plywood that was sitting nearby.  Denies weakness, changes in sensation, headache, nausea, and LOC.    He  has a past medical history of Parkinson disease; Incontinence of urine; Neuromuscular disorder; Arthritis; Diabetes mellitus without complication; Anxiety; RSD (reflex sympathetic dystrophy); Hyperlipidemia; and GERD (gastroesophageal reflux disease).    Medications reviewed and updated by myself where necessary, and exist elsewhere in the encounter.   [name] is allergic to iodine and shellfish allergy. He  reports that he has never smoked. He does not have any smokeless tobacco history on file. He reports that he drinks about 12.0 oz of alcohol per week. He reports that he does not use illicit drugs. He  has no sexual activity history on file. The patient  has past surgical history that includes Appendectomy; Cardiac catheterization; Colon surgery; Knee arthroscopy; and Total knee arthroplasty (Right, 02/19/2014).  His family history is not on file.  Review of Systems  Constitutional: Negative.   Eyes: Negative.   Respiratory: Negative.   Cardiovascular: Negative.   Musculoskeletal: Positive for myalgias, falls and neck pain.  Skin: Negative.   Neurological: Negative for dizziness, tingling and headaches.    OBJECTIVE  His  height is 5\' 10"  (1.778 m) and weight is 193 lb (87.544 kg). His oral temperature is 98.2 F (36.8 C). His blood  pressure is 126/68 and his pulse is 93. His respiration is 16 and oxygen saturation is 98%.  The patient's body mass index is 27.69 kg/(m^2).  Physical Exam  Constitutional: He is oriented to person, place, and time.  Cardiovascular: Normal rate.   Respiratory: Effort normal.  Musculoskeletal:       Back:  Neurological: He is alert and oriented to person, place, and time. He has normal strength. He displays tremor (pill rolling left arm existing). No cranial nerve deficit or sensory deficit. Coordination and gait normal. GCS eye subscore is 4. GCS verbal subscore is 5. GCS motor subscore is 6.    No results found for this or any previous visit (from the past 24 hour(s)).  ASSESSMENT & PLAN  Randall [name] was seen today for laceration.  Diagnoses and all orders for this visit:  Fall against object, initial encounter  Neck pain: Cervical collar placed. Patient may or may not need a CT scan to rule out intracranial process.  Patient sent with wife via automobile to [facility] ED for further eval and management.    The patient was advised to call or come back to clinic if he does not see an improvement in symptoms, or worsens with the above plan.   Deliah BostonMichael Kendy Haston, MHS, PA-C [facility] 04/01/2015 4:19 PM

## 2015-04-01 NOTE — ED Provider Notes (Signed)
  Face-to-face evaluation   History: Mechanical fall climbing a step stool, but did not support his weight. He fell backwards. He did not lose consciousness. He complains of pain in his right posterior pelvis, and right posterior cranium. No neck pain, new paresthesia, or new weakness.  Physical exam: Alert, elderly man in mild discomfort. Stapled wound, right occiput is not bleeding. Neck nontender to palpation. Mild tenderness right posterior pelvic area, without associated crepitation or deformity.  Medical screening examination/treatment/procedure(s) were conducted as a shared visit with non-physician practitioner(s) and myself.  I personally evaluated the patient during the encounter  Mancel BaleElliott Reagen Haberman, MD 04/01/15 [phone]

## 2015-04-01 NOTE — ED Notes (Signed)
Pt, sent by Urgent Care, c/o posterior head lac and bilateral lower extremity pain after a trip and fall.  Pt reports falling backward off a step and hitting head.  Denies LOC and neck pain.  C collar placed by Urgent Care.  Denies blood thinners.  Pain score 2/10, increasing w/ ambulation.  Hx of Parkinson's.

## 2015-04-05 NOTE — Progress Notes (Signed)
  Medical screening examination/treatment/procedure(s) were performed by non-physician practitioner and as supervising physician I was immediately available for consultation/collaboration.     

## 2016-01-08 DIAGNOSIS — R0602 Shortness of breath: Secondary | ICD-10-CM | POA: Diagnosis not present

## 2016-01-08 DIAGNOSIS — Z6828 Body mass index (BMI) 28.0-28.9, adult: Secondary | ICD-10-CM | POA: Diagnosis not present

## 2016-01-08 DIAGNOSIS — R319 Hematuria, unspecified: Secondary | ICD-10-CM | POA: Diagnosis not present

## 2016-01-08 DIAGNOSIS — Z1389 Encounter for screening for other disorder: Secondary | ICD-10-CM | POA: Diagnosis not present

## 2016-01-20 DIAGNOSIS — N3281 Overactive bladder: Secondary | ICD-10-CM | POA: Diagnosis not present

## 2016-01-20 DIAGNOSIS — E11319 Type 2 diabetes mellitus with unspecified diabetic retinopathy without macular edema: Secondary | ICD-10-CM | POA: Diagnosis not present

## 2016-01-20 DIAGNOSIS — Z794 Long term (current) use of insulin: Secondary | ICD-10-CM | POA: Diagnosis not present

## 2016-01-20 DIAGNOSIS — R319 Hematuria, unspecified: Secondary | ICD-10-CM | POA: Diagnosis not present

## 2016-01-20 DIAGNOSIS — I341 Nonrheumatic mitral (valve) prolapse: Secondary | ICD-10-CM | POA: Diagnosis not present

## 2016-01-20 DIAGNOSIS — E7800 Pure hypercholesterolemia, unspecified: Secondary | ICD-10-CM | POA: Diagnosis not present

## 2016-01-20 DIAGNOSIS — M179 Osteoarthritis of knee, unspecified: Secondary | ICD-10-CM | POA: Diagnosis not present

## 2016-01-20 DIAGNOSIS — G20 Parkinson's disease: Secondary | ICD-10-CM | POA: Diagnosis not present

## 2016-01-20 DIAGNOSIS — F5221 Male erectile disorder: Secondary | ICD-10-CM | POA: Diagnosis not present

## 2016-01-20 DIAGNOSIS — Z6828 Body mass index (BMI) 28.0-28.9, adult: Secondary | ICD-10-CM | POA: Diagnosis not present

## 2016-01-23 DIAGNOSIS — N3941 Urge incontinence: Secondary | ICD-10-CM | POA: Diagnosis not present

## 2016-01-23 DIAGNOSIS — N529 Male erectile dysfunction, unspecified: Secondary | ICD-10-CM | POA: Diagnosis not present

## 2016-01-23 DIAGNOSIS — N401 Enlarged prostate with lower urinary tract symptoms: Secondary | ICD-10-CM | POA: Diagnosis not present

## 2016-01-23 DIAGNOSIS — G20 Parkinson's disease: Secondary | ICD-10-CM | POA: Diagnosis not present

## 2016-01-23 DIAGNOSIS — N319 Neuromuscular dysfunction of bladder, unspecified: Secondary | ICD-10-CM | POA: Diagnosis not present

## 2016-02-16 DIAGNOSIS — Z1389 Encounter for screening for other disorder: Secondary | ICD-10-CM | POA: Diagnosis not present

## 2016-02-16 DIAGNOSIS — R109 Unspecified abdominal pain: Secondary | ICD-10-CM | POA: Diagnosis not present

## 2016-02-16 DIAGNOSIS — Z6828 Body mass index (BMI) 28.0-28.9, adult: Secondary | ICD-10-CM | POA: Diagnosis not present

## 2016-03-02 DIAGNOSIS — R109 Unspecified abdominal pain: Secondary | ICD-10-CM | POA: Diagnosis not present

## 2016-03-02 DIAGNOSIS — Z6828 Body mass index (BMI) 28.0-28.9, adult: Secondary | ICD-10-CM | POA: Diagnosis not present

## 2016-03-02 DIAGNOSIS — K861 Other chronic pancreatitis: Secondary | ICD-10-CM | POA: Diagnosis not present

## 2016-03-02 DIAGNOSIS — R079 Chest pain, unspecified: Secondary | ICD-10-CM | POA: Diagnosis not present

## 2016-03-02 DIAGNOSIS — G20 Parkinson's disease: Secondary | ICD-10-CM | POA: Diagnosis not present

## 2016-03-02 DIAGNOSIS — K5900 Constipation, unspecified: Secondary | ICD-10-CM | POA: Diagnosis not present

## 2016-03-10 DIAGNOSIS — F5232 Male orgasmic disorder: Secondary | ICD-10-CM | POA: Diagnosis not present

## 2016-03-10 DIAGNOSIS — F3341 Major depressive disorder, recurrent, in partial remission: Secondary | ICD-10-CM | POA: Diagnosis not present

## 2016-03-19 DIAGNOSIS — E11319 Type 2 diabetes mellitus with unspecified diabetic retinopathy without macular edema: Secondary | ICD-10-CM | POA: Diagnosis not present

## 2016-03-31 DIAGNOSIS — R197 Diarrhea, unspecified: Secondary | ICD-10-CM | POA: Diagnosis not present

## 2016-03-31 DIAGNOSIS — Z6827 Body mass index (BMI) 27.0-27.9, adult: Secondary | ICD-10-CM | POA: Diagnosis not present

## 2016-03-31 DIAGNOSIS — R531 Weakness: Secondary | ICD-10-CM | POA: Diagnosis not present

## 2016-03-31 DIAGNOSIS — G20 Parkinson's disease: Secondary | ICD-10-CM | POA: Diagnosis not present

## 2016-03-31 DIAGNOSIS — Z4542 Encounter for adjustment and management of neuropacemaker (brain) (peripheral nerve) (spinal cord): Secondary | ICD-10-CM | POA: Diagnosis not present

## 2016-04-01 ENCOUNTER — Encounter: Payer: Self-pay | Admitting: Gastroenterology

## 2016-04-17 DIAGNOSIS — E11319 Type 2 diabetes mellitus with unspecified diabetic retinopathy without macular edema: Secondary | ICD-10-CM | POA: Diagnosis not present

## 2016-04-19 DIAGNOSIS — R197 Diarrhea, unspecified: Secondary | ICD-10-CM | POA: Diagnosis not present

## 2016-04-19 DIAGNOSIS — E7800 Pure hypercholesterolemia, unspecified: Secondary | ICD-10-CM | POA: Diagnosis not present

## 2016-04-19 DIAGNOSIS — Z794 Long term (current) use of insulin: Secondary | ICD-10-CM | POA: Diagnosis not present

## 2016-04-19 DIAGNOSIS — E11319 Type 2 diabetes mellitus with unspecified diabetic retinopathy without macular edema: Secondary | ICD-10-CM | POA: Diagnosis not present

## 2016-04-19 DIAGNOSIS — F5221 Male erectile disorder: Secondary | ICD-10-CM | POA: Diagnosis not present

## 2016-04-19 DIAGNOSIS — G20 Parkinson's disease: Secondary | ICD-10-CM | POA: Diagnosis not present

## 2016-04-19 DIAGNOSIS — Z6827 Body mass index (BMI) 27.0-27.9, adult: Secondary | ICD-10-CM | POA: Diagnosis not present

## 2016-04-29 ENCOUNTER — Ambulatory Visit (INDEPENDENT_AMBULATORY_CARE_PROVIDER_SITE_OTHER): Payer: Medicare Other | Admitting: Physician Assistant

## 2016-04-29 ENCOUNTER — Encounter: Payer: Self-pay | Admitting: Physician Assistant

## 2016-04-29 ENCOUNTER — Ambulatory Visit (INDEPENDENT_AMBULATORY_CARE_PROVIDER_SITE_OTHER)
Admission: RE | Admit: 2016-04-29 | Discharge: 2016-04-29 | Disposition: A | Discharge status: Home / Self Care | Payer: Medicare Other | Source: Ambulatory Visit | Attending: Physician Assistant | Admitting: Physician Assistant

## 2016-04-29 VITALS — BP 128/70 | HR 68 | Ht 70.0 in | Wt 192.0 lb

## 2016-04-29 DIAGNOSIS — R197 Diarrhea, unspecified: Secondary | ICD-10-CM

## 2016-04-29 DIAGNOSIS — K5909 Other constipation: Secondary | ICD-10-CM | POA: Diagnosis not present

## 2016-04-29 DIAGNOSIS — K5900 Constipation, unspecified: Secondary | ICD-10-CM | POA: Diagnosis not present

## 2016-04-29 NOTE — Patient Instructions (Signed)
Please go to the basement level to our radiology department.  We will call you with the results.  Take Align probiotic daily. We have given  You samples.

## 2016-04-29 NOTE — Progress Notes (Signed)
Patient ID: Randall [name], male   DOB: 09/28/1941, 75 y.o.   MRN: [id number]   Subjective:    Patient ID: Randall [name], male    DOB: 1941/08/31, 75 y.o.   MRN: [id number]  HPI  Thales is a pleasant 75 year old white male, referred by Dr. Wylene Simmer today. He is known very remotely to Dr. Lina Sar with no records in affect. He and his wife believe his last colonoscopy was probably 15 years ago. He is status post a remote colon resection for diverticulitis, has history of adult-onset diabetes mellitus and Parkinson's. He comes in today with complaints of diarrhea which has  been present over the past 2-3 months. He and his wife feel that this started after an episode of constipation. He was told to take MiraLAX for couple of days which she did and then says he has had diarrhea ever since. He had been given an empiric course of Cipro for 5 days by Dr. Darral Dash  without any benefit. Other than that he did not been on any recent antibiotics. She's not been on any new medications. He says the diarrhea is been somewhat intermittent though present on most days and states that at times he has been "tethered" to the bathroom. At worst she's been having up to 8 bowel movements per day of pasty loose nonbloody stool. His wife says he's had a few episodes of nocturnal incontinence stooling as well. Denies any abdominal pain or cramping, no fever and no nausea or vomiting in appetite has been okay. 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  Review of Systems Pertinent positive and negative review of systems were noted in the above HPI section.  All other review of systems was otherwise negative.  Outpatient Encounter Prescriptions as of 04/29/2016  Medication Sig  . amoxicillin (AMOXIL) 500 MG capsule Take 2,000 mg by mouth. One hour prior to dental procedure.  . Carbidopa-Levodopa ER (SINEMET CR) 25-100 MG tablet controlled release Take  1.5 tablets by mouth 3 (three) times daily.  . clindamycin (CLEOCIN) 300 MG capsule Take 1 capsule by mouth 3 times daily with last dose on evening of 4/11  . donepezil (ARICEPT) 5 MG tablet Take 5 mg by mouth at bedtime.  Marland Kitchen doxycycline (VIBRA-TABS) 100 MG tablet Take 1 tablet (100 mg total) by mouth 2 (two) times daily.  . furosemide (LASIX) 20 MG tablet Take 20 mg by mouth daily. For edema/HTN  . Insulin NPH Human, Isophane, (HUMULIN N KWIKPEN [place]) Inject 50 Units into the skin 3 (three) times daily.  . metFORMIN (GLUCOPHAGE-XR) 500 MG 24 hr tablet Take 500-1,000 mg by mouth 2 (two) times daily. Takes  in the morning and  in the evening. For DM  . mirabegron ER (MYRBETRIQ) 50 MG TB24 tablet Take 50 mg by mouth at bedtime. For overactive bladder  . Probiotic Product ([employer] PROBIOTIC) CAPS Take 1 capsule by mouth daily.  . rasagiline (AZILECT) 1 MG TABS tablet Take 1 mg by mouth daily. For treatment of Parkinson's disease  . rOPINIRole (REQUIP XL) 8 MG 24 hr tablet Take 8 mg by mouth every morning. Treats Parkinson's disease  . simvastatin (ZOCOR) 40 MG tablet Take 40 mg by mouth daily. Lowers cholesterol  . venlafaxine XR (EFFEXOR-XR) 150 MG 24 hr capsule Take 150 mg by mouth daily with breakfast. Treats depression, anxiety  . [DISCONTINUED] Avanafil 200 MG TABS Take 200 mg by mouth daily as needed.  Reported on 04/29/2016  . [DISCONTINUED] VESICARE 5 MG tablet Take 1 tablet by mouth daily. Reported on 04/29/2016   No facility-administered encounter medications on file as of 04/29/2016.   Allergies  Allergen Reactions  . Iodine Nausea And Vomiting    CT trace   . Shellfish Allergy Nausea And Vomiting   Patient Active Problem List   Diagnosis Date Noted  . Wound infection after surgery 03/09/2014  . 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 Hyperlipidemia   . Incontinence of urine   . Parkinson disease (HCC) 02/21/2014  . 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 Diabetes (HCC) 02/19/2014  .  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 Marital Status: Married    Spouse Name: N/A  . Number of Children: N/A  . Years of Education: N/A   Occupational History  . Not on file.   Social History Main Topics  . Smoking status: Never Smoker   . Smokeless tobacco: Not on file  . Alcohol Use: 12.0 oz/week    20 Cans of beer per week     Comment: 12.0 oz/week  . Drug Use: No  . Sexual Activity: Not on file   Other Topics Concern  . Not on file   Social History Narrative    Mr. Ivar BuryBooker's family history is not on file.      Objective:    Filed Vitals:   04/29/16 0910  BP: 128/70  Pulse: 68    Physical Exam well-developed elderly white male in no acute distress, accompanied by his wife, both pleasant blood pressure 128/70 pulse 68 height 5 foot 10 weight 192. HEENT; nontraumatic, cephalic EOMI PERRLA sclera anicteric, Cardiovascular; regular rate and rhythm with S1-S2 no murmur or gallop, Pulmonary ;clear bilaterally, Abdomen; soft nontender nondistended, somewhat doughy he has a lower midline incisional scar no palpable mass or hepatosplenomegaly, Rectal ;exam little stool in the rectal vault brown and heme negative, Extremities ;no clubbing cyanosis or edema, Neuropsych; patient's verbal responses are slow but appropriate and he ambulates very slowly with shuffling gait     Assessment & Plan:   #1  75 yo White male with Parkinson's disease status post remote colon resection for diverticular disease who presents with 2-3 month history of change in bowel habits with "diarrhea". KUB at onset of symptoms showed severe stool retention. I suspect she may have severe obstipation and has having overflow diarrhea, or frequent small volume stools in setting of severe constipation. He does not have fecal impaction on exam today. We will do infectious evaluation and possibly  treat empirically be does not show severe constipation. 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 If patient is severely obstipated will give him a bowel prep to purge bowel and then start on daily MiraLAX If no evidence of obstipation by KUB ,will do stool cultures and stool for C. Difficile Start Align 1 by mouth daily. Patient will keep his appointment to establish with Dr. Myrtie Neitheranis in June 2017. If we are unable to sort  out his symptoms prior to then he may need colonoscopy.  Kaliann Coryell Oswald HillockS Ameerah Huffstetler PA-C 04/29/2016   Cc: Tisovec, Adelfa Kohichard W, MD

## 2016-04-29 NOTE — Progress Notes (Signed)
Patient ID: Randall [name], male   DOB: 01/01/1941, 75 y.o.   MRN: [id number] Patient and his wife "Ginger" informed of abdominal x-ray results, see under 04/29/16 imaging report.  Ginger is coming now to get moviprep sample to use  and then will do the miralax and call us with an update in a week.  Per Mike GipAmy Esterwood PA-C he can just split up his moviprep doses by 2-3 hours and do clear liquids while doing the purge.  Ginger came and I explained the purge,questions answered.

## 2016-04-29 NOTE — Patient Instructions (Addendum)
Please use the moviprep as instructed on the box for the one day prep then after doing the purge take one dose of Miralax daily which is 17 grams, coupon provided.   Please stay on a clear liquid diet while doing the purge.   Call us in a week with an update.  Ask for Waynetta SandyBeth or Pam.     I appreciate the opportunity to care for you.

## 2016-04-29 NOTE — Progress Notes (Signed)
Thank you for sending this case to me. I have reviewed the entire note, and the outlined plan seems appropriate.  

## 2016-05-06 ENCOUNTER — Telehealth: Payer: Self-pay | Admitting: Gastroenterology

## 2016-05-07 ENCOUNTER — Other Ambulatory Visit: Payer: Self-pay | Admitting: *Deleted

## 2016-05-07 DIAGNOSIS — K5900 Constipation, unspecified: Secondary | ICD-10-CM

## 2016-05-07 NOTE — Telephone Encounter (Signed)
Patient calling back regarding this. Best # [phone]

## 2016-05-07 NOTE — Telephone Encounter (Signed)
Spoke with Mike GipAmy Esterwood PA, advised her of the patient's situation of loose, watery stools this am.  Amy told me to have him hold the Miralax this weekend. I told him to come to our x-ray department again on Monday morning between 8:00am and 9:00 am.  I told him to come to the 3rd floor and ask for me so he can give me an update about his weekend.

## 2016-05-07 NOTE — Telephone Encounter (Signed)
The patient wanted to report how the bowel purge went.  He said he did get rid of a lot of stool.  He had loose runny stools this morning.  He didn't know what to expect after the purge.  He didn't know if he was to be looking for water or what.  I asked him if he took the Miralax today.  He said he had not.  I told him not to take the Miralax for a few days.  He wasn't sure what to eat.  I advised a regular diet including fruits and vegetables for fiber once the runny stools are gone and his stools are formed.  He was disappointed I didn't call him back  As soon as he called.  I aplogized.  I told him I would call him Monday morning. I asked him what time I can call and he said 9:00 am.

## 2016-05-10 ENCOUNTER — Ambulatory Visit (INDEPENDENT_AMBULATORY_CARE_PROVIDER_SITE_OTHER)
Admission: RE | Admit: 2016-05-10 | Discharge: 2016-05-10 | Disposition: A | Discharge status: Home / Self Care | Payer: Medicare Other | Source: Ambulatory Visit | Attending: Physician Assistant | Admitting: Physician Assistant

## 2016-05-10 DIAGNOSIS — K5900 Constipation, unspecified: Secondary | ICD-10-CM

## 2016-05-10 NOTE — Telephone Encounter (Signed)
Advised patient per Randall GipAmy Esterwood PA that he still has stool in the right side of the colon. Amy thinks he should take 1 more colon prep to empy his colon.  The patient said he will come this afternoon to pick it up. He thanked me for calling him back with this information.  I told him to call me after he does this bowel purge.

## 2016-05-12 DIAGNOSIS — R4689 Other symptoms and signs involving appearance and behavior: Secondary | ICD-10-CM | POA: Diagnosis not present

## 2016-05-12 DIAGNOSIS — R197 Diarrhea, unspecified: Secondary | ICD-10-CM | POA: Diagnosis not present

## 2016-05-12 DIAGNOSIS — F332 Major depressive disorder, recurrent severe without psychotic features: Secondary | ICD-10-CM | POA: Diagnosis not present

## 2016-05-13 DIAGNOSIS — R4689 Other symptoms and signs involving appearance and behavior: Secondary | ICD-10-CM | POA: Diagnosis not present

## 2016-05-13 DIAGNOSIS — R197 Diarrhea, unspecified: Secondary | ICD-10-CM | POA: Diagnosis not present

## 2016-05-14 DIAGNOSIS — R4689 Other symptoms and signs involving appearance and behavior: Secondary | ICD-10-CM | POA: Diagnosis not present

## 2016-05-14 DIAGNOSIS — R197 Diarrhea, unspecified: Secondary | ICD-10-CM | POA: Diagnosis not present

## 2016-05-19 ENCOUNTER — Telehealth: Payer: Self-pay | Admitting: Physician Assistant

## 2016-05-20 NOTE — Telephone Encounter (Signed)
I reviewed Randall [name]'s note when she first sent it to me and again today. I agree that this sound most like chronic constipation with overflow incontinence.  Therefore, we need to rule out an obstructive cause of this. If he is agreeable, please schedule him for a colonoscopy with me at my next available.   Moviprep.  No other suggestions in the meantime.

## 2016-05-20 NOTE — Telephone Encounter (Signed)
Please see the previous office note with Amy Esterwood, PA-C. The patient's wife calls. The patient did do one bowel purge. Stayed in the bathroom "for days". Opted not to do a second purge, hoping the first was sufficient. He is still having good days and bad days. He has control of his bowels one day and then the next he has diarrhea and leakage. The concern is the persistence of the loose stools and "so weak". Discussed hydration and glucose control. Per their statement, both are good. There are no openings prior to the 06/03/16 appointment. Do you have any advice?

## 2016-05-20 NOTE — Telephone Encounter (Signed)
Advised and agrees to this plan. Would like to see Dr Myrtie Neitheranis first however. Watchful waiting for an earlier appointment.

## 2016-05-28 ENCOUNTER — Ambulatory Visit: Payer: Medicare Other | Admitting: Gastroenterology

## 2016-05-31 ENCOUNTER — Encounter: Payer: Self-pay | Admitting: Gastroenterology

## 2016-06-03 ENCOUNTER — Encounter: Payer: Self-pay | Admitting: Gastroenterology

## 2016-06-03 ENCOUNTER — Ambulatory Visit (INDEPENDENT_AMBULATORY_CARE_PROVIDER_SITE_OTHER): Payer: Medicare Other | Admitting: Gastroenterology

## 2016-06-03 ENCOUNTER — Telehealth: Payer: Self-pay | Admitting: Gastroenterology

## 2016-06-03 VITALS — BP 120/60 | HR 80 | Ht 70.0 in | Wt 190.0 lb

## 2016-06-03 DIAGNOSIS — R634 Abnormal weight loss: Secondary | ICD-10-CM

## 2016-06-03 DIAGNOSIS — R197 Diarrhea, unspecified: Secondary | ICD-10-CM

## 2016-06-03 DIAGNOSIS — K909 Intestinal malabsorption, unspecified: Secondary | ICD-10-CM

## 2016-06-03 MED ORDER — PANCRELIPASE (LIP-PROT-AMYL) 25000 UNITS PO CPEP
3.0000 | ORAL_CAPSULE | Freq: Three times a day (TID) | ORAL | Status: DC
Start: 2016-06-03 — End: 2016-06-15

## 2016-06-03 NOTE — Telephone Encounter (Signed)
Per [facility]. The pts cost for a 30 days supply is $271. Alonna BucklerLeslie Wrenn, CMA will work on getting a copay reduction.

## 2016-06-03 NOTE — Patient Instructions (Signed)
  We have sent the following medications to your pharmacy for you to pick up at your convenience: zenpep  Normal BMI (Body Mass Index- based on height and weight) is between 23 and 30. Your BMI today is Body mass index is 27.26 kg/(m^2). Marland Kitchen. Please consider follow up  regarding your BMI with your Primary Care Provider.  I appreciate the opportunity to care for you. Amada JupiterHenry Danis, MD

## 2016-06-03 NOTE — Progress Notes (Signed)
[facility] Progress Note  Chief Complaint: Diarrhea and weight loss  Subjective History:  I reviewed Randall [name]'s office consult note from 04/29/2016. The initial impression was of constipation with overflow incontinence and diarrhea, but the patient did not improve after a bowel prep. In fact, his wife reports he was only able to take half of it because it made him feel quite weak. He continues to have several loose oily stools per day, his weight is down 12 pounds since March. It is down 2 pounds since his visit here 1 month ago. He has some vague abdominal bloating and discomfort that rises up into the chest, no clear triggers or relieving factors. He reports previously drinking 4-6 beers a day for many years until about a year ago, when he cut back.  ROS: Cardiovascular:  no chest pain Respiratory: no dyspnea  The patient's Past Medical, Family and Social History were reviewed and are on file in the EMR.  Objective:  Med list reviewed  Vital signs in last 24 hrs: Filed Vitals:   06/03/16 0846  BP: 120/60  Pulse: 80    Physical Exam His wife is with him today  HEENT: sclera anicteric, oral mucosa moist without lesions. No muscle wasting  Neck: supple, no thyromegaly, JVD or lymphadenopathy  Cardiac: RRR without murmurs, S1S2 heard, no peripheral edema  Pulm: clear to auscultation bilaterally, normal RR and effort noted  Abdomen: soft, no tenderness, with active bowel sounds. No guarding or palpable hepatosplenomegaly. 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  . 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 This no longer appears to be post obstructive diarrhea, even though they seem to recall that it began  with an episode of constipation.  Plan: Zenpep 25,000 units, 3 capsules with meals and 2 with snacks. They will contact us in 3-4 weeks. If he is not improved, colonoscopy will be pursued to rule out overt and microscopic colitis.  Total time 30 minutes, including review of all records counseling and coordinating care. The importance of adherence to thank reticulocyte enzyme regimen was explained in detail. Charlie PitterHenry L Danis [name]

## 2016-06-07 ENCOUNTER — Encounter: Payer: Self-pay | Admitting: Gastroenterology

## 2016-06-07 NOTE — Telephone Encounter (Signed)
Samples have been supplied for pt until we hear from insurance. Zenpep 40,000 samples given to pt. 10 boxes with each box having 12 caps in each box. Directions per Dr Myrtie Neitheranis take 2 with meals and 1 with a snack. Pts wife Ginger has been notified.

## 2016-06-09 ENCOUNTER — Encounter: Payer: Self-pay | Admitting: Physician Assistant

## 2016-06-10 DIAGNOSIS — H04123 Dry eye syndrome of bilateral lacrimal glands: Secondary | ICD-10-CM | POA: Diagnosis not present

## 2016-06-10 DIAGNOSIS — E119 Type 2 diabetes mellitus without complications: Secondary | ICD-10-CM | POA: Diagnosis not present

## 2016-06-14 NOTE — Telephone Encounter (Signed)
Gotcha.  Let's try Creon 36,000 units. 2 capsules with each meal, one with each snack.  Disp #100, RF 2

## 2016-06-14 NOTE — Telephone Encounter (Signed)
We tried to lower the copay for the patient. However we had no luck with getting a tear reduction completed. Verlon AuLeslie asks if you want to try Creon. And see if its covered at a better rate.

## 2016-06-15 ENCOUNTER — Other Ambulatory Visit: Payer: Self-pay

## 2016-06-15 MED ORDER — PANCRELIPASE (LIP-PROT-AMYL) 36000-114000 UNITS PO CPEP: 36000.0000 [IU] | ORAL_CAPSULE | Freq: Three times a day (TID) | ORAL | Status: DC

## 2016-06-15 NOTE — Telephone Encounter (Signed)
Rx sent to pharmacy. Will await to hear from the pharmacy. Left a message pt to return call.

## 2016-06-24 DIAGNOSIS — L720 Epidermal cyst: Secondary | ICD-10-CM | POA: Diagnosis not present

## 2016-06-25 NOTE — Telephone Encounter (Signed)
Tried to call pt for follow up. Phone number on file isn't working properly. I then called [facility]. Mr Randall [name] did pick up the Creon. Co-pay was a  $329.

## 2016-06-27 DIAGNOSIS — E11319 Type 2 diabetes mellitus with unspecified diabetic retinopathy without macular edema: Secondary | ICD-10-CM | POA: Diagnosis not present

## 2016-06-28 NOTE — Telephone Encounter (Signed)
Wife Ginger returned call. She said the Creon was just as expensive to pick up from the pharmacy. For 1/2 month supply is was over $300. I will work with Creon and see if I can get a discounted copay.

## 2016-06-28 NOTE — Telephone Encounter (Signed)
Mailed all info needed for the assistance program to pts address on file.

## 2016-07-01 NOTE — Telephone Encounter (Signed)
Samples of Creon came to the office today. I got 10 boxes with 12 caps in each box. All 10 have been set up at the front desk for pt pick up. Ginger. Pts wife has been notified and aware. She will pick up today. Ginger is also going to work on the paper work for [employer] assistance.

## 2016-07-07 ENCOUNTER — Telehealth: Payer: Self-pay | Admitting: Gastroenterology

## 2016-07-07 NOTE — Telephone Encounter (Signed)
Thank you for letting me know, glad to hear he is doing well.  It is really all due to the staff's efforts getting him pancreatic enzymes samples and prescription at a decent price.

## 2016-07-07 NOTE — Telephone Encounter (Signed)
FYI Dr Myrtie Neither, the pt called back to notify that he is doing very well. He is would also like to thank our office for being so prompt and courteous.  He states " My wife an I are very impressed."

## 2016-07-09 NOTE — Telephone Encounter (Signed)
6 more boxes of Creon samples have been placed up at front desk for pt pick up. Wife Ginger has been notified. She will complete the assistance forms and bring them back to the office.

## 2016-07-22 DIAGNOSIS — Z125 Encounter for screening for malignant neoplasm of prostate: Secondary | ICD-10-CM | POA: Diagnosis not present

## 2016-07-22 DIAGNOSIS — E11319 Type 2 diabetes mellitus with unspecified diabetic retinopathy without macular edema: Secondary | ICD-10-CM | POA: Diagnosis not present

## 2016-07-22 DIAGNOSIS — E7800 Pure hypercholesterolemia, unspecified: Secondary | ICD-10-CM | POA: Diagnosis not present

## 2016-07-23 DIAGNOSIS — N528 Other male erectile dysfunction: Secondary | ICD-10-CM | POA: Diagnosis not present

## 2016-07-23 DIAGNOSIS — N3941 Urge incontinence: Secondary | ICD-10-CM | POA: Diagnosis not present

## 2016-07-23 DIAGNOSIS — N319 Neuromuscular dysfunction of bladder, unspecified: Secondary | ICD-10-CM | POA: Diagnosis not present

## 2016-07-23 DIAGNOSIS — N401 Enlarged prostate with lower urinary tract symptoms: Secondary | ICD-10-CM | POA: Diagnosis not present

## 2016-07-23 DIAGNOSIS — G20 Parkinson's disease: Secondary | ICD-10-CM | POA: Diagnosis not present

## 2016-07-26 DIAGNOSIS — Z1212 Encounter for screening for malignant neoplasm of rectum: Secondary | ICD-10-CM | POA: Diagnosis not present

## 2016-07-29 DIAGNOSIS — G20 Parkinson's disease: Secondary | ICD-10-CM | POA: Diagnosis not present

## 2016-07-29 DIAGNOSIS — N3281 Overactive bladder: Secondary | ICD-10-CM | POA: Diagnosis not present

## 2016-07-29 DIAGNOSIS — F5221 Male erectile disorder: Secondary | ICD-10-CM | POA: Diagnosis not present

## 2016-07-29 DIAGNOSIS — M179 Osteoarthritis of knee, unspecified: Secondary | ICD-10-CM | POA: Diagnosis not present

## 2016-07-29 DIAGNOSIS — Z0000 Encounter for general adult medical examination without abnormal findings: Secondary | ICD-10-CM | POA: Diagnosis not present

## 2016-07-29 DIAGNOSIS — E11319 Type 2 diabetes mellitus with unspecified diabetic retinopathy without macular edema: Secondary | ICD-10-CM | POA: Diagnosis not present

## 2016-07-29 DIAGNOSIS — E7800 Pure hypercholesterolemia, unspecified: Secondary | ICD-10-CM | POA: Diagnosis not present

## 2016-07-29 DIAGNOSIS — F331 Major depressive disorder, recurrent, moderate: Secondary | ICD-10-CM | POA: Diagnosis not present

## 2016-07-29 DIAGNOSIS — K8689 Other specified diseases of pancreas: Secondary | ICD-10-CM | POA: Diagnosis not present

## 2016-07-29 DIAGNOSIS — Z6825 Body mass index (BMI) 25.0-25.9, adult: Secondary | ICD-10-CM | POA: Diagnosis not present

## 2016-08-02 DIAGNOSIS — G20 Parkinson's disease: Secondary | ICD-10-CM | POA: Diagnosis not present

## 2016-08-18 ENCOUNTER — Telehealth: Payer: Self-pay | Admitting: Gastroenterology

## 2016-08-18 NOTE — Telephone Encounter (Signed)
Contacted pts wife Ginger. She states her husband is now out of the donut hole and the cost of Creon is now only 100

## 2016-09-08 DIAGNOSIS — G629 Polyneuropathy, unspecified: Secondary | ICD-10-CM | POA: Diagnosis not present

## 2016-09-08 DIAGNOSIS — Z6825 Body mass index (BMI) 25.0-25.9, adult: Secondary | ICD-10-CM | POA: Diagnosis not present

## 2016-09-09 NOTE — Telephone Encounter (Signed)
DONE

## 2016-09-20 ENCOUNTER — Telehealth: Payer: Self-pay | Admitting: Gastroenterology

## 2016-09-20 NOTE — Telephone Encounter (Signed)
Patient's wife states he is having break through oily diarrhea/incontinence while on Creon. Dr. Myrtie Neitheranis is out of the office all week and 1st available is not until 12/6, offered appointment with Mike GipAmy Esterwood, PA for 10/19.

## 2016-09-23 ENCOUNTER — Encounter: Payer: Self-pay | Admitting: Physician Assistant

## 2016-09-23 ENCOUNTER — Ambulatory Visit (INDEPENDENT_AMBULATORY_CARE_PROVIDER_SITE_OTHER): Payer: Medicare Other | Admitting: Physician Assistant

## 2016-09-23 VITALS — BP 132/80 | HR 71 | Ht 70.0 in | Wt 183.0 lb

## 2016-09-23 DIAGNOSIS — R634 Abnormal weight loss: Secondary | ICD-10-CM | POA: Diagnosis not present

## 2016-09-23 DIAGNOSIS — K529 Noninfective gastroenteritis and colitis, unspecified: Secondary | ICD-10-CM | POA: Diagnosis not present

## 2016-09-23 NOTE — Patient Instructions (Addendum)
Start Imodium- Take 1 tab each morning before breakfast.  If you can get the Creon 36000, take 2 with meals and 1 with snacks.   You have been scheduled for a colonoscopy. Please follow written instructions given to you at your visit today.  Please pick up your prep supplies at the pharmacy within the next 1-3 days. [facility]. If you use inhalers (even only as needed), please bring them with you on the day of your procedure. Your physician has requested that you go to www.startemmi.com and enter the access code given to you at your visit today. This web site gives a general overview about your procedure. However, you should still follow specific instructions given to you by our office regarding your preparation for the procedure.

## 2016-09-23 NOTE — Progress Notes (Signed)
Subjective:    Patient ID: Randall [name], male    DOB: 1941-07-29, 75 y.o.   MRN: [id number]  HPI Randall [name]  is a pleasant 75 year old white male now established with Dr. Myrtie Neither. 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 Patient also has Parkinson's disease, is status post deep brain stimulator placement and has a Medtronic device. He is followed at [facility]. Patient had been started on Creon 36,000 units, 2 by mouth with each meal and one with snacks about 3 months ago for complaints of diarrhea intermittent incontinence and oily-type stools. He had a good response to Creon fairly quickly. He and his wife come back in today stating that over the past couple of weeks he has had problems with recurrent incontinence and increased frequency of stooling. He denies any abdominal pain, appetite has been good but weight is down a few more pounds. Patient is frustrated because he says his bowel habits are very unpredictable. He says he may have urgency for bowel movement several times per day makes a trip to the bathroom and then is unable to produce any stool, and then 5 minutes later may have urgency and incontinence without any warning. On careful questioning still only having 2-3 bowel movements per day and his stools are mushy, not liquid but the intermittent urgency and incontinence seems to be the issue. [name] He is also having intermittent nocturnal incontinence of stool. Pt  has not had any changes in his medications altered dosages antibiotics etc.  Metformin is on his med list but he has not been taking this for several months.  Review of Systems Pertinent positive and negative review of systems were noted in the above HPI section.  All other review of systems was otherwise negative.  Outpatient Encounter Prescriptions as of 09/23/2016  Medication Sig  . Carbidopa-Levodopa ER (SINEMET CR) 25-100 MG tablet controlled release Take 1.5  tablets by mouth 3 (three) times daily.  Marland Kitchen donepezil (ARICEPT) 5 MG tablet Take 5 mg by mouth at bedtime.  . fesoterodine (TOVIAZ) 8 MG TB24 tablet Take 8 mg by mouth daily.  . furosemide (LASIX) 20 MG tablet Take 20 mg by mouth daily. For edema/HTN  . Insulin NPH Human, Isophane, (HUMULIN N KWIKPEN [place]) Inject 50 Units into the skin 3 (three) times daily.  . lipase/protease/amylase (CREON) 36000 UNITS CPEP capsule Take 1 capsule (36,000 Units total) by mouth 3 (three) times daily with meals. Take 2 with each meal and 1 with snacks.  . metFORMIN (GLUCOPHAGE-XR) 500 MG 24 hr tablet Take 500-1,000 mg by mouth 2 (two) times daily. Takes 1000mg  in the morning and 500mg  in the evening. For DM  . Probiotic Product (ALIGN) 4 MG CAPS Take 1 capsule by mouth daily.  Marland Kitchen rOPINIRole (REQUIP XL) 8 MG 24 hr tablet Take 8 mg by mouth every morning. Treats Parkinson's disease  . simvastatin (ZOCOR) 40 MG tablet Take 40 mg by mouth daily. Lowers cholesterol  . venlafaxine XR (EFFEXOR XR) 75 MG 24 hr capsule Take 225 mg by mouth daily with breakfast.    No facility-administered encounter medications on file as of 09/23/2016.    Allergies  Allergen Reactions  . Iodine Nausea And Vomiting    CT trace   . Shellfish Allergy Nausea And Vomiting   Patient Active Problem List   Diagnosis Date Noted  . Wound infection after surgery 03/09/2014  . 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 Hyperlipidemia   . Incontinence  of urine   . Parkinson disease (HCC) 02/21/2014  . 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 Diabetes (HCC) 02/19/2014  . 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 Marital status: Married    Spouse name: N/A  . Number of children: N/A  . Years of education: N/A   Occupational History  . Not on file.   Social History Main Topics  . Smoking status: Never Smoker  . Smokeless tobacco: Never  Used  . Alcohol use 12.0 oz/week    20 Cans of beer per week     Comment: 1-2 beers a day  . Drug use: No  . Sexual activity: Not on file   Other Topics Concern  . Not on file   Social History Narrative  . No narrative on file    Randall [name]'s family history is not on file.      Objective:    Vitals:   09/23/16 1440  BP: 132/80  Pulse: 71    Physical Exam well-developed elderly white male in no acute distress, accompanied by his wife. Blood pressure 132/80 pulse 71 height 5 foot 10 weight 183 BMI 26.2, weight down 7 pounds from last office visit in June. HEENT; nontraumatic normocephalic EOMI PERRLA sclera anicteric, Cardiovascular; regular rate and rhythm with S1-S2 no murmur or gallop, Pulmonary; or bilaterally, Abdomen ;soft nontender nondistended bowel sounds active no palpable mass or hepatosplenomegaly, Rectal; exam not done, Extremities; no clubbing cyanosis or edema skin warm and dry, Neuropsych;. Patient has Parkinson's facies and pill-rolling movements of his hands, speech slow but appropriate       Assessment & Plan:   #411  75 year old white male with Parkinson's disease with chronic complaints of diarrhea, and intermittent fecal incontinence. He has some symptoms consistent with pancreatic insufficiency and initially had good response to Creon. Presents now with mild increase in frequency of stools and increased episodes of incontinence. I'm not certain that his incontinence is related to diarrhea and suspect this may be more neurogenic with advanced Parkinson's. He has had issues with urinary continence for the past few years. Patient has not had colonoscopy for many years and also need to rule out an underlying colitis or microscopic colitis. #2 adult-onset diabetes mellitus #3 status post deep brain stimulator placement Medtronic pacer device in right chest, [facility]DBS coordinator is Cleora FleetChristy Wilson [phone]. Patient and wife concerned about  possible interference with anesthesia  Plan; for now will continue Creon at current dose 36,000 units, 2 by mouth with meals and 1 by mouth before meals snacks Add Imodium one by mouth every morning We'll schedule for colonoscopy with Dr. Myrtie Neitheranis. With plans for random biopsies. Procedure discussed in detail with the patient and his wife and they're agreeable to proceed. We will contact [facility] and discuss any possible issues with anesthesia.      Shanikia Kernodle Oswald HillockS Christinamarie Tall PA-C 09/23/2016   Cc: Tisovec, Adelfa Kohichard W, MD

## 2016-09-24 NOTE — Progress Notes (Signed)
Thank you for sending this case to me. I have reviewed the entire note, and the outlined plan seems appropriate.  I agree that this is most likely neurogenic related to PD.  Colonoscopy to rule out overt or microscopic colitis is appropriate.

## 2016-09-26 DIAGNOSIS — E11319 Type 2 diabetes mellitus with unspecified diabetic retinopathy without macular edema: Secondary | ICD-10-CM | POA: Diagnosis not present

## 2016-09-27 ENCOUNTER — Telehealth: Payer: Self-pay

## 2016-09-27 NOTE — Telephone Encounter (Signed)
Spoke to VF CorporationChristy Wilson @ [facility], [phone], she at first advised that if leaving the DBS on, they advise not to use any cautery with the procedure, cold snare is okay. Use of moderate sedation with propofol was okay.  She then said that the device could be turned off just prior to taking patient back to the procedure room and turned back on when in recovery.She felt that since the patient has had this device for some time they would be knowledgeable about how to do this. She also suggested calling Medtronic if help was needed. Called patient's wife back, she is a little anxious about this and was going to call Neysa BonitoChristy to have her walk her through it.

## 2016-09-27 NOTE — Telephone Encounter (Signed)
Spoke to patient, let him know that Dr. Myrtie Neitheranis stated that he can leave the stimulator on during the colonoscopy, he does not plan on using cautery. Let patient know if they had any questions or concerns to please call the office.

## 2016-09-27 NOTE — Telephone Encounter (Signed)
-----   Message from Donata DuffPatty L Lewis, RN sent at 09/24/2016  1:03 PM EDT ----- Regarding: RE: call [facility] I will send to Raynelle FanningJulie, I am not Dr Irving Burtonanis's nurse now :)  ----- Message ----- From: Sammuel CooperAmy S Esterwood, PA-C Sent: 09/24/2016  12:20 PM To: Donata DuffPatty L Lewis, RN Subject: call [facility]                          This pt is a Danis pt- he is scheduled for upcoming colonoscopy. He has Parkinson's disease and had a deep brain stimulator device placed  [facility] . Pt and wife are concerned about any potential issues or interference with anesthesia. Can you please call the deep brain stimulator coordinator - Cleora FleetChristy Wilson at [facility] - Her phone number is at the bottom of my office note- and asl whether there is any issue with moderate sedation with propofol  for a colonoscopy. Thanks- let me know

## 2016-09-27 NOTE — Telephone Encounter (Signed)
Thank you for the info.   I do not anticipate using cautery during this procedure.  Any biopsies that may be necessary will not affect the stimulator.    I think they should just leave the device turned on since Ms Randall [name] does not anticipate any problem with propofol sedation.

## 2016-09-28 ENCOUNTER — Encounter: Payer: Self-pay | Admitting: Gastroenterology

## 2016-09-28 DIAGNOSIS — L723 Sebaceous cyst: Secondary | ICD-10-CM | POA: Diagnosis not present

## 2016-09-28 DIAGNOSIS — L218 Other seborrheic dermatitis: Secondary | ICD-10-CM | POA: Diagnosis not present

## 2016-09-28 DIAGNOSIS — L821 Other seborrheic keratosis: Secondary | ICD-10-CM | POA: Diagnosis not present

## 2016-09-28 NOTE — Telephone Encounter (Signed)
Thanks for follow up , and glad Dr Myrtie Neitheranis aware

## 2016-10-04 ENCOUNTER — Encounter: Payer: Self-pay | Admitting: Gastroenterology

## 2016-10-04 ENCOUNTER — Telehealth: Payer: Self-pay | Admitting: Physician Assistant

## 2016-10-04 NOTE — Telephone Encounter (Signed)
Patient's wife called and wanted to let Dr. Myrtie Neitheranis know that some of his regular medicines can cause prolonged bleeding. She did not know which one in particular and patient is not on a blood thinner. She just wanted to make us aware.

## 2016-10-05 ENCOUNTER — Other Ambulatory Visit: Payer: Self-pay

## 2016-10-05 ENCOUNTER — Telehealth: Payer: Self-pay

## 2016-10-05 MED ORDER — NA SULFATE-K SULFATE-MG SULF 17.5-3.13-1.6 GM/177ML PO SOLN: 1.0000 | Freq: Once | ORAL | 0 refills | Status: AC

## 2016-10-05 NOTE — Telephone Encounter (Signed)
Thanks for her concern.  I have reviewed his medicines from the last visit, and I think we will be OK

## 2016-10-05 NOTE — Telephone Encounter (Signed)
Called pharmacy to see what happened to prep, they do not have record of it from last week. Gave verbal order for [employer]. Pharmacist said that patient's insurance will not cover the cost and wanted to know if generic would be okay. Instructed that they need to fill as ordered and will have patient pick up coupon for discount. Called wife, lvm to pick up coupon at front desk.

## 2016-10-06 ENCOUNTER — Ambulatory Visit: Payer: Medicare Other | Admitting: Gastroenterology

## 2016-10-06 ENCOUNTER — Encounter: Payer: Self-pay | Admitting: Gastroenterology

## 2016-10-06 VITALS — BP 120/72 | HR 98 | Temp 98.6°F | Ht 60.0 in | Wt 183.0 lb

## 2016-10-06 LAB — GLUCOSE, CAPILLARY
GLUCOSE-CAPILLARY: 30 mg/dL — AB (ref 65–99)
Glucose-Capillary: 122 mg/dL — ABNORMAL HIGH (ref 65–99)
Glucose-Capillary: 74 mg/dL (ref 65–99)

## 2016-10-06 MED ORDER — SODIUM CHLORIDE 0.9 % IV SOLN: 500.0000 mL | INTRAVENOUS | Status: DC

## 2016-10-06 MED ORDER — DEXTROSE 50 % IV SOLN: 25.0000 mL | Freq: Once | INTRAVENOUS | Status: DC

## 2016-10-06 MED ORDER — DEXTROSE 5 % IV SOLN: INTRAVENOUS | Status: DC

## 2016-10-06 NOTE — Progress Notes (Signed)
CBG of 37 obtained from finger stick. Dr Myrtie Neitheranis notified. Dr in room to see pt. States not doing procedure today, to start IV and give 1/2 amp of D50, recheck CBG in 30 minutes. Ok to give pt juice and crackers. Wife at bedside. IV started #22 gauge started in right forearm. 25ml of D50 pushed over 4 minutes. Patient alert, speaking clearly. Peanut butter crackers and apple juice given. No complaints at this time.

## 2016-10-06 NOTE — Progress Notes (Signed)
Patient appeared symptomatic from hyoglycemia, with increased tremor.  1/2 amp D50 given, good improvement in FSBS to >70 within 20 minutes, then 110 after 45-60 minutes. I canceled the colonoscopy and allowed him to eat and drink.  I called Dr Deneen Hartsisovec's office.  He was unable to talk while in with a patient, so his MA relayed a message re: my request for advice on what Mr Randall [name] should do with his insulin over the next 24 hrs.  He called back while I was in my next procedure and gave instructions, which we gave to his wife in written form. I spoke with Mr Randall [name] and his wife about the recent symptoms.  It sounds to me like ano-rectal motility issues from PD or DM or both.  I think he is on sufficient pancreatic enzyme supplements.  Given today's events, we agreed to forego a future colonoscopy and use imodium daily.He will see me as needed.

## 2016-10-07 LAB — GLUCOSE, CAPILLARY: GLUCOSE-CAPILLARY: 36 mg/dL — AB (ref 65–99)

## 2016-10-13 ENCOUNTER — Telehealth: Payer: Self-pay

## 2016-10-13 ENCOUNTER — Other Ambulatory Visit: Payer: Self-pay

## 2016-10-13 MED ORDER — PANCRELIPASE (LIP-PROT-AMYL) 36000-114000 UNITS PO CPEP: ORAL_CAPSULE | ORAL | 11 refills | Status: DC

## 2016-10-13 NOTE — Telephone Encounter (Signed)
Refilled as directed by Dr Danis  

## 2016-10-13 NOTE — Telephone Encounter (Signed)
Pt needs refills on Creon 36,000. 2 with meals 1 with snack. Ok to refill?

## 2016-10-13 NOTE — Telephone Encounter (Signed)
Yes, please.  A month ( or 3 month supply if he prefers), and a year of refills.  Thanks

## 2016-10-21 DIAGNOSIS — L720 Epidermal cyst: Secondary | ICD-10-CM | POA: Diagnosis not present

## 2016-10-25 DIAGNOSIS — Z79899 Other long term (current) drug therapy: Secondary | ICD-10-CM | POA: Diagnosis not present

## 2016-10-25 DIAGNOSIS — Z9689 Presence of other specified functional implants: Secondary | ICD-10-CM | POA: Diagnosis not present

## 2016-10-25 DIAGNOSIS — G20 Parkinson's disease: Secondary | ICD-10-CM | POA: Diagnosis not present

## 2016-11-03 ENCOUNTER — Other Ambulatory Visit: Payer: Self-pay

## 2016-11-03 MED ORDER — PANCRELIPASE (LIP-PROT-AMYL) 40000-136000 UNITS PO CPEP: ORAL_CAPSULE | ORAL | 0 refills | Status: DC

## 2016-11-05 ENCOUNTER — Encounter: Payer: Self-pay | Admitting: Gastroenterology

## 2016-11-05 ENCOUNTER — Telehealth: Payer: Self-pay

## 2016-11-05 NOTE — Telephone Encounter (Signed)
Called patient back, lvm that the Zenpep boxes are 2 free sample boxes from the rep. Asked that if they had any questions to call back and speak to Elon Spanneroni Todd, CMA, as she was the one who arranged this with the rep.

## 2016-11-10 ENCOUNTER — Encounter: Payer: Self-pay | Admitting: Gastroenterology

## 2016-11-10 ENCOUNTER — Ambulatory Visit (INDEPENDENT_AMBULATORY_CARE_PROVIDER_SITE_OTHER): Payer: Medicare Other | Admitting: Gastroenterology

## 2016-11-10 VITALS — BP 106/50 | HR 88 | Ht 69.25 in | Wt 184.0 lb

## 2016-11-10 DIAGNOSIS — R197 Diarrhea, unspecified: Secondary | ICD-10-CM | POA: Diagnosis not present

## 2016-11-10 DIAGNOSIS — K8689 Other specified diseases of pancreas: Secondary | ICD-10-CM

## 2016-11-10 DIAGNOSIS — K909 Intestinal malabsorption, unspecified: Secondary | ICD-10-CM

## 2016-11-10 NOTE — Progress Notes (Signed)
[facility] Progress Note  Chief Complaint: Pancreatic insufficiency causing diarrhea  Subjective  History:  It was seen along with his wife today. He is doing very well on Creon supplements, 2 capsules with meals, one with snacks. He has occasional episodes of loose stool, but that seems more related to inattention to bowel habits more than insufficient enzyme replacement. His appetite is been good and his weight stable. He continues to have a beer every evening. In early November he came for a planned colonoscopy because of ongoing diarrhea, but he had a very low blood sugar and the procedure was canceled. In speaking to him and his wife further, it sounded like a conservative management with as needed Imodium was better. Therefore, the procedure was not rescheduled in the are comfortable with that decision.  ROS: Cardiovascular:  no chest pain Respiratory: no dyspnea  The patient's Past Medical, Family and Social History were reviewed and are on file in the EMR.  Objective:  Med list reviewed  Vital signs in last 24 hrs: Vitals:   11/10/16 1103  BP: (!) 106/50  Pulse: 88    Physical Exam    HEENT: sclera anicteric, oral mucosa moist without lesions  Neck: supple, no thyromegaly, JVD or lymphadenopathy  Cardiac: RRR without murmurs, S1S2 heard, no peripheral edema  Pulm: clear to auscultation bilaterally, normal RR and effort noted  Abdomen: soft, No tenderness, with active bowel sounds. No guarding or palpable hepatosplenomegaly.  Skin; warm and dry, no jaundice or rash    @ASSESSMENTPLANBEGIN @ Assessment: Encounter Diagnoses  Name Primary?  . Pancreatic insufficiency Yes  . Diarrhea due to malabsorption      Plan: Continue current enzyme replacement As needed Imodium See me in 6 months or sooner as needed   Total time 15 minutes, over half spent in counseling and coordination of care.   Randall [name]

## 2016-11-10 NOTE — Patient Instructions (Signed)
Follow up in 6 months.  If you are age 76 or older, your body mass index should be between 23-30. Your Body mass index is 26.98 kg/m. If this is out of the aforementioned range listed, please consider follow up with your Primary Care Provider.  If you are age 76 or younger, your body mass index should be between 19-25. Your Body mass index is 26.98 kg/m. If this is out of the aformentioned range listed, please consider follow up with your Primary Care Provider.   Thank you for choosing [facility]  Dr Amada JupiterHenry Danis III

## 2016-11-24 DIAGNOSIS — F3341 Major depressive disorder, recurrent, in partial remission: Secondary | ICD-10-CM | POA: Diagnosis not present

## 2016-11-24 DIAGNOSIS — F5232 Male orgasmic disorder: Secondary | ICD-10-CM | POA: Diagnosis not present

## 2016-12-01 ENCOUNTER — Telehealth: Payer: Self-pay | Admitting: Gastroenterology

## 2016-12-01 NOTE — Telephone Encounter (Signed)
Patient advised of Dr. Myrtie Neitheranis' recommendations, he last took imodium just before Christmas, none since. He understands directions to get back into his normal routine, wait 2-3 days then may start ducosate 100 mg bid.

## 2016-12-01 NOTE — Telephone Encounter (Signed)
Sounds like he should stop the imodium.   If has not been taking it, or if stops and things not better in 2-3 days, begin ducosate 100 mg twice daily.  I would rather not start potent laxatives since he tends to diarrhea and incontinence from the pancreatic problem.

## 2016-12-01 NOTE — Telephone Encounter (Signed)
Spoke to patient, he has had LLQ pain for past 2 days. States that they have been traveling on short trips and has not been able to have complete bm's. Is able to tolerate food, drink, denies fever, N/V. Patient is still taking the creon as directed but wondered if he should add a stool softener or any other suggestions you have. Please advise.

## 2016-12-16 DIAGNOSIS — H524 Presbyopia: Secondary | ICD-10-CM | POA: Diagnosis not present

## 2016-12-16 DIAGNOSIS — H04123 Dry eye syndrome of bilateral lacrimal glands: Secondary | ICD-10-CM | POA: Diagnosis not present

## 2016-12-16 DIAGNOSIS — E119 Type 2 diabetes mellitus without complications: Secondary | ICD-10-CM | POA: Diagnosis not present

## 2016-12-16 DIAGNOSIS — E11319 Type 2 diabetes mellitus with unspecified diabetic retinopathy without macular edema: Secondary | ICD-10-CM | POA: Diagnosis not present

## 2016-12-29 DIAGNOSIS — G20 Parkinson's disease: Secondary | ICD-10-CM | POA: Diagnosis not present

## 2016-12-29 DIAGNOSIS — Z9689 Presence of other specified functional implants: Secondary | ICD-10-CM | POA: Diagnosis not present

## 2016-12-29 DIAGNOSIS — Z79899 Other long term (current) drug therapy: Secondary | ICD-10-CM | POA: Diagnosis not present

## 2016-12-29 DIAGNOSIS — E785 Hyperlipidemia, unspecified: Secondary | ICD-10-CM | POA: Diagnosis not present

## 2016-12-29 DIAGNOSIS — E119 Type 2 diabetes mellitus without complications: Secondary | ICD-10-CM | POA: Diagnosis not present

## 2016-12-29 DIAGNOSIS — Z794 Long term (current) use of insulin: Secondary | ICD-10-CM | POA: Diagnosis not present

## 2016-12-29 DIAGNOSIS — R296 Repeated falls: Secondary | ICD-10-CM | POA: Diagnosis not present

## 2016-12-29 DIAGNOSIS — G252 Other specified forms of tremor: Secondary | ICD-10-CM | POA: Diagnosis not present

## 2017-02-16 ENCOUNTER — Telehealth: Payer: Self-pay

## 2017-02-17 NOTE — Telephone Encounter (Signed)
Pharmacy needed ok to fill Creon at quantity of 200 a month as there is 100 caps in a bottle.

## 2017-02-17 NOTE — Telephone Encounter (Signed)
Spoke to pts wife and informed her that Creon information was given to the pharmacy. Also pt to pick up 2 boxes of Creon samples (36000units per cap)

## 2017-02-24 ENCOUNTER — Other Ambulatory Visit: Payer: Self-pay | Admitting: Gastroenterology

## 2017-02-24 NOTE — Telephone Encounter (Signed)
Needs rx of zenpep/creon to reflect quantity of 200. Okay to refill

## 2017-03-09 DIAGNOSIS — F3341 Major depressive disorder, recurrent, in partial remission: Secondary | ICD-10-CM | POA: Diagnosis not present

## 2017-03-09 DIAGNOSIS — F5232 Male orgasmic disorder: Secondary | ICD-10-CM | POA: Diagnosis not present

## 2017-04-06 DIAGNOSIS — E11319 Type 2 diabetes mellitus with unspecified diabetic retinopathy without macular edema: Secondary | ICD-10-CM | POA: Diagnosis not present

## 2017-04-29 ENCOUNTER — Telehealth: Payer: Self-pay | Admitting: Gastroenterology

## 2017-04-29 NOTE — Telephone Encounter (Signed)
3 boxes of pancreatic enzymes given to the pt.

## 2017-05-09 DIAGNOSIS — G20 Parkinson's disease: Secondary | ICD-10-CM | POA: Diagnosis not present

## 2017-05-09 DIAGNOSIS — N401 Enlarged prostate with lower urinary tract symptoms: Secondary | ICD-10-CM | POA: Diagnosis not present

## 2017-05-09 DIAGNOSIS — N138 Other obstructive and reflux uropathy: Secondary | ICD-10-CM | POA: Diagnosis not present

## 2017-05-09 DIAGNOSIS — N319 Neuromuscular dysfunction of bladder, unspecified: Secondary | ICD-10-CM | POA: Diagnosis not present

## 2017-05-12 DIAGNOSIS — M25552 Pain in left hip: Secondary | ICD-10-CM | POA: Diagnosis not present

## 2017-05-12 DIAGNOSIS — Z6825 Body mass index (BMI) 25.0-25.9, adult: Secondary | ICD-10-CM | POA: Diagnosis not present

## 2017-05-26 DIAGNOSIS — M545 Low back pain: Secondary | ICD-10-CM | POA: Diagnosis not present

## 2017-06-10 DIAGNOSIS — G20 Parkinson's disease: Secondary | ICD-10-CM | POA: Diagnosis not present

## 2017-07-25 DIAGNOSIS — E11319 Type 2 diabetes mellitus with unspecified diabetic retinopathy without macular edema: Secondary | ICD-10-CM | POA: Diagnosis not present

## 2017-07-25 DIAGNOSIS — E7800 Pure hypercholesterolemia, unspecified: Secondary | ICD-10-CM | POA: Diagnosis not present

## 2017-07-25 DIAGNOSIS — Z125 Encounter for screening for malignant neoplasm of prostate: Secondary | ICD-10-CM | POA: Diagnosis not present

## 2017-08-01 DIAGNOSIS — G6289 Other specified polyneuropathies: Secondary | ICD-10-CM | POA: Diagnosis not present

## 2017-08-01 DIAGNOSIS — E11319 Type 2 diabetes mellitus with unspecified diabetic retinopathy without macular edema: Secondary | ICD-10-CM | POA: Diagnosis not present

## 2017-08-01 DIAGNOSIS — Z0000 Encounter for general adult medical examination without abnormal findings: Secondary | ICD-10-CM | POA: Diagnosis not present

## 2017-08-01 DIAGNOSIS — Z23 Encounter for immunization: Secondary | ICD-10-CM | POA: Diagnosis not present

## 2017-08-01 DIAGNOSIS — K8681 Exocrine pancreatic insufficiency: Secondary | ICD-10-CM | POA: Diagnosis not present

## 2017-08-02 DIAGNOSIS — Z1212 Encounter for screening for malignant neoplasm of rectum: Secondary | ICD-10-CM | POA: Diagnosis not present

## 2017-08-25 DIAGNOSIS — E11319 Type 2 diabetes mellitus with unspecified diabetic retinopathy without macular edema: Secondary | ICD-10-CM | POA: Diagnosis not present

## 2017-10-17 IMAGING — DX DG ABDOMEN 2V
4 series · 4 of 4 positions shown · non-contrast
Comparison: 11/20/2009.

CLINICAL DATA: Constipation and diarrhea.

EXAM:
ABDOMEN - 2 VIEW

[abdomen erect (1 of 2)]
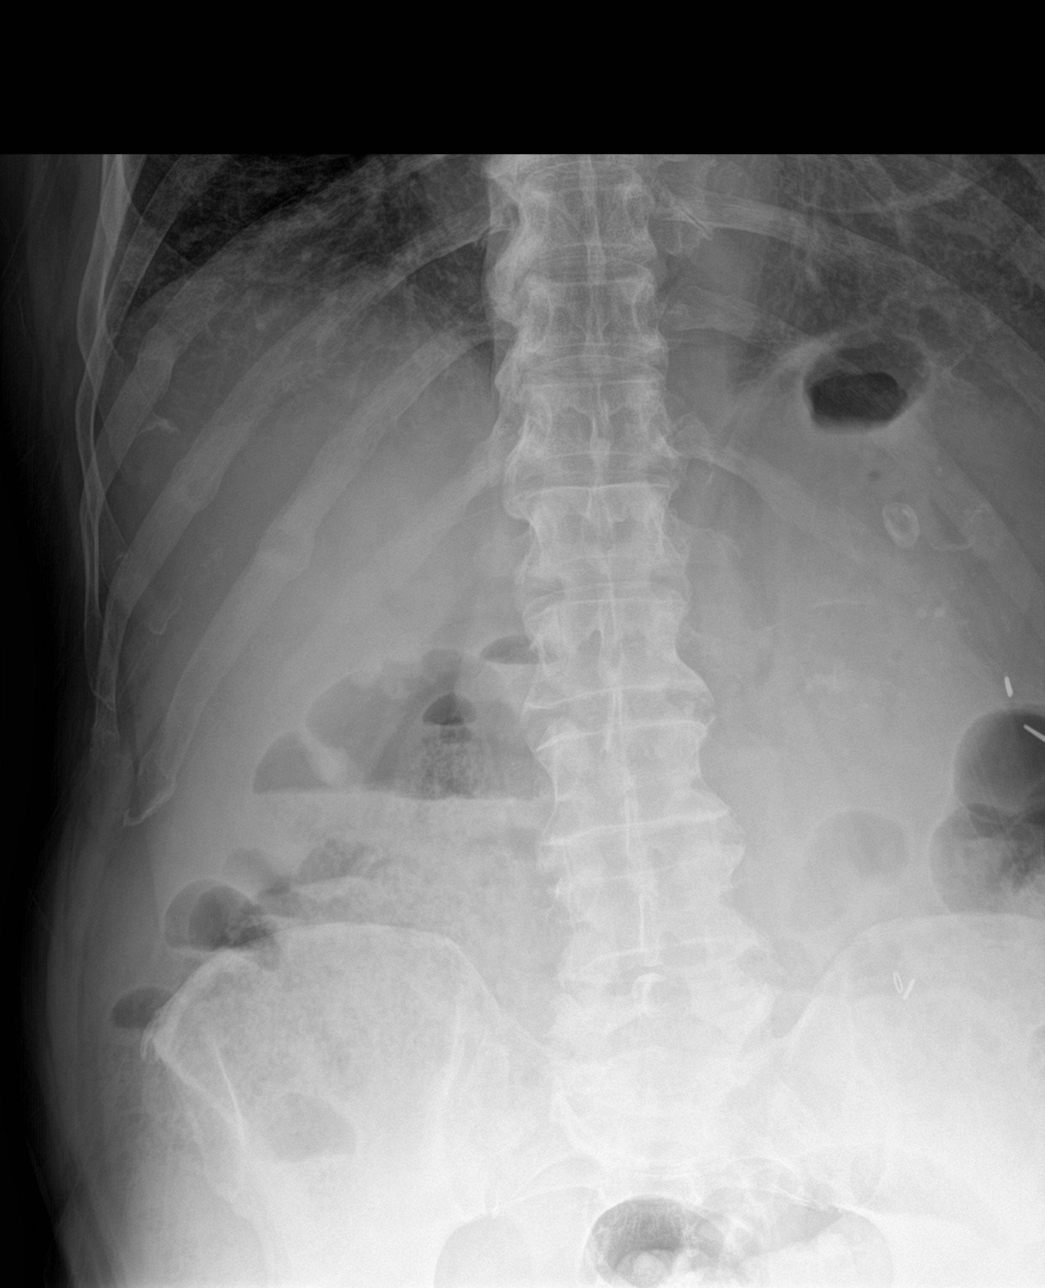

[abdomen supine (1 of 2)]
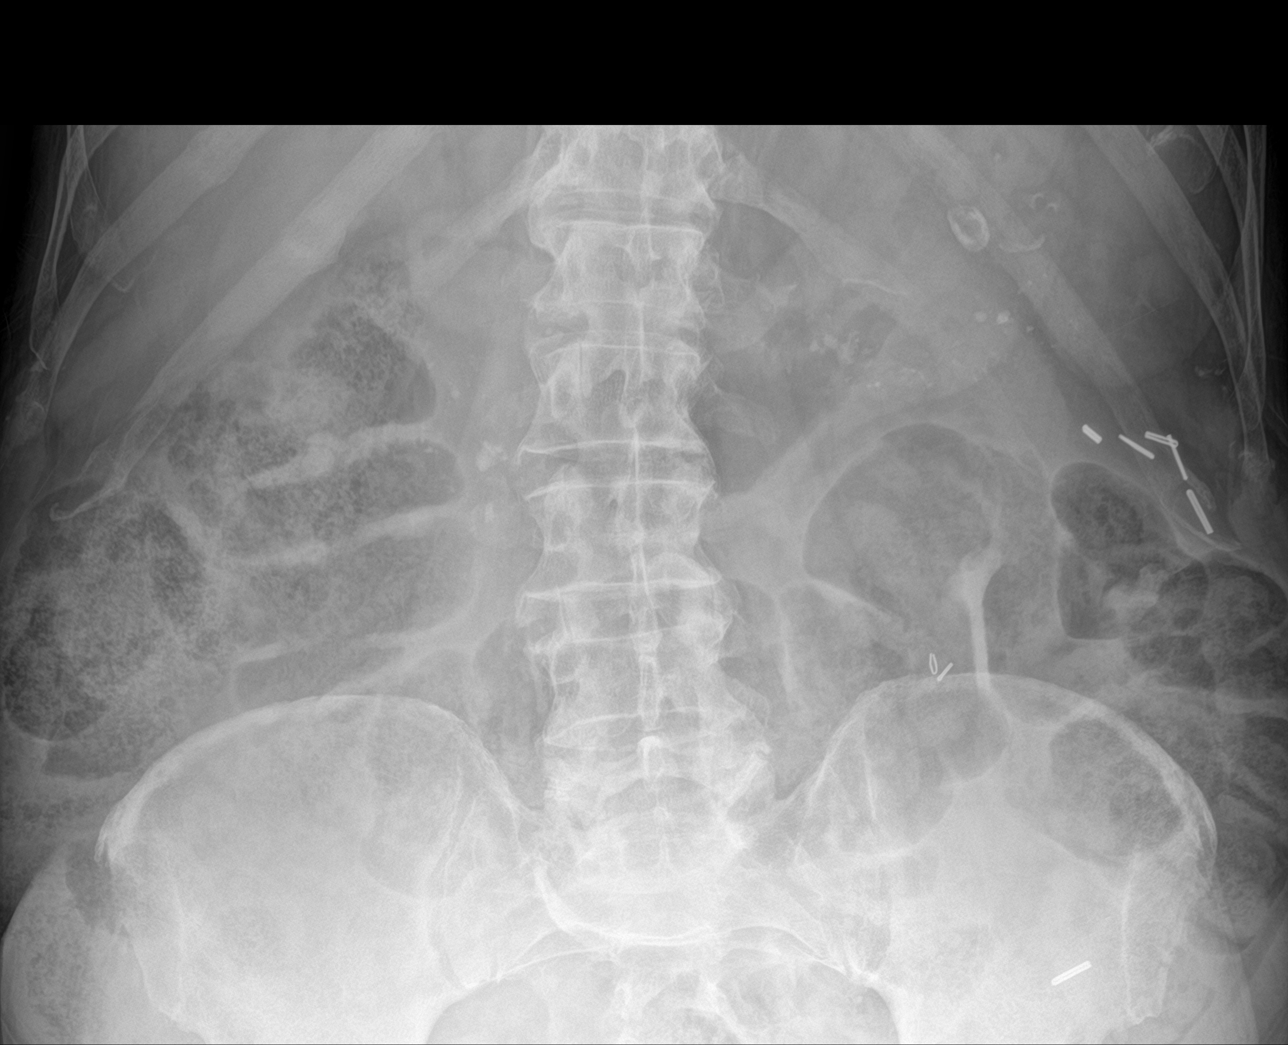

[abdomen supine (2 of 2)]
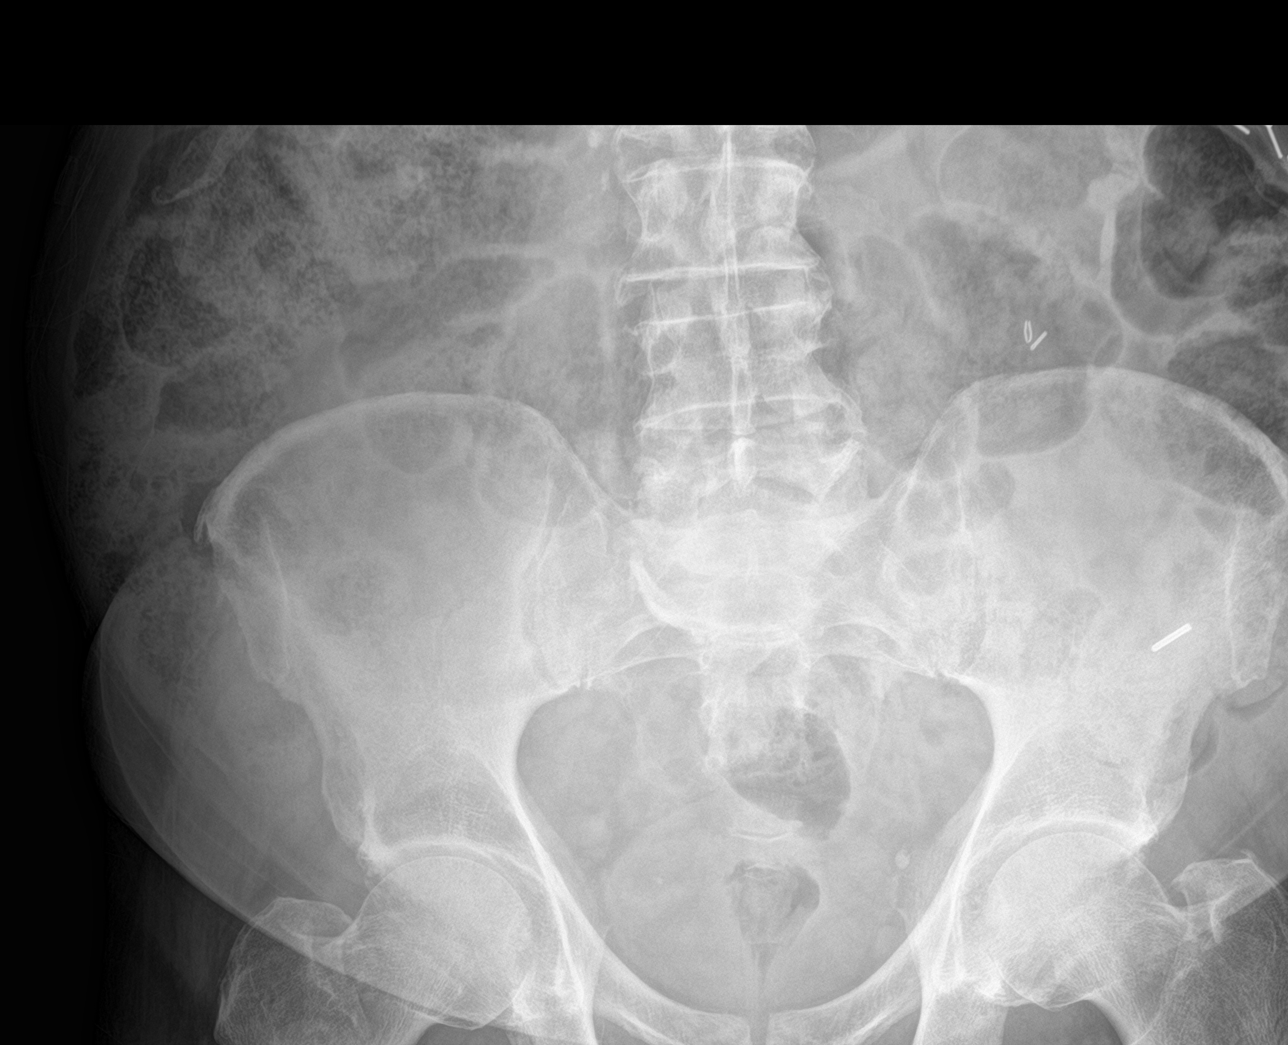

[abdomen erect (2 of 2)]
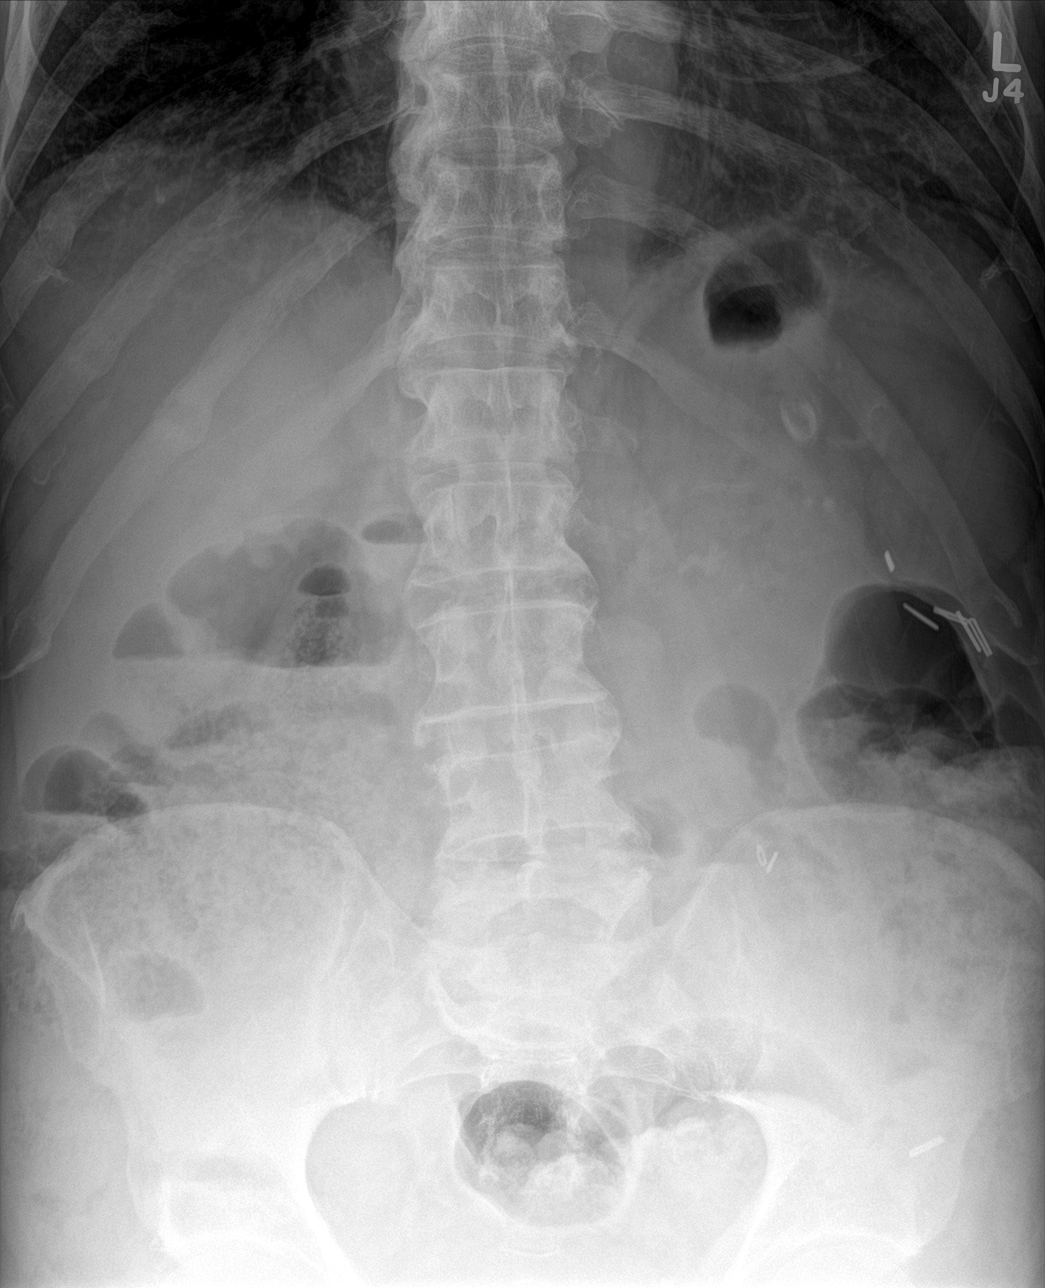

[4 of 4 positions shown; findings below may reference images not displayed]

FINDINGS: Surgical clips over the abdomen. Soft tissue structures are
unremarkable. Surgical clips noted over the abdomen. Stool noted
throughout the colon. Constipation cannot be excluded. No free air.
Aortoiliac at and visceral vascular calcification. Probable 1.6 cm
splenic artery aneurysm. Pelvic calcifications consistent
phleboliths. Degenerative changes lumbar spine and both hips. Mild
colonic distention.
IMPRESSION: 1. Prominent amount of stool noted throughout the colon.
Constipation cannot be excluded. Mild distention of the colon is
present. No free air.
2. Aortoiliac and visceral including renal artery atherosclerotic
vascular disease. Probable 1.6 cm splenic artery aneurysm.

## 2017-10-25 ENCOUNTER — Telehealth: Payer: Self-pay | Admitting: Gastroenterology

## 2017-10-25 NOTE — Telephone Encounter (Signed)
Patient wife states pt has just reordered his medication creon but thinks he might run out through thanksgiving, and wants to know if he could get a sample.

## 2017-10-25 NOTE — Telephone Encounter (Signed)
Pt was given 4 boxes of Creon 36,000 units.

## 2017-11-02 DIAGNOSIS — E11319 Type 2 diabetes mellitus with unspecified diabetic retinopathy without macular edema: Secondary | ICD-10-CM | POA: Diagnosis not present

## 2017-11-02 DIAGNOSIS — K8681 Exocrine pancreatic insufficiency: Secondary | ICD-10-CM | POA: Diagnosis not present

## 2017-11-02 DIAGNOSIS — Z794 Long term (current) use of insulin: Secondary | ICD-10-CM | POA: Diagnosis not present

## 2017-11-02 DIAGNOSIS — G6289 Other specified polyneuropathies: Secondary | ICD-10-CM | POA: Diagnosis not present

## 2017-11-08 DIAGNOSIS — E11319 Type 2 diabetes mellitus with unspecified diabetic retinopathy without macular edema: Secondary | ICD-10-CM | POA: Diagnosis not present

## 2017-11-09 DIAGNOSIS — E1142 Type 2 diabetes mellitus with diabetic polyneuropathy: Secondary | ICD-10-CM | POA: Diagnosis not present

## 2017-11-09 DIAGNOSIS — E11621 Type 2 diabetes mellitus with foot ulcer: Secondary | ICD-10-CM | POA: Diagnosis not present

## 2017-11-09 DIAGNOSIS — L98491 Non-pressure chronic ulcer of skin of other sites limited to breakdown of skin: Secondary | ICD-10-CM | POA: Diagnosis not present

## 2017-11-30 ENCOUNTER — Other Ambulatory Visit (HOSPITAL_COMMUNITY): Payer: Self-pay | Admitting: Internal Medicine

## 2017-11-30 DIAGNOSIS — L97929 Non-pressure chronic ulcer of unspecified part of left lower leg with unspecified severity: Secondary | ICD-10-CM

## 2017-12-01 ENCOUNTER — Ambulatory Visit (HOSPITAL_COMMUNITY)
Admission: RE | Admit: 2017-12-01 | Discharge: 2017-12-01 | Disposition: A | Discharge status: Home / Self Care | Payer: Medicare Other | Source: Ambulatory Visit | Attending: Vascular Surgery | Admitting: Vascular Surgery

## 2017-12-01 DIAGNOSIS — L97929 Non-pressure chronic ulcer of unspecified part of left lower leg with unspecified severity: Secondary | ICD-10-CM | POA: Diagnosis not present

## 2017-12-01 DIAGNOSIS — E11621 Type 2 diabetes mellitus with foot ulcer: Secondary | ICD-10-CM | POA: Insufficient documentation

## 2017-12-01 LAB — VAS US LOWER EXTREMITY ARTERIAL DUPLEX
LATIBDISTSYS: -82 cm/s
LPERODISTSYS: -141 cm/s
LSFDPSV: -113 cm/s
LSFPPSV: 82 cm/s
RATIBDISTSYS: 33 cm/s
RIGHT POST TIB DIST SYS: 105 cm/s
RSFPPSV: 125 cm/s
Right peroneal sys PSV: 88 cm/s
Right super femoral dist sys PSV: -130 cm/s
Right super femoral mid sys PSV: -102 cm/s
left post tibial dist sys: 90 cm/s

## 2017-12-28 DIAGNOSIS — F3342 Major depressive disorder, recurrent, in full remission: Secondary | ICD-10-CM | POA: Diagnosis not present

## 2017-12-28 DIAGNOSIS — E11319 Type 2 diabetes mellitus with unspecified diabetic retinopathy without macular edema: Secondary | ICD-10-CM | POA: Diagnosis not present

## 2018-01-02 DIAGNOSIS — E1142 Type 2 diabetes mellitus with diabetic polyneuropathy: Secondary | ICD-10-CM | POA: Diagnosis not present

## 2018-01-02 DIAGNOSIS — L98491 Non-pressure chronic ulcer of skin of other sites limited to breakdown of skin: Secondary | ICD-10-CM | POA: Diagnosis not present

## 2018-01-04 DIAGNOSIS — G20 Parkinson's disease: Secondary | ICD-10-CM | POA: Diagnosis not present

## 2018-01-04 DIAGNOSIS — Z9689 Presence of other specified functional implants: Secondary | ICD-10-CM | POA: Diagnosis not present

## 2018-01-31 DIAGNOSIS — E7800 Pure hypercholesterolemia, unspecified: Secondary | ICD-10-CM | POA: Diagnosis not present

## 2018-01-31 DIAGNOSIS — E11319 Type 2 diabetes mellitus with unspecified diabetic retinopathy without macular edema: Secondary | ICD-10-CM | POA: Diagnosis not present

## 2018-01-31 DIAGNOSIS — F331 Major depressive disorder, recurrent, moderate: Secondary | ICD-10-CM | POA: Diagnosis not present

## 2018-01-31 DIAGNOSIS — G20 Parkinson's disease: Secondary | ICD-10-CM | POA: Diagnosis not present

## 2018-02-08 DIAGNOSIS — Z794 Long term (current) use of insulin: Secondary | ICD-10-CM | POA: Diagnosis not present

## 2018-02-13 DIAGNOSIS — E11621 Type 2 diabetes mellitus with foot ulcer: Secondary | ICD-10-CM | POA: Diagnosis not present

## 2018-02-13 DIAGNOSIS — E1142 Type 2 diabetes mellitus with diabetic polyneuropathy: Secondary | ICD-10-CM | POA: Diagnosis not present

## 2018-02-13 DIAGNOSIS — L98491 Non-pressure chronic ulcer of skin of other sites limited to breakdown of skin: Secondary | ICD-10-CM | POA: Diagnosis not present

## 2018-02-21 DIAGNOSIS — H04123 Dry eye syndrome of bilateral lacrimal glands: Secondary | ICD-10-CM | POA: Diagnosis not present

## 2018-02-21 DIAGNOSIS — H1131 Conjunctival hemorrhage, right eye: Secondary | ICD-10-CM | POA: Diagnosis not present

## 2018-02-21 DIAGNOSIS — E113291 Type 2 diabetes mellitus with mild nonproliferative diabetic retinopathy without macular edema, right eye: Secondary | ICD-10-CM | POA: Diagnosis not present

## 2018-03-02 DIAGNOSIS — H524 Presbyopia: Secondary | ICD-10-CM | POA: Diagnosis not present

## 2018-03-02 DIAGNOSIS — E119 Type 2 diabetes mellitus without complications: Secondary | ICD-10-CM | POA: Diagnosis not present

## 2018-03-02 DIAGNOSIS — H04123 Dry eye syndrome of bilateral lacrimal glands: Secondary | ICD-10-CM | POA: Diagnosis not present

## 2018-03-02 DIAGNOSIS — H1131 Conjunctival hemorrhage, right eye: Secondary | ICD-10-CM | POA: Diagnosis not present

## 2018-03-27 DIAGNOSIS — L98491 Non-pressure chronic ulcer of skin of other sites limited to breakdown of skin: Secondary | ICD-10-CM | POA: Diagnosis not present

## 2018-03-27 DIAGNOSIS — B351 Tinea unguium: Secondary | ICD-10-CM | POA: Diagnosis not present

## 2018-03-27 DIAGNOSIS — E1142 Type 2 diabetes mellitus with diabetic polyneuropathy: Secondary | ICD-10-CM | POA: Diagnosis not present

## 2018-03-27 DIAGNOSIS — E11621 Type 2 diabetes mellitus with foot ulcer: Secondary | ICD-10-CM | POA: Diagnosis not present

## 2018-03-28 DIAGNOSIS — E11319 Type 2 diabetes mellitus with unspecified diabetic retinopathy without macular edema: Secondary | ICD-10-CM | POA: Diagnosis not present

## 2018-04-06 DIAGNOSIS — E119 Type 2 diabetes mellitus without complications: Secondary | ICD-10-CM | POA: Diagnosis not present

## 2018-05-05 DIAGNOSIS — E11621 Type 2 diabetes mellitus with foot ulcer: Secondary | ICD-10-CM | POA: Diagnosis not present

## 2018-05-05 DIAGNOSIS — E11319 Type 2 diabetes mellitus with unspecified diabetic retinopathy without macular edema: Secondary | ICD-10-CM | POA: Diagnosis not present

## 2018-05-05 DIAGNOSIS — E7800 Pure hypercholesterolemia, unspecified: Secondary | ICD-10-CM | POA: Diagnosis not present

## 2018-05-05 DIAGNOSIS — G6289 Other specified polyneuropathies: Secondary | ICD-10-CM | POA: Diagnosis not present

## 2018-05-22 DIAGNOSIS — E11319 Type 2 diabetes mellitus with unspecified diabetic retinopathy without macular edema: Secondary | ICD-10-CM | POA: Diagnosis not present

## 2018-05-29 DIAGNOSIS — B351 Tinea unguium: Secondary | ICD-10-CM | POA: Diagnosis not present

## 2018-05-29 DIAGNOSIS — E11621 Type 2 diabetes mellitus with foot ulcer: Secondary | ICD-10-CM | POA: Diagnosis not present

## 2018-05-29 DIAGNOSIS — L98491 Non-pressure chronic ulcer of skin of other sites limited to breakdown of skin: Secondary | ICD-10-CM | POA: Diagnosis not present

## 2018-05-29 DIAGNOSIS — E1142 Type 2 diabetes mellitus with diabetic polyneuropathy: Secondary | ICD-10-CM | POA: Diagnosis not present

## 2018-07-05 DIAGNOSIS — G20 Parkinson's disease: Secondary | ICD-10-CM | POA: Diagnosis not present

## 2018-07-05 DIAGNOSIS — R4189 Other symptoms and signs involving cognitive functions and awareness: Secondary | ICD-10-CM | POA: Diagnosis not present

## 2018-07-11 ENCOUNTER — Other Ambulatory Visit: Payer: Self-pay | Admitting: Gastroenterology

## 2018-07-11 NOTE — Telephone Encounter (Signed)
Incoming refill request for Creon. Lat seen 11-2016.

## 2018-07-11 NOTE — Telephone Encounter (Signed)
I refilled it for one month with one refill.  Please arrange an office visit with me for sometime in the next 2 months.  Needs OV with me if I am to continue prescribing this medicine.  Speak to his wife, since I believe the patient has memory difficulty.

## 2018-07-11 NOTE — Telephone Encounter (Signed)
Follow up scheduled with Mr Bookers wife Ginger.

## 2018-07-13 ENCOUNTER — Other Ambulatory Visit: Payer: Self-pay | Admitting: Gastroenterology

## 2018-07-13 DIAGNOSIS — G20 Parkinson's disease: Secondary | ICD-10-CM | POA: Diagnosis not present

## 2018-07-17 DIAGNOSIS — E1142 Type 2 diabetes mellitus with diabetic polyneuropathy: Secondary | ICD-10-CM | POA: Diagnosis not present

## 2018-07-17 DIAGNOSIS — L98491 Non-pressure chronic ulcer of skin of other sites limited to breakdown of skin: Secondary | ICD-10-CM | POA: Diagnosis not present

## 2018-07-31 DIAGNOSIS — R82998 Other abnormal findings in urine: Secondary | ICD-10-CM | POA: Diagnosis not present

## 2018-07-31 DIAGNOSIS — E7800 Pure hypercholesterolemia, unspecified: Secondary | ICD-10-CM | POA: Diagnosis not present

## 2018-07-31 DIAGNOSIS — Z125 Encounter for screening for malignant neoplasm of prostate: Secondary | ICD-10-CM | POA: Diagnosis not present

## 2018-07-31 DIAGNOSIS — E11319 Type 2 diabetes mellitus with unspecified diabetic retinopathy without macular edema: Secondary | ICD-10-CM | POA: Diagnosis not present

## 2018-08-03 ENCOUNTER — Ambulatory Visit: Payer: Medicare Other | Admitting: Gastroenterology

## 2018-08-03 DIAGNOSIS — Z23 Encounter for immunization: Secondary | ICD-10-CM | POA: Diagnosis not present

## 2018-08-03 DIAGNOSIS — N183 Chronic kidney disease, stage 3 (moderate): Secondary | ICD-10-CM | POA: Diagnosis not present

## 2018-08-03 DIAGNOSIS — E1129 Type 2 diabetes mellitus with other diabetic kidney complication: Secondary | ICD-10-CM | POA: Diagnosis not present

## 2018-08-03 DIAGNOSIS — E1140 Type 2 diabetes mellitus with diabetic neuropathy, unspecified: Secondary | ICD-10-CM | POA: Diagnosis not present

## 2018-08-03 DIAGNOSIS — Z0000 Encounter for general adult medical examination without abnormal findings: Secondary | ICD-10-CM | POA: Diagnosis not present

## 2018-08-04 DIAGNOSIS — N3941 Urge incontinence: Secondary | ICD-10-CM | POA: Diagnosis not present

## 2018-08-04 DIAGNOSIS — G20 Parkinson's disease: Secondary | ICD-10-CM | POA: Diagnosis not present

## 2018-08-04 DIAGNOSIS — N401 Enlarged prostate with lower urinary tract symptoms: Secondary | ICD-10-CM | POA: Diagnosis not present

## 2018-08-04 DIAGNOSIS — N528 Other male erectile dysfunction: Secondary | ICD-10-CM | POA: Diagnosis not present

## 2018-08-09 DIAGNOSIS — L98491 Non-pressure chronic ulcer of skin of other sites limited to breakdown of skin: Secondary | ICD-10-CM | POA: Diagnosis not present

## 2018-08-15 ENCOUNTER — Encounter: Payer: Self-pay | Admitting: Gastroenterology

## 2018-08-15 ENCOUNTER — Ambulatory Visit: Payer: Medicare Other | Admitting: Gastroenterology

## 2018-08-15 VITALS — BP 134/62 | HR 68 | Ht 71.0 in | Wt 172.4 lb

## 2018-08-15 DIAGNOSIS — K529 Noninfective gastroenteritis and colitis, unspecified: Secondary | ICD-10-CM | POA: Diagnosis not present

## 2018-08-15 DIAGNOSIS — R197 Diarrhea, unspecified: Secondary | ICD-10-CM

## 2018-08-15 DIAGNOSIS — K8689 Other specified diseases of pancreas: Secondary | ICD-10-CM

## 2018-08-15 DIAGNOSIS — E119 Type 2 diabetes mellitus without complications: Secondary | ICD-10-CM | POA: Diagnosis not present

## 2018-08-15 DIAGNOSIS — K909 Intestinal malabsorption, unspecified: Secondary | ICD-10-CM | POA: Diagnosis not present

## 2018-08-15 NOTE — Progress Notes (Signed)
[facility] Progress Note  Chief Complaint: Pancreatic insufficiency and chronic diarrhea  Subjective  History:  Randall [name] was here to see me today for his absorption from pancreatic insufficiency.  His last visit was in December 2017, and he is accompanied by his wife Randall [name] today.  It sounds like he has been doing relatively well overall from the standpoint of diarrhea.  There is some days where he has urgency and loose stool that might be oily.  It also sounds like he does not always take his pancreatic enzymes as directed, especially if he has a snack, which might consist of a beer, some crackers and pimento cheese.  Randall [name] says his weight is been stable and he generally has a good appetite. Randall [name] denies abdominal pain or rectal bleeding. His functional status is reasonably good. ROS: Cardiovascular:  no chest pain Respiratory: no dyspnea  The patient's Past Medical, Family and Social History were reviewed and are on file in the EMR.  Objective:  Med list reviewed  Current Outpatient Medications:  .  Carbidopa-Levodopa ER (SINEMET CR) 25-100 MG tablet controlled release, Take 1.5 tablets by mouth 3 (three) times daily., Disp: , Rfl:  .  CREON 36000 units CPEP capsule, TAKE 2 CAPSULES WITH EACH MEAL AND 1 CAPSULE WITH SNACKS., Disp: 600 capsule, Rfl: 0 .  donepezil (ARICEPT) 5 MG tablet, Take 5 mg by mouth at bedtime., Disp: , Rfl:  .  fesoterodine (TOVIAZ) 8 MG TB24 tablet, Take 8 mg by mouth daily., Disp: , Rfl:  .  furosemide (LASIX) 20 MG tablet, Take 20 mg by mouth daily. For edema/HTN, Disp: , Rfl:  .  insulin glargine (LANTUS) 100 unit/mL SOPN, Inject 30 Units into the skin 2 (two) times daily., Disp: , Rfl:  .  insulin lispro (HUMALOG) 100 UNIT/ML injection, Inject 5 Units into the skin as needed for high blood sugar., Disp: , Rfl:  .  Pancrelipase, Lip-Prot-Amyl, (ZENPEP) 40000 units CPEP, 2 with meals and one with snacks., Disp: 200 capsule, Rfl: 0 .  Probiotic Product  (ALIGN) 4 MG CAPS, Take 1 capsule by mouth daily., Disp: , Rfl:  .  rOPINIRole (REQUIP XL) 8 MG 24 hr tablet, Take 8 mg by mouth every morning. Treats Parkinson's disease, Disp: , Rfl:  .  simvastatin (ZOCOR) 40 MG tablet, Take 40 mg by mouth daily. Lowers cholesterol, Disp: , Rfl:  .  venlafaxine XR (EFFEXOR XR) 75 MG 24 hr capsule, Take 225 mg by mouth daily with breakfast. , Disp: , Rfl:   Current Facility-Administered Medications:  .  dextrose 5 % solution, , Intravenous, Continuous, Danis, Yuan Gann L III, MD .  dextrose 50 % solution 25 mL, 25 mL, Intravenous, Once, Danis, Starr Lake III, MD   Vital signs in last 24 hrs: Vitals:   08/15/18 1608  BP: 134/62  Pulse: 68    Physical Exam  Pleasant elderly man, restricted affect/facies as before and resting tremor hands  HEENT: sclera anicteric, oral mucosa moist without lesions  Neck: supple, no thyromegaly, JVD or lymphadenopathy  Cardiac: RRR without murmurs, S1S2 heard, mild peripheral edema  Pulm: clear to auscultation bilaterally, normal RR and effort noted  Abdomen: soft, no tenderness, with active bowel sounds. No guarding or palpable hepatosplenomegaly.  Skin; warm and dry, no jaundice or rash    @ASSESSMENTPLANBEGIN @ Assessment: Encounter Diagnoses  Name Primary?  . Pancreatic insufficiency Yes  . Diarrhea due to malabsorption   . 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  He is doing well overall on current therapy, and I have encouraged him to be consistent taking the enzyme supplements, even if it is just 1 capsule with a snack. He inquired again about a colonoscopy, but seemed to have forgotten the difficulty he had prepping for the last time, with severe hypoglycemia and weakness.  At that time, we collectively decided not to plan a colonoscopy, especially seeing as though his diarrhea got so much better on the pancreatic enzymes.  He will see me in a year or sooner as  needed.   Total time 20 minutes, over half spent face-to-face with patient in counseling and coordination of care.   [name]

## 2018-08-15 NOTE — Patient Instructions (Addendum)
If you are age 78 or older, your body mass index should be between 23-30. Your Body mass index is 24.04 kg/m. If this is out of the aforementioned range listed, please consider follow up with your Primary Care Provider.  If you are age 42 or younger, your body mass index should be between 19-25. Your Body mass index is 24.04 kg/m. If this is out of the aformentioned range listed, please consider follow up with your Primary Care Provider.   Follow up in 1 yr.   It was a pleasure to see you today!  Dr. Myrtie Neither

## 2018-09-20 DIAGNOSIS — L98491 Non-pressure chronic ulcer of skin of other sites limited to breakdown of skin: Secondary | ICD-10-CM | POA: Diagnosis not present

## 2018-09-20 DIAGNOSIS — M2042 Other hammer toe(s) (acquired), left foot: Secondary | ICD-10-CM | POA: Diagnosis not present

## 2018-11-15 DIAGNOSIS — E119 Type 2 diabetes mellitus without complications: Secondary | ICD-10-CM | POA: Diagnosis not present

## 2018-11-15 DIAGNOSIS — B351 Tinea unguium: Secondary | ICD-10-CM | POA: Diagnosis not present

## 2018-11-16 DIAGNOSIS — E1129 Type 2 diabetes mellitus with other diabetic kidney complication: Secondary | ICD-10-CM | POA: Diagnosis not present

## 2018-11-16 DIAGNOSIS — N183 Chronic kidney disease, stage 3 (moderate): Secondary | ICD-10-CM | POA: Diagnosis not present

## 2018-11-16 DIAGNOSIS — I1310 Hypertensive heart and chronic kidney disease without heart failure, with stage 1 through stage 4 chronic kidney disease, or unspecified chronic kidney disease: Secondary | ICD-10-CM | POA: Diagnosis not present

## 2018-11-16 DIAGNOSIS — R808 Other proteinuria: Secondary | ICD-10-CM | POA: Diagnosis not present

## 2018-11-28 DIAGNOSIS — I1310 Hypertensive heart and chronic kidney disease without heart failure, with stage 1 through stage 4 chronic kidney disease, or unspecified chronic kidney disease: Secondary | ICD-10-CM | POA: Diagnosis not present

## 2018-11-28 DIAGNOSIS — E11621 Type 2 diabetes mellitus with foot ulcer: Secondary | ICD-10-CM | POA: Diagnosis not present

## 2018-11-28 DIAGNOSIS — E1140 Type 2 diabetes mellitus with diabetic neuropathy, unspecified: Secondary | ICD-10-CM | POA: Diagnosis not present

## 2018-11-28 DIAGNOSIS — L0390 Cellulitis, unspecified: Secondary | ICD-10-CM | POA: Diagnosis not present

## 2018-12-13 DIAGNOSIS — Z0120 Encounter for dental examination and cleaning without abnormal findings: Secondary | ICD-10-CM | POA: Diagnosis not present

## 2018-12-27 DIAGNOSIS — M2042 Other hammer toe(s) (acquired), left foot: Secondary | ICD-10-CM | POA: Diagnosis not present

## 2018-12-27 DIAGNOSIS — L98491 Non-pressure chronic ulcer of skin of other sites limited to breakdown of skin: Secondary | ICD-10-CM | POA: Diagnosis not present

## 2019-01-01 DIAGNOSIS — M2042 Other hammer toe(s) (acquired), left foot: Secondary | ICD-10-CM | POA: Diagnosis not present

## 2019-01-01 DIAGNOSIS — L97521 Non-pressure chronic ulcer of other part of left foot limited to breakdown of skin: Secondary | ICD-10-CM | POA: Diagnosis not present

## 2019-01-01 DIAGNOSIS — L03032 Cellulitis of left toe: Secondary | ICD-10-CM | POA: Diagnosis not present

## 2019-01-01 DIAGNOSIS — L98491 Non-pressure chronic ulcer of skin of other sites limited to breakdown of skin: Secondary | ICD-10-CM | POA: Diagnosis not present

## 2019-01-03 DIAGNOSIS — L97521 Non-pressure chronic ulcer of other part of left foot limited to breakdown of skin: Secondary | ICD-10-CM | POA: Diagnosis not present

## 2019-01-03 DIAGNOSIS — L03032 Cellulitis of left toe: Secondary | ICD-10-CM | POA: Diagnosis not present

## 2019-01-10 DIAGNOSIS — E11621 Type 2 diabetes mellitus with foot ulcer: Secondary | ICD-10-CM | POA: Diagnosis not present

## 2019-01-10 DIAGNOSIS — L98491 Non-pressure chronic ulcer of skin of other sites limited to breakdown of skin: Secondary | ICD-10-CM | POA: Diagnosis not present

## 2019-01-10 DIAGNOSIS — L03032 Cellulitis of left toe: Secondary | ICD-10-CM | POA: Diagnosis not present

## 2019-01-18 ENCOUNTER — Other Ambulatory Visit: Payer: Self-pay | Admitting: Gastroenterology

## 2019-01-25 DIAGNOSIS — R1012 Left upper quadrant pain: Secondary | ICD-10-CM | POA: Diagnosis not present

## 2019-01-25 DIAGNOSIS — W01198A Fall on same level from slipping, tripping and stumbling with subsequent striking against other object, initial encounter: Secondary | ICD-10-CM | POA: Diagnosis not present

## 2019-01-25 DIAGNOSIS — R0781 Pleurodynia: Secondary | ICD-10-CM | POA: Diagnosis not present

## 2019-01-25 DIAGNOSIS — E1129 Type 2 diabetes mellitus with other diabetic kidney complication: Secondary | ICD-10-CM | POA: Diagnosis not present

## 2019-02-05 DIAGNOSIS — L98491 Non-pressure chronic ulcer of skin of other sites limited to breakdown of skin: Secondary | ICD-10-CM | POA: Diagnosis not present

## 2019-02-05 DIAGNOSIS — E1140 Type 2 diabetes mellitus with diabetic neuropathy, unspecified: Secondary | ICD-10-CM | POA: Diagnosis not present

## 2019-02-07 DIAGNOSIS — Z91041 Radiographic dye allergy status: Secondary | ICD-10-CM | POA: Diagnosis not present

## 2019-02-07 DIAGNOSIS — Z9682 Presence of neurostimulator: Secondary | ICD-10-CM | POA: Diagnosis not present

## 2019-02-07 DIAGNOSIS — G20 Parkinson's disease: Secondary | ICD-10-CM | POA: Diagnosis not present

## 2019-02-19 ENCOUNTER — Ambulatory Visit: Payer: Medicare Other | Admitting: Gastroenterology

## 2019-02-23 ENCOUNTER — Telehealth: Payer: Self-pay | Admitting: Gastroenterology

## 2019-02-23 NOTE — Telephone Encounter (Signed)
Noted. Dr. Myrtie Neither notified.

## 2019-02-23 NOTE — Telephone Encounter (Signed)
Due to COVID per Patient request would like a phone visit.  OV not yet Cancelled on 3-25.20

## 2019-02-26 ENCOUNTER — Ambulatory Visit: Payer: Medicare Other | Admitting: Physical Therapy

## 2019-02-28 ENCOUNTER — Other Ambulatory Visit: Payer: Self-pay

## 2019-02-28 ENCOUNTER — Telehealth (INDEPENDENT_AMBULATORY_CARE_PROVIDER_SITE_OTHER): Payer: Medicare Other | Admitting: Gastroenterology

## 2019-02-28 DIAGNOSIS — R634 Abnormal weight loss: Secondary | ICD-10-CM

## 2019-02-28 DIAGNOSIS — K8689 Other specified diseases of pancreas: Secondary | ICD-10-CM

## 2019-02-28 DIAGNOSIS — K529 Noninfective gastroenteritis and colitis, unspecified: Secondary | ICD-10-CM

## 2019-02-28 NOTE — Progress Notes (Signed)
This patient contacted our office requesting a physician telemedicine video consultation regarding clinical questions and/or test results.   Participants on the phone call myself, Randall [name] and his wife Randall [name]   The patient consented to phone consultation and was aware that a charge will be placed through their insurance.  I was in my office and the patient was at home on the phone   Total encounter time:  18 minutes   Randall Jupiter, MD   __________________________________________________________           [name] Progress Note  Chief Complaint: exocrine pancreatic insufficiency  Subjective  History: Last seen in offce 08/15/18 - 172# Randall [name] has lost about 10 pounds since then occ'l stomach pain - vague occ'l diarrhea  Had fall recently  No jaundice noted by wife Randall [name] (Randall [name] has memory trouble)  They feel glucose control has not been optimal  - plan to contact primary care.  ROS: Cardiovascular:  no chest pain Respiratory: no dyspnea  The patient's Past Medical, Family and Social History were reviewed and are on file in the EMR.  Objective:  Med list reviewed  Current Outpatient Medications:  .  Carbidopa-Levodopa ER (SINEMET CR) 25-100 MG tablet controlled release, Take 1.5 tablets by mouth 3 (three) times daily., Disp: , Rfl:  .  CREON 36000 units CPEP capsule, TAKE 2 CAPSULES WITH EACH MEAL AND 1 CAPSULE WITH SNACKS., Disp: 270 capsule, Rfl: 6 .  donepezil (ARICEPT) 5 MG tablet, Take 5 mg by mouth at bedtime., Disp: , Rfl:  .  fesoterodine (TOVIAZ) 8 MG TB24 tablet, Take 8 mg by mouth daily., Disp: , Rfl:  .  furosemide (LASIX) 20 MG tablet, Take 20 mg by mouth daily. For edema/HTN, Disp: , Rfl:  .  insulin glargine (LANTUS) 100 unit/mL SOPN, Inject 30 Units into the skin 2 (two) times daily., Disp: , Rfl:  .  insulin lispro (HUMALOG) 100 UNIT/ML injection, Inject 5 Units into the skin as needed for high blood sugar., Disp: , Rfl:  .  Pancrelipase, Lip-Prot-Amyl,  (ZENPEP) 40000 units CPEP, 2 with meals and one with snacks., Disp: 200 capsule, Rfl: 0 .  Probiotic Product (ALIGN) 4 MG CAPS, Take 1 capsule by mouth daily., Disp: , Rfl:  .  rOPINIRole (REQUIP XL) 8 MG 24 hr tablet, Take 8 mg by mouth every morning. Treats Parkinson's disease, Disp: , Rfl:  .  simvastatin (ZOCOR) 40 MG tablet, Take 40 mg by mouth daily. Lowers cholesterol, Disp: , Rfl:  .  venlafaxine XR (EFFEXOR XR) 75 MG 24 hr capsule, Take 225 mg by mouth daily with breakfast. , Disp: , Rfl:   Current Facility-Administered Medications:  .  dextrose 5 % solution, , Intravenous, Continuous, Danis, Randall L III, MD .  dextrose 50 % solution 25 mL, 25 mL, Intravenous, Once, Danis, Randall Blower, MD    @ASSESSMENTPLANBEGIN @ Assessment: Encounter Diagnoses  Name Primary?  . Pancreatic insufficiency Yes  . Chronic diarrhea   . Loss of weight    Seems like creon dosing is still good.   Unclear why weight loss.  Randall [name] will monitor and let me know if continues.  Weigh every week.  Randall [name]

## 2019-03-20 DIAGNOSIS — E1140 Type 2 diabetes mellitus with diabetic neuropathy, unspecified: Secondary | ICD-10-CM | POA: Diagnosis not present

## 2019-03-20 DIAGNOSIS — R809 Proteinuria, unspecified: Secondary | ICD-10-CM | POA: Diagnosis not present

## 2019-03-20 DIAGNOSIS — E11319 Type 2 diabetes mellitus with unspecified diabetic retinopathy without macular edema: Secondary | ICD-10-CM | POA: Diagnosis not present

## 2019-03-20 DIAGNOSIS — E1129 Type 2 diabetes mellitus with other diabetic kidney complication: Secondary | ICD-10-CM | POA: Diagnosis not present

## 2019-03-28 ENCOUNTER — Encounter: Payer: Self-pay | Admitting: Gastroenterology

## 2019-03-28 DIAGNOSIS — M6281 Muscle weakness (generalized): Secondary | ICD-10-CM | POA: Diagnosis not present

## 2019-03-28 DIAGNOSIS — Z20818 Contact with and (suspected) exposure to other bacterial communicable diseases: Secondary | ICD-10-CM | POA: Diagnosis not present

## 2019-03-28 DIAGNOSIS — R05 Cough: Secondary | ICD-10-CM | POA: Diagnosis not present

## 2019-03-28 DIAGNOSIS — R634 Abnormal weight loss: Secondary | ICD-10-CM | POA: Diagnosis not present

## 2019-04-03 DIAGNOSIS — Z9682 Presence of neurostimulator: Secondary | ICD-10-CM | POA: Diagnosis not present

## 2019-04-03 DIAGNOSIS — Z79899 Other long term (current) drug therapy: Secondary | ICD-10-CM | POA: Diagnosis not present

## 2019-04-03 DIAGNOSIS — G20 Parkinson's disease: Secondary | ICD-10-CM | POA: Diagnosis not present

## 2019-04-03 DIAGNOSIS — G4700 Insomnia, unspecified: Secondary | ICD-10-CM | POA: Diagnosis not present

## 2019-04-16 DIAGNOSIS — Z20818 Contact with and (suspected) exposure to other bacterial communicable diseases: Secondary | ICD-10-CM | POA: Diagnosis not present

## 2019-04-16 DIAGNOSIS — I1310 Hypertensive heart and chronic kidney disease without heart failure, with stage 1 through stage 4 chronic kidney disease, or unspecified chronic kidney disease: Secondary | ICD-10-CM | POA: Diagnosis not present

## 2019-04-16 DIAGNOSIS — R05 Cough: Secondary | ICD-10-CM | POA: Diagnosis not present

## 2019-04-16 DIAGNOSIS — J069 Acute upper respiratory infection, unspecified: Secondary | ICD-10-CM | POA: Diagnosis not present

## 2019-05-07 DIAGNOSIS — L98491 Non-pressure chronic ulcer of skin of other sites limited to breakdown of skin: Secondary | ICD-10-CM | POA: Diagnosis not present

## 2019-05-14 ENCOUNTER — Ambulatory Visit: Payer: Medicare Other | Attending: Neurology | Admitting: Physical Therapy

## 2019-05-14 ENCOUNTER — Other Ambulatory Visit: Payer: Self-pay

## 2019-05-14 ENCOUNTER — Encounter: Payer: Self-pay | Admitting: Physical Therapy

## 2019-05-14 DIAGNOSIS — R293 Abnormal posture: Secondary | ICD-10-CM | POA: Diagnosis not present

## 2019-05-14 DIAGNOSIS — R2689 Other abnormalities of gait and mobility: Secondary | ICD-10-CM

## 2019-05-14 DIAGNOSIS — R2681 Unsteadiness on feet: Secondary | ICD-10-CM

## 2019-05-14 DIAGNOSIS — M6281 Muscle weakness (generalized): Secondary | ICD-10-CM

## 2019-05-14 DIAGNOSIS — R29818 Other symptoms and signs involving the nervous system: Secondary | ICD-10-CM | POA: Diagnosis not present

## 2019-05-14 NOTE — Patient Instructions (Addendum)
Sit to Stand Transfers:  1. Scoot out to the edge of the chair 2. Place your feet flat on the floor, shoulder width apart.  Make sure your feet are tucked just under your knees. 3. Lean forward (nose over toes) with momentum, and stand up tall with your best posture.  If you need to use your arms, use them as a quick boost up to stand. 4. If you are in a low or soft chair, you can lean back and then forward up to stand, in order to get more momentum. 5. Once you are standing, make sure you are looking ahead and standing tall.  To sit down:  1. Back up until you feel the chair behind your legs. 2. Bend at your hips, reaching  Back for you chair, if needed, then slowly squat to sit down on your chair.   Discussed premise of large, deliberate, high amplitude movement patterns to address smaller, slower movement patterns noted with PD.  Discussed postural changes, sensory/perceptual mismatch in people with Parkinson's.  In regards to hip pain:  Discussed smooth, deliberate transfers and slowed descent into sitting; potential use of ice packs/ice massage if needed for pain; benefits of getting up and moving more through the day to prevent stiffness/rigidity-related pain.

## 2019-05-15 NOTE — Therapy (Signed)
[facility] [address] [place], [place], [address] Phone: [phone]   Fax:  [phone]  Physical Therapy Evaluation  Patient Details  Name: Randall [name] MRN: [id number] Date of Birth: [birth year] Referring Provider (PT): Rubin PayorSiddiqui   Encounter Date: 05/14/2019  CLINIC OPERATION CHANGES: Outpatient Neuro Rehab is open at lower capacity following universal masking, social distancing, and patient screening.  The patient's COVID risk of complications score is 4.   PT End of Session - 05/15/19 1602    Visit Number  1    Number of Visits  14    Date for PT Re-Evaluation  07/14/19    Authorization Type  BCBS Medicare-will need 10th visit progress note-no visit limit (auth required)    PT Start Time  1700    PT Stop Time  1748    PT Time Calculation (min)  48 min    Equipment Utilized During Treatment  Gait belt    Activity Tolerance  Patient tolerated treatment well    Behavior During Therapy  WFL for tasks assessed/performed       Past Medical History:  Diagnosis Date  . Anxiety   . Arthritis   . Diabetes mellitus without complication (HCC)   . GERD (gastroesophageal reflux disease)   . Hyperlipidemia   . Incontinence of urine    DR  RON   DAVIS    LOV FEB. 2015  . Neuromuscular disorder (HCC)   . Parkinson disease (HCC)   . RSD (reflex sympathetic dystrophy)    2005    Past Surgical History:  Procedure Laterality Date  . APPENDECTOMY    . BRAIN SURGERY     deep brain stimulation for parkinsons  . CARDIAC CATHETERIZATION     2005  . COLON SURGERY     resection diverticulitis  . TOTAL KNEE ARTHROPLASTY Right 02/19/2014   Procedure: TOTAL KNEE ARTHROPLASTY;  Surgeon: Velna OchsPeter G Dalldorf, MD;  Location: [facility] OR;  Service: Orthopedics;  Laterality: Right;    There were no vitals filed for this visit.   Subjective Assessment - 05/14/19 1707    Subjective  Would like help with balance.  Wife reports having a  lot of pain in L hip (has been going on about 2 months).  Has had about 4 falls in the past 6 months.  One fall was tripping over something in the parking lot.  Usually uses walker (does not have at eval).  Has history of DBS in 2016 for L side.  Balance is just not good.  Need help getting up from a fall.    Patient is accompained by:  Family member   wife   Pertinent History  parkinson's disease with DBS (deep brain stimulator) placement R (04/2015), OA, hx of R TKR, depression    Patient Stated Goals  Pt's goals for therapy are to improve balance, prevent falls, to help pain and "tilt."    Currently in Pain?  Yes    Pain Score  6     Pain Location  Hip    Pain Orientation  Left    Pain Descriptors / Indicators  Sharp    Pain Type  Acute pain    Pain Onset  More than a month ago    Pain Frequency  Intermittent    Aggravating Factors   certain movements    Pain Relieving Factors  lying down and the pain patch         [facility] PT Assessment - 05/14/19  1716      Assessment   Medical Diagnosis  Parkinson's disease    Referring Provider (PT)  Siddiqui    Onset Date/Surgical Date  02/14/19    Hand Dominance  Right      Precautions   Precautions  Fall      Balance Screen   Has the patient fallen in the past 6 months  Yes    How many times?  4    Has the patient had a decrease in activity level because of a fear of falling?   Yes   and pain   Is the patient reluctant to leave their home because of a fear of falling?   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      ROM / Strength   AROM / PROM / Strength  Strength      Strength   Overall Strength  Deficits    Overall Strength Comments  Grossly tested 4/5 hip flexion RLE throughout, 3+/5 L hip flexion, quads, hamstrings; 4/5 L dorsiflexion, 3+/5 R dorsiflexion   No hip pain reported with resisted MMT     Palpation   Palpation comment  Pt tender to palpation posterior to L hip, more lateral and posterior to SI joint.  With deep palpation near piriformis area L buttock, pt reports pain.  No pain with palpation along greater trochanter or IT band.      Transfers   Transfers  Sit to Stand;Stand to Sit    Sit to Stand  5: Supervision;With upper extremity assist;With armrests;From chair/3-in-1    Five time sit to stand comments   34.72    Stand to Sit  5: Supervision;With upper extremity assist;With armrests;To chair/3-in-1      Ambulation/Gait   Ambulation/Gait  Yes    Ambulation/Gait Assistance  5: Supervision;4: Min guard    Ambulation/Gait Assistance Details  Pt uses no device in therapy eval; trialed RW for one measure of 80M walk test    Ambulation Distance (Feet)  60 Feet   x 2   Assistive device  None;Rolling walker    Gait Pattern  Step-through pattern;Decreased arm swing - right;Decreased arm swing - left;Decreased step length - right;Decreased step length - left;Decreased stride length;Decreased dorsiflexion -  right;Decreased dorsiflexion - left;Right flexed knee in stance;Left flexed knee in stance;Shuffle;Lateral trunk lean  to left;Decreased trunk rotation;Trunk flexed;Narrow base of support;Poor foot clearance - left;Poor foot clearance - right    Ambulation Surface  Level;Indoor    Gait velocity  19 sec with no device = 1.73 ft/sec; with RW, 20.85 sec :  1.57 ft/sec      Standardized Balance Assessment   Standardized Balance Assessment  Timed Up and Go Test      Timed Up and Go Test   Normal TUG (seconds)  29.12    Cognitive TUG (seconds)  38.83    TUG Comments  Scores >13.5-15 seconds indicate increased fall risk; scores>30 seconds indicate increased difficulty with ADLs in home; >10% difference indicates difficulty with dual tasking                Objective measurements completed on examination: See above findings.            Self Care:  See instructions   PT Education - 05/15/19 1601    Education Details  Sit<>stand technique, Large-amplitude movement pattern retraining in people with PD; tips to reduce hip pain:  smooth, controlled sit<>stand, icing if needed, increase walking throughout the day    Person(s) Educated  Patient;Spouse    Methods  Explanation;Demonstration;Handout    Comprehension  Verbalized understanding;Returned demonstration;Verbal cues required       PT Short Term Goals - 05/15/19 1613      PT SHORT TERM GOAL #1   Title  Pt will perform HEP with wife's supervision to address PD-specific deficits to improve balance, transfers, gait.  TARGET 06/15/2019    Time  4    Period  Weeks    Status  New    Target Date  06/15/19      PT SHORT TERM GOAL #2   Title  Pt will improve 5x sit<>stand to less than or equal to 25 seconds for improved transfer efficiency and safety.    Time  4    Period  Weeks    Status  New    Target Date  06/15/19      PT SHORT TERM GOAL #3   Title  Pt will improve TUG score to less than or equal to 22 seconds for  decreased fall risk.    Time  4    Period  Weeks    Status  New    Target Date  06/15/19      PT SHORT TERM GOAL #4   Title  Pt will ambulate at least 200 ft using least restrictive assistive device with supervision, for improved safety and efficiency with gait.    Baseline       Time  4    Period  Weeks    Status  New    Target Date  06/15/19      PT SHORT TERM GOAL #5   Title  Pt will verbalize at least 3 means to reduce L hip pain for improved overall functional mobility.    Time  4    Period  Weeks    Status  New    Target Date  06/15/19        PT Long Term Goals - 05/15/19 1617      PT LONG TERM GOAL #1   Title  Pt will verbalize plans for continued community fitness upon d/c from PT.  TARGET 07/06/2019    Time  7    Period  Weeks    Status  New    Target Date  07/06/19  PT LONG TERM GOAL #2   Title  Pt will improve 5x sit<>stand to less than or equal to 18 seconds for improved transfer efficiency and safety.    Time  7    Period  Weeks    Status  New    Target Date  07/06/19      PT LONG TERM GOAL #3   Title  Pt will improve TUG and TUG cognitive score to less than 10% difference, for improved dual tasking/decreased fall risk with gait.    Time  7    Period  Weeks    Status  New    Target Date  07/06/19      PT LONG TERM GOAL #4   Title  Pt will improve gait velocity to at least 2 ft/sec for decreased fall risk, improved gait efficiency and safety.    Time  7    Period  Weeks    Status  New    Target Date  07/06/19      PT LONG TERM GOAL #5   Title  Pt/family will verbalize understanding of fall prevention in home environment.    Time  7    Period  Weeks    Status  New    Target Date  07/06/19             Plan - 05/15/19 1605    Clinical Impression Statement  Pt is a 78 year old male who presents to OPPT with history of Parkinson's disease and R sided deep brain stimulation surgery (2016) with recent history of falls and L hip pain.  He  presents with bradykinesia, abnormal posture, postural instability, rigidity, decreased strength, decreased timing and coordination of gait, L hip pain following fall (per wife-unsure if x-rays were done).  He has had 4 falls in past 6 months, and he is at fall risk per TUG and gait velocity scores.  Pt enjoys technological activities and does not currently have an exercise program.  He would benefit from skilled PT to address the above stated deficits to decrease fall risk and improve functional mobility.    Personal Factors and Comorbidities  Comorbidity 3+    Comorbidities  parkinson's disease with DBS (deep brain stimulator) placement R (04/2015), OA, hx of R TKR, depression    Examination-Activity Limitations  Locomotion Level;Stand;Sit    Stability/Clinical Decision Making  Evolving/Moderate complexity    Clinical Decision Making  Moderate    Rehab Potential  Good    PT Frequency  2x / week    PT Duration  6 weeks   plus eval, so total POC = 7 weeks   PT Treatment/Interventions  ADLs/Self Care Home Management;Therapeutic exercise;Therapeutic activities;Functional mobility training;Gait training;DME Instruction;Balance training;Neuromuscular re-education;Patient/family education    PT Next Visit Plan  Review of sit to stand; initiate PWR! Moves in sitting, standing, hamstring stretching, gait training activities to address foot clearance, step length, heelstrike    Consulted and Agree with Plan of Care  Patient;Family member/caregiver    Family Member Consulted  wife       Patient will benefit from skilled therapeutic intervention in order to improve the following deficits and impairments:  Abnormal gait, Decreased mobility, Decreased balance, Decreased coordination, Decreased strength, Difficulty walking, Postural dysfunction, Pain  Visit Diagnosis: Other abnormalities of gait and mobility  Unsteadiness on feet  Abnormal posture  Muscle weakness (generalized)  Other symptoms and  signs involving the nervous system     Problem List Patient Active Problem List  Diagnosis Date Noted  . Wound infection after surgery 03/09/2014  . 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 Hyperlipidemia   . Incontinence of urine   . Parkinson disease (HCC) 02/21/2014  . 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 Diabetes (HCC) 02/19/2014  . 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 05/15/2019, 4:20 PM  Lonia BloodAmy Mandel Seiden, PT 05/15/19 4:21 PM Phone: [phone] Fax: [phone]   [facility] [address] [place], [place], [address] Phone: [phone]   Fax:  [phone]  Name: Randall [name] MRN: [id number] Date of Birth: October 26, 1941

## 2019-05-21 ENCOUNTER — Ambulatory Visit: Payer: Medicare Other | Admitting: Physical Therapy

## 2019-05-21 ENCOUNTER — Encounter: Payer: Self-pay | Admitting: Physical Therapy

## 2019-05-21 ENCOUNTER — Other Ambulatory Visit: Payer: Self-pay

## 2019-05-21 DIAGNOSIS — R2681 Unsteadiness on feet: Secondary | ICD-10-CM | POA: Diagnosis not present

## 2019-05-21 DIAGNOSIS — R293 Abnormal posture: Secondary | ICD-10-CM | POA: Diagnosis not present

## 2019-05-21 DIAGNOSIS — R2689 Other abnormalities of gait and mobility: Secondary | ICD-10-CM | POA: Diagnosis not present

## 2019-05-21 DIAGNOSIS — R29818 Other symptoms and signs involving the nervous system: Secondary | ICD-10-CM | POA: Diagnosis not present

## 2019-05-21 DIAGNOSIS — M6281 Muscle weakness (generalized): Secondary | ICD-10-CM | POA: Diagnosis not present

## 2019-05-21 NOTE — Therapy (Signed)
[facility] [address] [place], [place], [address] Phone: [phone]   Fax:  [phone]  Physical Therapy Treatment  Patient Details  Name: Randall [name] MRN: [id number] Date of Birth: 08/21/1941 Referring Provider (PT): Rubin PayorSiddiqui   Encounter Date: 05/21/2019  PT End of Session - 05/21/19 1914    Visit Number  2    Number of Visits  14    Date for PT Re-Evaluation  07/14/19    Authorization Type  BCBS Medicare-will need 10th visit progress note-no visit limit (auth required)    PT Start Time  1700    PT Stop Time  1752    PT Time Calculation (min)  52 min    Equipment Utilized During Treatment  Gait belt    Activity Tolerance  Patient tolerated treatment well    Behavior During Therapy  [facility] HospitalWFL for tasks assessed/performed       Past Medical History:  Diagnosis Date  . Anxiety   . Arthritis   . Diabetes mellitus without complication (HCC)   . GERD (gastroesophageal reflux disease)   . Hyperlipidemia   . Incontinence of urine    DR  RON   DAVIS    LOV FEB. 2015  . Neuromuscular disorder (HCC)   . Parkinson disease (HCC)   . RSD (reflex sympathetic dystrophy)    2005    Past Surgical History:  Procedure Laterality Date  . APPENDECTOMY    . BRAIN SURGERY     deep brain stimulation for parkinsons  . CARDIAC CATHETERIZATION     2005  . COLON SURGERY     resection diverticulitis  . TOTAL KNEE ARTHROPLASTY Right 02/19/2014   Procedure: TOTAL KNEE ARTHROPLASTY;  Surgeon: Velna OchsPeter G Dalldorf, MD;  Location: [facility] OR;  Service: Orthopedics;  Laterality: Right;    There were no vitals filed for this visit.  Subjective Assessment - 05/21/19 1702    Subjective  [name] states that pt had orthopedic appointment for L hip pain on 6/19.  Pt states that ice has been helping pain and wife helps him with that x2/day. Pt states he has walker with him anytime he is out of his bedroom but didn't remember to bring it today.     Patient is accompained by:  Family member   wife   Pertinent History  parkinson's disease with DBS (deep brain stimulator) placement R (04/2015), OA, hx of R TKR, depression    Patient Stated Goals  Pt's goals for therapy are to improve balance, prevent falls, to help pain and "tilt."    Currently in Pain?  Yes    Pain Score  4     Pain Location  Hip    Pain Orientation  Left    Pain Descriptors / Indicators  Penetrating;Aching    Pain Onset  More than a month ago    Pain Frequency  Constant    Pain Relieving Factors  ice helps                       [facility] Adult PT Treatment/Exercise - 05/21/19 0001      Transfers   Transfers  Sit to Stand;Stand to Sit    Sit to Stand  5: Supervision;With upper extremity assist;With armrests;From chair/3-in-1    Five time sit to stand comments   cues for PWR! UP type movement, widening BOS; pt had decreased tolerance to multiple reps reporting increase L hip pain.  Stand to Sit  5: Supervision;With upper extremity assist;With armrests;To chair/3-in-1      Ambulation/Gait   Ambulation/Gait  Yes    Ambulation/Gait Assistance  5: Supervision    Ambulation/Gait Assistance Details  instruction on increase steplength and for armswing    Ambulation Distance (Feet)  200 Feet    Assistive device  None    Gait Pattern  Step-through pattern;Decreased arm swing - right;Decreased arm swing - left;Decreased step length - right;Decreased step length - left;Decreased stride length;Decreased dorsiflexion - right;Decreased dorsiflexion - left;Right flexed knee in stance;Left flexed knee in stance;Shuffle;Lateral trunk lean to left;Decreased trunk rotation;Trunk flexed;Narrow base of support;Poor foot clearance - left;Poor foot clearance - right    Ambulation Surface  Level;Indoor      Exercises   Exercises  Knee/Hip      Knee/Hip Exercises: Stretches   Active Hamstring Stretch  Both;3 reps;30 seconds      Knee/Hip  Exercises: Aerobic   Nustep  Level 1, UE and LE ,seat 12 to 13, handles 14 with pillow behind back              PT Education - 05/21/19 1913    Education Details  Issued walking program and seated hamstring stretch. Recommend pt bring RW next visit.   Person(s) Educated  Patient;Spouse    Methods  Explanation;Demonstration;Tactile cues;Verbal cues;Handout    Comprehension  Verbalized understanding;Returned demonstration;Verbal cues required;Need further instruction       PT Short Term Goals - 05/15/19 1613      PT SHORT TERM GOAL #1   Title  Pt will perform HEP with wife's supervision to address PD-specific deficits to improve balance, transfers, gait.  TARGET 06/15/2019    Time  4    Period  Weeks    Status  New    Target Date  06/15/19      PT SHORT TERM GOAL #2   Title  Pt will improve 5x sit<>stand to less than or equal to 25 seconds for improved transfer efficiency and safety.    Time  4    Period  Weeks    Status  New    Target Date  06/15/19      PT SHORT TERM GOAL #3   Title  Pt will improve TUG score to less than or equal to 22 seconds for decreased fall risk.    Time  4    Period  Weeks    Status  New    Target Date  06/15/19      PT SHORT TERM GOAL #4   Title  Pt will ambulate at least 200 ft using least restrictive assistive device with supervision, for improved safety and efficiency with gait.    Baseline       Time  4    Period  Weeks    Status  New    Target Date  06/15/19      PT SHORT TERM GOAL #5   Title  Pt will verbalize at least 3 means to reduce L hip pain for improved overall functional mobility.    Time  4    Period  Weeks    Status  New    Target Date  06/15/19        PT Long Term Goals - 05/15/19 1617      PT LONG TERM GOAL #1   Title  Pt will verbalize plans for continued community fitness upon d/c from PT.  TARGET 07/06/2019    Time  7    Period  Weeks    Status  New    Target Date  07/06/19      PT LONG TERM GOAL #2    Title  Pt will improve 5x sit<>stand to less than or equal to 18 seconds for improved transfer efficiency and safety.    Time  7    Period  Weeks    Status  New    Target Date  07/06/19      PT LONG TERM GOAL #3   Title  Pt will improve TUG and TUG cognitive score to less than 10% difference, for improved dual tasking/decreased fall risk with gait.    Time  7    Period  Weeks    Status  New    Target Date  07/06/19      PT LONG TERM GOAL #4   Title  Pt will improve gait velocity to at least 2 ft/sec for decreased fall risk, improved gait efficiency and safety.    Time  7    Period  Weeks    Status  New    Target Date  07/06/19      PT LONG TERM GOAL #5   Title  Pt/family will verbalize understanding of fall prevention in home environment.    Time  7    Period  Weeks    Status  New    Target Date  07/06/19            Plan - 05/21/19 1714    Clinical Impression Statement  Skilled session focused on LE warmup on nustep and hamstring stretch, gait mechanics and transfer mechanics.  Educated and issued HEP for walking program and LE stretching; pt's wife present for education for pt assistance at home.  Gait training and nustep did not aggravate L hip per pt; but pt reported some increase pain with transfer sit<>stand training.    Personal Factors and Comorbidities  Comorbidity 3+    Comorbidities  parkinson's disease with DBS (deep brain stimulator) placement R (04/2015), OA, hx of R TKR, depression    Examination-Activity Limitations  Locomotion Level;Stand;Sit    Stability/Clinical Decision Making  Evolving/Moderate complexity    Rehab Potential  Good    PT Frequency  2x / week    PT Duration  6 weeks   plus eval, so total POC = 7 weeks   PT Treatment/Interventions  ADLs/Self Care Home Management;Therapeutic exercise;Therapeutic activities;Functional mobility training;Gait training;DME Instruction;Balance training;Neuromuscular re-education;Patient/family education    PT  Next Visit Plan  Review of sit to stand; initiate PWR! Moves in sitting, standing,check hamstring stretching, check walking program; gait training activities to address foot clearance, step length, heelstrike    Consulted and Agree with Plan of Care  Patient;Family member/caregiver    Family Member Consulted  wife       Patient will benefit from skilled therapeutic intervention in order to improve the following deficits and impairments:  Abnormal gait, Decreased mobility, Decreased balance, Decreased coordination, Decreased strength, Difficulty walking, Postural dysfunction, Pain  Visit Diagnosis: 1. Unsteadiness on feet   2. Other abnormalities of gait and mobility   3. Abnormal posture   4. Muscle weakness (generalized)        Problem List Patient Active Problem List   Diagnosis Date Noted  . Wound infection after surgery 03/09/2014  . 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 Hyperlipidemia   . Incontinence of urine   . Parkinson disease (HCC) 02/21/2014  . Right knee  DJD 02/19/2014  . Diabetes (HCC) 02/19/2014  . 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 [place], [place], [address] Phone: [phone]   Fax:  [phone]  Name: Randall [name] MRN: [id number] Date of Birth: [birth year]

## 2019-05-21 NOTE — Patient Instructions (Addendum)
Walking for Exercise:  USE timer, wear good foot wear  Walk 3 min then do hamstring stretch,  Perform 2-3x/day  ++++use WALKER when walking alone   +++Focus on : increasing steplength, armswing, posture  Access Code: [place]  URL: [email or web address]  Date: 05/21/2019  Prepared by: Bjorn Loser   Exercises Seated Hamstring Stretch - 2 reps - 2 sets - 30 hold - 1-2x daily - 7x weekly

## 2019-05-23 DIAGNOSIS — M533 Sacrococcygeal disorders, not elsewhere classified: Secondary | ICD-10-CM | POA: Diagnosis not present

## 2019-06-04 ENCOUNTER — Ambulatory Visit: Payer: Medicare Other | Admitting: Physical Therapy

## 2019-06-04 ENCOUNTER — Other Ambulatory Visit: Payer: Self-pay

## 2019-06-04 ENCOUNTER — Encounter: Payer: Self-pay | Admitting: Physical Therapy

## 2019-06-04 DIAGNOSIS — R293 Abnormal posture: Secondary | ICD-10-CM

## 2019-06-04 DIAGNOSIS — R2689 Other abnormalities of gait and mobility: Secondary | ICD-10-CM | POA: Diagnosis not present

## 2019-06-04 DIAGNOSIS — M6281 Muscle weakness (generalized): Secondary | ICD-10-CM

## 2019-06-04 DIAGNOSIS — R2681 Unsteadiness on feet: Secondary | ICD-10-CM

## 2019-06-04 DIAGNOSIS — R29818 Other symptoms and signs involving the nervous system: Secondary | ICD-10-CM | POA: Diagnosis not present

## 2019-06-04 NOTE — Therapy (Signed)
[facility] [address] [place], [place], [address] Phone: [phone]   Fax:  [phone]  Physical Therapy Treatment  Patient Details  Name: Randall [name] MRN: [id number] Date of Birth: [birth year] Referring Provider (PT): Rubin PayorSiddiqui   Encounter Date: 06/04/2019  PT End of Session - 06/04/19 1910    Visit Number  3    Number of Visits  14    Date for PT Re-Evaluation  07/14/19    Authorization Type  [employer] Medicare-will need 10th visit progress note-no visit limit (auth required)    PT Start Time  1700    PT Stop Time  1745    PT Time Calculation (min)  45 min    Equipment Utilized During Treatment  Gait belt    Activity Tolerance  Patient tolerated treatment well    Behavior During Therapy  [facility] for tasks assessed/performed       Past Medical History:  Diagnosis Date  . Anxiety   . Arthritis   . Diabetes mellitus without complication (HCC)   . GERD (gastroesophageal reflux disease)   . Hyperlipidemia   . Incontinence of urine    DR  RON   DAVIS    LOV FEB. 2015  . Neuromuscular disorder (HCC)   . Parkinson disease (HCC)   . RSD (reflex sympathetic dystrophy)    2005    Past Surgical History:  Procedure Laterality Date  . APPENDECTOMY    . BRAIN SURGERY     deep brain stimulation for parkinsons  . CARDIAC CATHETERIZATION     2005  . COLON SURGERY     resection diverticulitis  . TOTAL KNEE ARTHROPLASTY Right 02/19/2014   Procedure: TOTAL KNEE ARTHROPLASTY;  Surgeon: Velna OchsPeter G Dalldorf, MD;  Location: [facility] OR;  Service: Orthopedics;  Laterality: Right;    There were no vitals filed for this visit.  Subjective Assessment - 06/04/19 1705    Subjective  Pt has orthopedic appointment tomorrow. Pt and wife report pt is consistently walking for exercise and performing hamstring stretch per HEP.  Pt reports L hip feels better overall.  Pt did not come into therapy with walker.    Patient is accompained by:   Family member   wife   Pertinent History  parkinson's disease with DBS (deep brain stimulator) placement R (04/2015), OA, hx of R TKR, depression    Patient Stated Goals  Pt's goals for therapy are to improve balance, prevent falls, to help pain and "tilt."    Currently in Pain?  Yes    Pain Score  1     Pain Location  Hip    Pain Orientation  Left    Pain Descriptors / Indicators  Sharp   sharp when it comes.   Pain Type  Chronic pain    Pain Onset  More than a month ago    Pain Frequency  Intermittent                       [facility] Adult PT Treatment/Exercise - 06/04/19 0001      Knee/Hip Exercises: Stretches   Active Hamstring Stretch  Both;3 reps;30 seconds   pillow behind back     Knee/Hip Exercises: Aerobic   Nustep  Level 2, UE and LE ,seat 12, handles13, no pillow needed. 8min          Balance Exercises - 06/04/19 1921      Balance Exercises: Standing   Stepping Strategy  Lateral  Cues for body mechanics with turning to step in a small space   Turning  Right;Both;Left   in small spaces and sharp turns around obstacles.        PT Education - 06/04/19 1718    Education Details  Reveiw HEP for walking program and hamstring stretch.  Educated pt and wife on how to practiced turning in small spaces at home.    Person(s) Educated  Patient;Spouse    Methods  Explanation;Demonstration;Verbal cues;Handout    Comprehension  Verbalized understanding;Returned demonstration;Verbal cues required;Need further instruction       PT Short Term Goals - 05/15/19 1613      PT SHORT TERM GOAL #1   Title  Pt will perform HEP with wife's supervision to address PD-specific deficits to improve balance, transfers, gait.  TARGET 06/15/2019    Time  4    Period  Weeks    Status  New    Target Date  06/15/19      PT SHORT TERM GOAL #2   Title  Pt will improve 5x sit<>stand to less than or equal to 25 seconds for improved transfer efficiency and safety.    Time  4     Period  Weeks    Status  New    Target Date  06/15/19      PT SHORT TERM GOAL #3   Title  Pt will improve TUG score to less than or equal to 22 seconds for decreased fall risk.    Time  4    Period  Weeks    Status  New    Target Date  06/15/19      PT SHORT TERM GOAL #4   Title  Pt will ambulate at least 200 ft using least restrictive assistive device with supervision, for improved safety and efficiency with gait.    Baseline       Time  4    Period  Weeks    Status  New    Target Date  06/15/19      PT SHORT TERM GOAL #5   Title  Pt will verbalize at least 3 means to reduce L hip pain for improved overall functional mobility.    Time  4    Period  Weeks    Status  New    Target Date  06/15/19        PT Long Term Goals - 05/15/19 1617      PT LONG TERM GOAL #1   Title  Pt will verbalize plans for continued community fitness upon d/c from PT.  TARGET 07/06/2019    Time  7    Period  Weeks    Status  New    Target Date  07/06/19      PT LONG TERM GOAL #2   Title  Pt will improve 5x sit<>stand to less than or equal to 18 seconds for improved transfer efficiency and safety.    Time  7    Period  Weeks    Status  New    Target Date  07/06/19      PT LONG TERM GOAL #3   Title  Pt will improve TUG and TUG cognitive score to less than 10% difference, for improved dual tasking/decreased fall risk with gait.    Time  7    Period  Weeks    Status  New    Target Date  07/06/19      PT LONG TERM GOAL #4  Title  Pt will improve gait velocity to at least 2 ft/sec for decreased fall risk, improved gait efficiency and safety.    Time  7    Period  Weeks    Status  New    Target Date  07/06/19      PT LONG TERM GOAL #5   Title  Pt/family will verbalize understanding of fall prevention in home environment.    Time  7    Period  Weeks    Status  New    Target Date  07/06/19            Plan - 06/04/19 1715    Clinical Impression Statement  Skilled session focused  on LE strengthening and stretching, HEP review, coordination and issues with turning in small spaces and walking to and around a target.  Pt and wife demonstrate consistency with HEP and pt reports less hip pain overall and really no sharp pain for the last couple of days. Trainig for movement patterns with sharp turns pt required intermittent UE support and cues for body mechanics.    Personal Factors and Comorbidities  Comorbidity 3+    Comorbidities  parkinson's disease with DBS (deep brain stimulator) placement R (04/2015), OA, hx of R TKR, depression    Examination-Activity Limitations  Locomotion Level;Stand;Sit    Stability/Clinical Decision Making  Evolving/Moderate complexity    Rehab Potential  Good    PT Frequency  2x / week    PT Duration  6 weeks   plus eval, so total POC = 7 weeks   PT Treatment/Interventions  ADLs/Self Care Home Management;Therapeutic exercise;Therapeutic activities;Functional mobility training;Gait training;DME Instruction;Balance training;Neuromuscular re-education;Patient/family education    PT Next Visit Plan  Review of sit to stand; initiate PWR! Moves in sitting, standing,gait training activities to address foot clearance, step length, heelstrike    Consulted and Agree with Plan of Care  Patient;Family member/caregiver    Family Member Consulted  wife       Patient will benefit from skilled therapeutic intervention in order to improve the following deficits and impairments:  Abnormal gait, Decreased mobility, Decreased balance, Decreased coordination, Decreased strength, Difficulty walking, Postural dysfunction, Pain  Visit Diagnosis: 1. Unsteadiness on feet   2. Other abnormalities of gait and mobility   3. Muscle weakness (generalized)   4. Abnormal posture        Problem List Patient Active Problem List   Diagnosis Date Noted  . Wound infection after surgery 03/09/2014  . 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 Hyperlipidemia   .  Incontinence of urine   . Parkinson disease ([place]) 02/21/2014  . 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 Diabetes ([place]) 02/19/2014  . 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

## 2019-06-05 DIAGNOSIS — S335XXD Sprain of ligaments of lumbar spine, subsequent encounter: Secondary | ICD-10-CM | POA: Diagnosis not present

## 2019-06-05 DIAGNOSIS — G20 Parkinson's disease: Secondary | ICD-10-CM | POA: Diagnosis not present

## 2019-06-05 DIAGNOSIS — M533 Sacrococcygeal disorders, not elsewhere classified: Secondary | ICD-10-CM | POA: Diagnosis not present

## 2019-06-11 ENCOUNTER — Ambulatory Visit: Payer: Medicare Other | Admitting: Physical Therapy

## 2019-06-12 DIAGNOSIS — S335XXD Sprain of ligaments of lumbar spine, subsequent encounter: Secondary | ICD-10-CM | POA: Diagnosis not present

## 2019-06-12 DIAGNOSIS — G20 Parkinson's disease: Secondary | ICD-10-CM | POA: Diagnosis not present

## 2019-06-12 DIAGNOSIS — M533 Sacrococcygeal disorders, not elsewhere classified: Secondary | ICD-10-CM | POA: Diagnosis not present

## 2019-06-15 ENCOUNTER — Ambulatory Visit: Payer: Medicare Other | Admitting: Physical Therapy

## 2019-06-15 DIAGNOSIS — Z9689 Presence of other specified functional implants: Secondary | ICD-10-CM | POA: Diagnosis not present

## 2019-06-15 DIAGNOSIS — Z1159 Encounter for screening for other viral diseases: Secondary | ICD-10-CM | POA: Diagnosis not present

## 2019-06-15 DIAGNOSIS — Z01812 Encounter for preprocedural laboratory examination: Secondary | ICD-10-CM | POA: Diagnosis not present

## 2019-06-15 DIAGNOSIS — G20 Parkinson's disease: Secondary | ICD-10-CM | POA: Diagnosis not present

## 2019-06-19 ENCOUNTER — Encounter: Payer: Self-pay | Admitting: Physical Therapy

## 2019-06-19 ENCOUNTER — Other Ambulatory Visit: Payer: Self-pay

## 2019-06-19 ENCOUNTER — Ambulatory Visit: Payer: Medicare Other | Attending: Neurology | Admitting: Physical Therapy

## 2019-06-19 DIAGNOSIS — R29818 Other symptoms and signs involving the nervous system: Secondary | ICD-10-CM | POA: Diagnosis not present

## 2019-06-19 DIAGNOSIS — M6281 Muscle weakness (generalized): Secondary | ICD-10-CM

## 2019-06-19 DIAGNOSIS — R293 Abnormal posture: Secondary | ICD-10-CM

## 2019-06-19 DIAGNOSIS — R2681 Unsteadiness on feet: Secondary | ICD-10-CM | POA: Insufficient documentation

## 2019-06-19 DIAGNOSIS — R2689 Other abnormalities of gait and mobility: Secondary | ICD-10-CM | POA: Diagnosis not present

## 2019-06-20 DIAGNOSIS — E119 Type 2 diabetes mellitus without complications: Secondary | ICD-10-CM | POA: Diagnosis not present

## 2019-06-20 DIAGNOSIS — Z4542 Encounter for adjustment and management of neuropacemaker (brain) (peripheral nerve) (spinal cord): Secondary | ICD-10-CM | POA: Diagnosis not present

## 2019-06-20 DIAGNOSIS — G20 Parkinson's disease: Secondary | ICD-10-CM | POA: Diagnosis not present

## 2019-06-20 NOTE — Therapy (Signed)
[facility] [address] [place], [place], [address] Phone: [phone]   Fax:  [phone]  Physical Therapy Treatment  Patient Details  Name: Randall [name] MRN: [id number] Date of Birth: Feb 05, 1941 Referring Provider (PT): Linus Mako   Encounter Date: 06/19/2019  CLINIC OPERATION CHANGES: Outpatient [facility] Re[facility] is open at lower capacity following universal masking, social distancing, and patient screening.  The patient's COVID risk of complications score is 4.   PT End of Session - 06/20/19 1418    Visit Number  4    Number of Visits  14    Date for PT Re-Evaluation  07/14/19    Authorization Type  [employer] Medicare-will need 10th visit progress note-no visit limit (auth required)    PT Start Time  1403    PT Stop Time  1447    PT Time Calculation (min)  44 min    Equipment Utilized During Treatment  Gait belt    Activity Tolerance  Patient tolerated treatment well    Behavior During Therapy  WFL for tasks assessed/performed       Past Medical History:  Diagnosis Date  . Anxiety   . Arthritis   . Diabetes mellitus without complication ([place])   . GERD (gastroesophageal reflux disease)   . Hyperlipidemia   . Incontinence of urine    DR  RON   DAVIS    LOV FEB. 2015  . Neuromuscular disorder ([place])   . Parkinson disease ([place])   . RSD (reflex sympathetic dystrophy)    2005    Past Surgical History:  Procedure Laterality Date  . APPENDECTOMY    . BRAIN SURGERY     deep brain stimulation for parkinsons  . CARDIAC CATHETERIZATION     2005  . COLON SURGERY     resection diverticulitis  . TOTAL KNEE ARTHROPLASTY Right 02/19/2014   Procedure: TOTAL KNEE ARTHROPLASTY;  Surgeon: Hessie Dibble, MD;  Location: [facility];  Service: Orthopedics;  Laterality: Right;    There were no vitals filed for this visit.  Subjective Assessment - 06/19/19 1405    Subjective  Will have to have replacement of deep brain stimulator  battery later this week.  No changes.  Hip pain is better in general.    Patient is accompained by:  Family member   wife   Pertinent History  parkinson's disease with DBS (deep brain stimulator) placement R (04/2015), OA, hx of R TKR, depression    Patient Stated Goals  Pt's goals for therapy are to improve balance, prevent falls, to help pain and "tilt."    Currently in Pain?  Yes    Pain Score  1     Pain Location  Hip    Pain Orientation  Left    Pain Descriptors / Indicators  Dull    Pain Type  Chronic pain    Pain Onset  More than a month ago    Aggravating Factors   getting in the bed    Pain Relieving Factors  ice helps                       [facility] Adult PT Treatment/Exercise - 06/20/19 1409      Transfers   Transfers  Sit to Stand;Stand to Sit    Sit to Stand  5: Supervision;With upper extremity assist;With armrests;From chair/3-in-1;Without upper extremity assist    Five time sit to stand comments   32.38   27.93 with UE  support   Stand to Sit  5: Supervision;With upper extremity assist;With armrests;To chair/3-in-1;Without upper extremity assist    Comments  Pt reports transfers have gotten better at home      Ambulation/Gait   Ambulation/Gait  Yes    Ambulation/Gait Assistance  5: Supervision    Ambulation/Gait Assistance Details  Cues for increased step length and arm swing; pt able to initiate this, but then reverts back to smaller step pattern, somewhat more imbalanced with larger step length.    Ambulation Distance (Feet)  230 Feet   x 2 reps, then 50 ft with walking pole   Assistive device  None   single walking pole   Gait Pattern  Step-through pattern;Decreased arm swing - right;Decreased arm swing - left;Decreased step length - right;Decreased step length - left;Decreased stride length;Decreased dorsiflexion - right;Decreased dorsiflexion - left;Right flexed knee in stance;Left flexed knee in stance;Shuffle;Lateral trunk lean to left;Decreased trunk  rotation;Trunk flexed;Narrow base of support;Poor foot clearance - left;Poor foot clearance - right    Ambulation Surface  Level;Indoor      Standardized Balance Assessment   Standardized Balance Assessment  Timed Up and Go Test      Timed Up and Go Test   TUG  Normal TUG    Normal TUG (seconds)  17.1      High Level Balance   High Level Balance Activities  Other (comment);Backward walking    High Level Balance Comments  Modified PWR! Moves in standing to address posture, weigthshifting and stepping:  PWR! Up at counter, with cues for upright posture and quad activation for decreased crouched position, x 10 reps; lateral weightshifting with UEs supported at counter, with cues for activation to extend knee on weightshifted side, x 10 reps, side step and weigthshfit x 10; forward step and weightshift x 10 reps with cues for quad activation, upright posture on front leg for improved weightshfiting and upright posture.  At counter, forward/back walking with cues in forward direction for foot clearance, step length, heelstrike with more upright posture noted.        Knee/Hip Exercises: Stretches   Active Hamstring Stretch  Both;3 reps;30 seconds   pillow behind back   Active Hamstring Stretch Limitations  propped foot on 4" block to allow for increased knee extension with stretch.  Pt continues to sit in posterior pelvic tilt.l          Balance Exercises - 06/19/19 1440      Balance Exercises: Standing   Other Standing Exercises  Stagger stance forward/back weigthshfiting x 10 reps at counter, with therapist providing tactile cues for improved weightshifiting          PT Short Term Goals - 06/19/19 1408      PT SHORT TERM GOAL #1   Title  Pt will perform HEP with wife's supervision to address PD-specific deficits to improve balance, transfers, gait.  TARGET 06/15/2019    Time  4    Period  Weeks    Status  Achieved    Target Date  06/15/19      PT SHORT TERM GOAL #2   Title  Pt  will improve 5x sit<>stand to less than or equal to 25 seconds for improved transfer efficiency and safety.    Baseline  27.93 sec at best; improving from 34.72    Time  4    Period  Weeks    Status  Partially Met    Target Date  06/15/19      PT   SHORT TERM GOAL #3   Title  Pt will improve TUG score to less than or equal to 22 seconds for decreased fall risk.    Time  4    Period  Weeks    Status  Achieved    Target Date  06/15/19      PT SHORT TERM GOAL #4   Title  Pt will ambulate at least 200 ft using least restrictive assistive device with supervision, for improved safety and efficiency with gait.    Baseline       Time  4    Period  Weeks    Status  Partially Met    Target Date  06/15/19      PT SHORT TERM GOAL #5   Title  Pt will verbalize at least 3 means to reduce L hip pain for improved overall functional mobility.    Baseline  exercises from therapy, with trainer, movement    Time  4    Period  Weeks    Status  Achieved    Target Date  06/15/19        PT Long Term Goals - 05/15/19 1617      PT LONG TERM GOAL #1   Title  Pt will verbalize plans for continued community fitness upon d/c from PT.  TARGET 07/06/2019    Time  7    Period  Weeks    Status  New    Target Date  07/06/19      PT LONG TERM GOAL #2   Title  Pt will improve 5x sit<>stand to less than or equal to 18 seconds for improved transfer efficiency and safety.    Time  7    Period  Weeks    Status  New    Target Date  07/06/19      PT LONG TERM GOAL #3   Title  Pt will improve TUG and TUG cognitive score to less than 10% difference, for improved dual tasking/decreased fall risk with gait.    Time  7    Period  Weeks    Status  New    Target Date  07/06/19      PT LONG TERM GOAL #4   Title  Pt will improve gait velocity to at least 2 ft/sec for decreased fall risk, improved gait efficiency and safety.    Time  7    Period  Weeks    Status  New    Target Date  07/06/19      PT LONG TERM  GOAL #5   Title  Pt/family will verbalize understanding of fall prevention in home environment.    Time  7    Period  Weeks    Status  New    Target Date  07/06/19            Plan - 06/20/19 1418    Clinical Impression Statement  Assessed STGs this visit, with pt meeting 3 of 5 STGs.  STG 2 partially met, with improvement noted in 5x sit<>stand score, just not to goal level; STG 4 PM for gait distance, with no device; though PT did introduc walking pole to patient at visit today.  With stepping activities and weigthshfiting during therapy session, pt is noted to have overall improved upright posture, decreased crouched position, and improved step length (with UE supported at counter); however, when he ambulates without assistance, he reverts back to more crouched posture and decreased step length/foot clearance.  Introduced single  walking pole today and pt is interested in trying again.  Pt will be getting new battery for deep brain stimulator later this week; therefore, second visit this week cancelled.  He will conitnue to benefit from skilled PT to address gait, posture, transfers, and balance for improved moiblity and decreased fall risk.    Personal Factors and Comorbidities  Comorbidity 3+    Comorbidities  parkinson's disease with DBS (deep brain stimulator) placement R (04/2015), OA, hx of R TKR, depression    Examination-Activity Limitations  Locomotion Level;Stand;Sit    Stability/Clinical Decision Making  Evolving/Moderate complexity    Rehab Potential  Good    PT Frequency  2x / week    PT Duration  6 weeks   plus eval, so total POC = 7 weeks   PT Treatment/Interventions  ADLs/Self Care Home Management;Therapeutic exercise;Therapeutic activities;Functional mobility training;Gait training;DME Instruction;Balance training;Neuromuscular re-education;Patient/family education    PT Next Visit Plan  Try standing PWR! Moves again (modified with UE support at counter), forward and back  step and weightshfit; try gait training with single walking pole or cane for additional stability with gait    Consulted and Agree with Plan of Care  Patient;Family member/caregiver    Family Member Consulted  wife       Patient will benefit from skilled therapeutic intervention in order to improve the following deficits and impairments:  Abnormal gait, Decreased mobility, Decreased balance, Decreased coordination, Decreased strength, Difficulty walking, Postural dysfunction, Pain  Visit Diagnosis: 1. Unsteadiness on feet   2. Other abnormalities of gait and mobility   3. Abnormal posture   4. Muscle weakness (generalized)        Problem List Patient Active Problem List   Diagnosis Date Noted  . Wound infection after surgery 03/09/2014  . 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 Hyperlipidemia   . Incontinence of urine   . Parkinson disease ([facility]) 02/21/2014  . 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 Diabetes ([facility]) 02/19/2014  . 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 06/20/2019, 2:24 PM  , W., PT   [facility] [address] [place], [place], [address] Phone: [phone]   Fax:  [phone]  Name: Randall [name] MRN: [id number] Date of Birth: 08/31/1941   

## 2019-06-22 ENCOUNTER — Ambulatory Visit: Payer: Medicare Other | Admitting: Physical Therapy

## 2019-06-22 DIAGNOSIS — E119 Type 2 diabetes mellitus without complications: Secondary | ICD-10-CM | POA: Diagnosis not present

## 2019-06-22 DIAGNOSIS — F329 Major depressive disorder, single episode, unspecified: Secondary | ICD-10-CM | POA: Diagnosis not present

## 2019-06-22 DIAGNOSIS — Z794 Long term (current) use of insulin: Secondary | ICD-10-CM | POA: Diagnosis not present

## 2019-06-22 DIAGNOSIS — G20 Parkinson's disease: Secondary | ICD-10-CM | POA: Diagnosis not present

## 2019-06-22 DIAGNOSIS — Z4542 Encounter for adjustment and management of neuropacemaker (brain) (peripheral nerve) (spinal cord): Secondary | ICD-10-CM | POA: Diagnosis not present

## 2019-06-22 DIAGNOSIS — E785 Hyperlipidemia, unspecified: Secondary | ICD-10-CM | POA: Diagnosis not present

## 2019-06-22 DIAGNOSIS — R413 Other amnesia: Secondary | ICD-10-CM | POA: Diagnosis not present

## 2019-06-22 DIAGNOSIS — K8689 Other specified diseases of pancreas: Secondary | ICD-10-CM | POA: Diagnosis not present

## 2019-06-25 ENCOUNTER — Other Ambulatory Visit: Payer: Self-pay

## 2019-06-25 ENCOUNTER — Ambulatory Visit: Payer: Medicare Other | Admitting: Physical Therapy

## 2019-06-25 ENCOUNTER — Encounter: Payer: Self-pay | Admitting: Physical Therapy

## 2019-06-25 DIAGNOSIS — M6281 Muscle weakness (generalized): Secondary | ICD-10-CM | POA: Diagnosis not present

## 2019-06-25 DIAGNOSIS — R2681 Unsteadiness on feet: Secondary | ICD-10-CM | POA: Diagnosis not present

## 2019-06-25 DIAGNOSIS — R29818 Other symptoms and signs involving the nervous system: Secondary | ICD-10-CM | POA: Diagnosis not present

## 2019-06-25 DIAGNOSIS — R293 Abnormal posture: Secondary | ICD-10-CM | POA: Diagnosis not present

## 2019-06-25 DIAGNOSIS — R2689 Other abnormalities of gait and mobility: Secondary | ICD-10-CM

## 2019-06-25 NOTE — Therapy (Signed)
[facility] [address] [place], [place], [address] Phone: [phone]   Fax:  [phone]  Physical Therapy Treatment  Patient Details  Name: Randall [name] MRN: [id number] Date of Birth: May 06, 1941 Referring Provider (PT): Linus Mako   Encounter Date: 06/25/2019  PT End of Session - 06/25/19 1524    Visit Number  5    Number of Visits  14    Date for PT Re-Evaluation  07/14/19    Authorization Type  [employer] Medicare-will need 10th visit progress note-no visit limit (auth required)    PT Start Time  1300    PT Stop Time  1345    PT Time Calculation (min)  45 min    Equipment Utilized During Treatment  Gait belt    Activity Tolerance  Patient tolerated treatment well    Behavior During Therapy  [facility] for tasks assessed/performed       Past Medical History:  Diagnosis Date  . Anxiety   . Arthritis   . Diabetes mellitus without complication ([place])   . GERD (gastroesophageal reflux disease)   . Hyperlipidemia   . Incontinence of urine    DR  RON   DAVIS    LOV FEB. 2015  . Neuromuscular disorder ([place])   . Parkinson disease ([place])   . RSD (reflex sympathetic dystrophy)    2005    Past Surgical History:  Procedure Laterality Date  . APPENDECTOMY    . BRAIN SURGERY     deep brain stimulation for parkinsons  . CARDIAC CATHETERIZATION     2005  . COLON SURGERY     resection diverticulitis  . TOTAL KNEE ARTHROPLASTY Right 02/19/2014   Procedure: TOTAL KNEE ARTHROPLASTY;  Surgeon: Hessie Dibble, MD;  Location: [facility];  Service: Orthopedics;  Laterality: Right;    There were no vitals filed for this visit.  Subjective Assessment - 06/25/19 1303    Subjective  Pt reports that medical procedure went well. Pt brought walker in today reports for safety.    Patient is accompained by:  Family member   wife   Pertinent History  parkinson's disease with DBS (deep brain stimulator) placement R (04/2015), OA, hx of R TKR,  depression    Patient Stated Goals  Pt's goals for therapy are to improve balance, prevent falls, to help pain and "tilt."    Currently in Pain?  No/denies    Pain Onset  More than a month ago                       [facility] Adult PT Treatment/Exercise - 06/25/19 0001      Transfers   Transfers  Sit to Stand;Stand to Sit    Sit to Stand  5: Supervision;With upper extremity assist;With armrests;From chair/3-in-1;Without upper extremity assist    Stand to Sit  5: Supervision;With upper extremity assist;With armrests;To chair/3-in-1;Without upper extremity assist    Transfer Cueing  cues for sequence and body mechanics      Ambulation/Gait   Ambulation/Gait  Yes    Ambulation/Gait Assistance  5: Supervision    Ambulation/Gait Assistance Details  Worked on posture, maintaining appropriate steplength. walked initially with walker then with walking poles ( Mod cues for pole placement)  Ambulation Distance (Feet)  230 Feet   with single walking pole, 150 with walker.   Assistive device  Rolling walker;Other (Comment)   single walking pole   Gait Pattern  Step-through pattern;Decreased arm swing - right;Decreased arm swing - left;Decreased step length - right;Decreased step length - left;Decreased stride length;Decreased dorsiflexion - right;Decreased dorsiflexion - left;Right flexed knee in stance;Left flexed knee in stance;Shuffle;Lateral trunk lean to left;Decreased trunk rotation;Trunk flexed;Narrow base of support;Poor foot clearance - left;Poor foot clearance - right    Ambulation Surface  Level;Indoor      Knee/Hip Exercises: Stretches   Active Hamstring Stretch  Both;60 seconds;2 reps   with quad set       PWR [facility]) - 06/25/19 1330   STANDING   PWR! Up  x10    PWR! Rock  x10    PWR! Twist  x10    PWR Step  x10    Basic 4 Flow  x10    Comments  cues for posture, sequence, bilateral knee extension          PT Education  - 06/25/19 1526    Education Details  Benefits of using an AD with gait and encouraged pt to continue to use his rollator to decrease fall risk.    Person(s) Educated  Patient    Methods  Explanation    Comprehension  Verbalized understanding       PT Short Term Goals - 06/19/19 1408      PT SHORT TERM GOAL #1   Title  Pt will perform HEP with wife's supervision to address PD-specific deficits to improve balance, transfers, gait.  TARGET 06/15/2019    Time  4    Period  Weeks    Status  Achieved    Target Date  06/15/19      PT SHORT TERM GOAL #2   Title  Pt will improve 5x sit<>stand to less than or equal to 25 seconds for improved transfer efficiency and safety.    Baseline  27.93 sec at best; improving from 34.72    Time  4    Period  Weeks    Status  Partially Met    Target Date  06/15/19      PT SHORT TERM GOAL #3   Title  Pt will improve TUG score to less than or equal to 22 seconds for decreased fall risk.    Time  4    Period  Weeks    Status  Achieved    Target Date  06/15/19      PT SHORT TERM GOAL #4   Title  Pt will ambulate at least 200 ft using least restrictive assistive device with supervision, for improved safety and efficiency with gait.    Baseline       Time  4    Period  Weeks    Status  Partially Met    Target Date  06/15/19      PT SHORT TERM GOAL #5   Title  Pt will verbalize at least 3 means to reduce L hip pain for improved overall functional mobility.    Baseline  exercises from therapy, with trainer, movement    Time  4    Period  Weeks    Status  Achieved    Target Date  06/15/19        PT Long Term Goals - 05/15/19 1617      PT LONG TERM GOAL #1   Title  Pt will verbalize  plans for continued community fitness upon d/c from PT.  TARGET 07/06/2019    Time  7    Period  Weeks    Status  New    Target Date  07/06/19      PT LONG TERM GOAL #2   Title  Pt will improve 5x sit<>stand to less than or equal to 18 seconds for improved  transfer efficiency and safety.    Time  7    Period  Weeks    Status  New    Target Date  07/06/19      PT LONG TERM GOAL #3   Title  Pt will improve TUG and TUG cognitive score to less than 10% difference, for improved dual tasking/decreased fall risk with gait.    Time  7    Period  Weeks    Status  New    Target Date  07/06/19      PT LONG TERM GOAL #4   Title  Pt will improve gait velocity to at least 2 ft/sec for decreased fall risk, improved gait efficiency and safety.    Time  7    Period  Weeks    Status  New    Target Date  07/06/19      PT LONG TERM GOAL #5   Title  Pt/family will verbalize understanding of fall prevention in home environment.    Time  7    Period  Weeks    Status  New    Target Date  07/06/19            Plan - 06/25/19 1331    Clinical Impression Statement  Skilled session focused on discussing safety with AD, gait training, dynamic standing balance.  Pt did bring RW this visit and verbalized that he uses it for safety; uses it at home but reports that he should use it more to go to the bathroom at night. Educated pt on safety and recommend pt use walker all the time with gait.  Pt had more consistent increase in steplength with walker vs. walking poles. Training for standing balance and posture with PWR! Moves; pt required verbal and manual cues and intermittent UE support.    Personal Factors and Comorbidities  Comorbidity 3+    Comorbidities  parkinson's disease with DBS (deep brain stimulator) placement R (04/2015), OA, hx of R TKR, depression    Examination-Activity Limitations  Locomotion Level;Stand;Sit    Stability/Clinical Decision Making  Evolving/Moderate complexity    Rehab Potential  Good    PT Frequency  2x / week    PT Duration  6 weeks   plus eval, so total POC = 7 weeks   PT Treatment/Interventions  ADLs/Self Care Home Management;Therapeutic exercise;Therapeutic activities;Functional mobility training;Gait training;DME  Instruction;Balance training;Neuromuscular re-education;Patient/family education    PT Next Visit Plan  Try standing PWR! Moves again (modified with UE support at counter), forward and back step and weightshfit; try gait training with single walking pole or cane for additional stability with gait    Consulted and Agree with Plan of Care  Patient;Family member/caregiver    Family Member Consulted  wife       Patient will benefit from skilled therapeutic intervention in order to improve the following deficits and impairments:  Abnormal gait, Decreased mobility, Decreased balance, Decreased coordination, Decreased strength, Difficulty walking, Postural dysfunction, Pain  Visit Diagnosis: 1. Unsteadiness on feet   2. Other abnormalities of gait and mobility   3. Abnormal posture  4. Muscle weakness (generalized)   5. Other symptoms and signs involving the nervous system        Problem List Patient Active Problem List   Diagnosis Date Noted  . Wound infection after surgery 03/09/2014  . 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 Hyperlipidemia   . Incontinence of urine   . Parkinson disease ([place]) 02/21/2014  . 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 Diabetes ([place]) 02/19/2014  . 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 [place], [place], [address] Phone: [phone]   Fax:  [phone]  Name: Randall [name] MRN: [id number] Date of Birth: 27-Nov-1941

## 2019-06-27 ENCOUNTER — Encounter: Payer: Self-pay | Admitting: Physical Therapy

## 2019-06-27 ENCOUNTER — Ambulatory Visit: Payer: Medicare Other | Admitting: Physical Therapy

## 2019-06-27 ENCOUNTER — Other Ambulatory Visit: Payer: Self-pay

## 2019-06-27 DIAGNOSIS — R29818 Other symptoms and signs involving the nervous system: Secondary | ICD-10-CM | POA: Diagnosis not present

## 2019-06-27 DIAGNOSIS — R2689 Other abnormalities of gait and mobility: Secondary | ICD-10-CM

## 2019-06-27 DIAGNOSIS — R293 Abnormal posture: Secondary | ICD-10-CM | POA: Diagnosis not present

## 2019-06-27 DIAGNOSIS — M6281 Muscle weakness (generalized): Secondary | ICD-10-CM | POA: Diagnosis not present

## 2019-06-27 DIAGNOSIS — R2681 Unsteadiness on feet: Secondary | ICD-10-CM | POA: Diagnosis not present

## 2019-06-27 NOTE — Therapy (Signed)
[facility] [address] [place], [place], [address] Phone: [phone]   Fax:  [phone]  Physical Therapy Treatment  Patient Details  Name: Randall [name] MRN: [id number] Date of Birth: [birth year] Referring Provider (PT): Linus Mako   Encounter Date: 06/27/2019  PT End of Session - 06/27/19 1447    Visit Number  6    Number of Visits  14    Date for PT Re-Evaluation  07/14/19    Authorization Type  [employer] Medicare-will need 10th visit progress note-no visit limit (auth required)    PT Start Time  1400    PT Stop Time  1440    PT Time Calculation (min)  40 min    Equipment Utilized During Treatment  Gait belt    Activity Tolerance  Patient tolerated treatment well    Behavior During Therapy  [facility] for tasks assessed/performed       Past Medical History:  Diagnosis Date  . Anxiety   . Arthritis   . Diabetes mellitus without complication ([place])   . GERD (gastroesophageal reflux disease)   . Hyperlipidemia   . Incontinence of urine    DR  RON   DAVIS    LOV FEB. 2015  . Neuromuscular disorder ([place])   . Parkinson disease ([place])   . RSD (reflex sympathetic dystrophy)    2005    Past Surgical History:  Procedure Laterality Date  . APPENDECTOMY    . BRAIN SURGERY     deep brain stimulation for parkinsons  . CARDIAC CATHETERIZATION     2005  . COLON SURGERY     resection diverticulitis  . TOTAL KNEE ARTHROPLASTY Right 02/19/2014   Procedure: TOTAL KNEE ARTHROPLASTY;  Surgeon: Hessie Dibble, MD;  Location: [facility];  Service: Orthopedics;  Laterality: Right;    There were no vitals filed for this visit.  Subjective Assessment - 06/27/19 1403    Subjective  Nothing new to report.    Patient is accompained by:  Family member   wife   Pertinent History  parkinson's disease with DBS (deep brain stimulator) placement R (04/2015), OA, hx of R TKR, depression    Patient Stated Goals  Pt's goals for therapy are to  improve balance, prevent falls, to help pain and "tilt."    Currently in Pain?  No/denies    Pain Onset  More than a month ago                       [facility] Adult PT Treatment/Exercise - 06/27/19 0001      Transfers   Transfers  Sit to Stand;Stand to Sit;Stand Pivot Transfers    Sit to Stand  5: Supervision;With upper extremity assist;With armrests;From chair/3-in-1;Without upper extremity assist    Stand to Sit  5: Supervision;With upper extremity assist;With armrests;To chair/3-in-1;Without upper extremity assist    Stand Pivot Transfers  5: Supervision    Stand Pivot Transfer Details (indicate cue type and reason)  worked on maintaining appropriate steplength with approaching sitting surface and coordination with turns and stepping back to chair with RW                                                  Ambulation/Gait   Ambulation/Gait  Yes    Ambulation/Gait Assistance  5: Supervision  Ambulation/Gait Assistance Details  training: increased steplength, posture, obstalce negotiation.    Ambulation Distance (Feet)  350 Feet    Assistive device  Rolling walker    Gait Pattern  Step-through pattern;Decreased arm swing - right;Decreased arm swing - left;Decreased step length - right;Decreased step length - left;Decreased stride length;Decreased dorsiflexion - right;Decreased dorsiflexion - left;Right flexed knee in stance;Left flexed knee in stance;Shuffle;Lateral trunk lean to left;Decreased trunk rotation;Trunk flexed;Narrow base of support;Poor foot clearance - left;Poor foot clearance - right    Ambulation Surface  Level;Indoor    Stairs  Yes    Ramp  5: Supervision    Ramp Details (indicate cue type and reason)  cues for walker placement    Curb  5: Supervision    Curb Details (indicate cue type and reason)  cues for safe sequence and walker placement.      Knee/Hip Exercises: Stretches   Active Hamstring Stretch  Both;60 seconds;2 reps   with quad set          Balance Exercises - 06/27/19 1440      Balance Exercises: Standing   Stepping Strategy  Lateral   without UE support; mirroring PTA's movements   Sit to Stand Time  without UE support, transitioning to a stand with UE out wide and chest forward to work on posture          PT Short Term Goals - 06/19/19 1408      PT SHORT TERM GOAL #1   Title  Pt will perform HEP with wife's supervision to address PD-specific deficits to improve balance, transfers, gait.  TARGET 06/15/2019    Time  4    Period  Weeks    Status  Achieved    Target Date  06/15/19      PT SHORT TERM GOAL #2   Title  Pt will improve 5x sit<>stand to less than or equal to 25 seconds for improved transfer efficiency and safety.    Baseline  27.93 sec at best; improving from 34.72    Time  4    Period  Weeks    Status  Partially Met    Target Date  06/15/19      PT SHORT TERM GOAL #3   Title  Pt will improve TUG score to less than or equal to 22 seconds for decreased fall risk.    Time  4    Period  Weeks    Status  Achieved    Target Date  06/15/19      PT SHORT TERM GOAL #4   Title  Pt will ambulate at least 200 ft using least restrictive assistive device with supervision, for improved safety and efficiency with gait.    Baseline       Time  4    Period  Weeks    Status  Partially Met    Target Date  06/15/19      PT SHORT TERM GOAL #5   Title  Pt will verbalize at least 3 means to reduce L hip pain for improved overall functional mobility.    Baseline  exercises from therapy, with trainer, movement    Time  4    Period  Weeks    Status  Achieved    Target Date  06/15/19        PT Long Term Goals - 05/15/19 1617      PT LONG TERM GOAL #1   Title  Pt will verbalize plans for continued community fitness  upon d/c from PT.  TARGET 07/06/2019    Time  7    Period  Weeks    Status  New    Target Date  07/06/19      PT LONG TERM GOAL #2   Title  Pt will improve 5x sit<>stand to less than  or equal to 18 seconds for improved transfer efficiency and safety.    Time  7    Period  Weeks    Status  New    Target Date  07/06/19      PT LONG TERM GOAL #3   Title  Pt will improve TUG and TUG cognitive score to less than 10% difference, for improved dual tasking/decreased fall risk with gait.    Time  7    Period  Weeks    Status  New    Target Date  07/06/19      PT LONG TERM GOAL #4   Title  Pt will improve gait velocity to at least 2 ft/sec for decreased fall risk, improved gait efficiency and safety.    Time  7    Period  Weeks    Status  New    Target Date  07/06/19      PT LONG TERM GOAL #5   Title  Pt/family will verbalize understanding of fall prevention in home environment.    Time  7    Period  Weeks    Status  New    Target Date  07/06/19            Plan - 06/27/19 1418    Clinical Impression Statement  Skilled session focused on functional gait training with RW, transfer training, and standing balance/stepping training.  Pt progress with gait distance and demonstrated consistent increase in steplength overall.  Pt required min cues for reminders for safe technique for community obstacle negotiation; performed at supervision level.  Pt demonstrates an overall greater ease  with sit to stands and with turning to sit using walker. Standing balance, weight shifting, and stepping strategies were performed with mat table behind pt but without UE support and with this PTA performing/mirroring movments with pt; pt required min cues for technique.    Personal Factors and Comorbidities  Comorbidity 3+    Comorbidities  parkinson's disease with DBS (deep brain stimulator) placement R (04/2015), OA, hx of R TKR, depression    Examination-Activity Limitations  Locomotion Level;Stand;Sit    Stability/Clinical Decision Making  Evolving/Moderate complexity    Rehab Potential  Good    PT Frequency  2x / week    PT Duration  6 weeks   plus eval, so total POC = 7 weeks   PT  Treatment/Interventions  ADLs/Self Care Home Management;Therapeutic exercise;Therapeutic activities;Functional mobility training;Gait training;DME Instruction;Balance training;Neuromuscular re-education;Patient/family education    PT Next Visit Plan  Try standing PWR! Moves again (modified with UE support at counter), forward and back step and weightshfit; try gait training with single walking pole or cane for additional stability with gait    Consulted and Agree with Plan of Care  Patient;Family member/caregiver    Family Member Consulted  wife       Patient will benefit from skilled therapeutic intervention in order to improve the following deficits and impairments:  Abnormal gait, Decreased mobility, Decreased balance, Decreased coordination, Decreased strength, Difficulty walking, Postural dysfunction, Pain  Visit Diagnosis: 1. Other abnormalities of gait and mobility   2. Unsteadiness on feet   3. Abnormal posture   4.  Muscle weakness (generalized)        Problem List Patient Active Problem List   Diagnosis Date Noted  . Wound infection after surgery 03/09/2014  . 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 Hyperlipidemia   . Incontinence of urine   . Parkinson disease ([place]) 02/21/2014  . 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 Diabetes ([place]) 02/19/2014  . 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

## 2019-07-02 ENCOUNTER — Ambulatory Visit: Payer: Medicare Other | Admitting: Physical Therapy

## 2019-07-02 ENCOUNTER — Other Ambulatory Visit: Payer: Self-pay

## 2019-07-02 DIAGNOSIS — R2689 Other abnormalities of gait and mobility: Secondary | ICD-10-CM | POA: Diagnosis not present

## 2019-07-02 DIAGNOSIS — R29818 Other symptoms and signs involving the nervous system: Secondary | ICD-10-CM | POA: Diagnosis not present

## 2019-07-02 DIAGNOSIS — R293 Abnormal posture: Secondary | ICD-10-CM | POA: Diagnosis not present

## 2019-07-02 DIAGNOSIS — M6281 Muscle weakness (generalized): Secondary | ICD-10-CM | POA: Diagnosis not present

## 2019-07-02 DIAGNOSIS — R2681 Unsteadiness on feet: Secondary | ICD-10-CM

## 2019-07-02 NOTE — Therapy (Signed)
[facility] [address] [place], [place], [address] Phone: [phone]   Fax:  [phone]  Physical Therapy Treatment  Patient Details  Name: Randall [name] MRN: [id number] Date of Birth: [birth year] Referring Provider (PT): Linus Mako   Encounter Date: 07/02/2019  CLINIC OPERATION CHANGES: Outpatient Neuro Rehab is open at lower capacity following universal masking, social distancing, and patient screening.  The patient's COVID risk of complications score is 4.   PT End of Session - 07/02/19 1853    Visit Number  7    Number of Visits  14    Date for PT Re-Evaluation  07/14/19    Authorization Type  BCBS Medicare-will need 10th visit progress note-no visit limit (auth required)    PT Start Time  1801    PT Stop Time  1842    PT Time Calculation (min)  41 min    Activity Tolerance  Patient tolerated treatment well    Behavior During Therapy  WFL for tasks assessed/performed       Past Medical History:  Diagnosis Date  . Anxiety   . Arthritis   . Diabetes mellitus without complication ([place])   . GERD (gastroesophageal reflux disease)   . Hyperlipidemia   . Incontinence of urine    DR  RON   DAVIS    LOV FEB. 2015  . Neuromuscular disorder ([place])   . Parkinson disease ([place])   . RSD (reflex sympathetic dystrophy)    2005    Past Surgical History:  Procedure Laterality Date  . APPENDECTOMY    . BRAIN SURGERY     deep brain stimulation for parkinsons  . CARDIAC CATHETERIZATION     2005  . COLON SURGERY     resection diverticulitis  . TOTAL KNEE ARTHROPLASTY Right 02/19/2014   Procedure: TOTAL KNEE ARTHROPLASTY;  Surgeon: Hessie Dibble, MD;  Location: [facility];  Service: Orthopedics;  Laterality: Right;    There were no vitals filed for this visit.  Subjective Assessment - 07/02/19 1803    Subjective  Nothing new since last visit.  "All charged up" from when I got the new battery for the DBS.    Patient  is accompained by:  Family member   wife   Pertinent History  parkinson's disease with DBS (deep brain stimulator) placement R (04/2015), OA, hx of R TKR, depression    Patient Stated Goals  Pt's goals for therapy are to improve balance, prevent falls, to help pain and "tilt."    Currently in Pain?  No/denies    Pain Onset  More than a month ago                       [facility] Adult PT Treatment/Exercise - 07/02/19 0001      Transfers   Transfers  Sit to Stand;Stand to Sit    Sit to Stand  5: Supervision;With upper extremity assist;With armrests;From chair/3-in-1;From bed    Sit to Stand Details  Verbal cues for technique;Verbal cues for sequencing    Stand to Sit  5: Supervision;With upper extremity assist;With armrests;To chair/3-in-1;To bed    Stand to Sit Details (indicate cue type and reason)  Verbal cues for technique   VCs for hand placement   Number of Reps  2 sets;Other reps (comment)   5 reps from mat, 5 reps from chair     Ambulation/Gait   Ambulation/Gait  Yes    Ambulation/Gait Assistance  5: Supervision  Ambulation/Gait Assistance Details  Cues for upright posture, to increase step length and heelstrike, foot clearance    Ambulation Distance (Feet)  115 Feet   then 230 ft, 80 ft x 2   Assistive device  Rolling walker    Gait Pattern  Step-through pattern;Decreased step length - right;Decreased step length - left;Decreased stride length;Decreased dorsiflexion - right;Decreased dorsiflexion - left;Right flexed knee in stance;Left flexed knee in stance;Shuffle;Decreased trunk rotation;Trunk flexed;Narrow base of support;Poor foot clearance - left;Poor foot clearance - right   Gait pattern improves with cues   Ambulation Surface  Level;Indoor        PWR [facility]) - 07/02/19 1815    PWR! exercises  Moves in standing    PWR! Up  x 10   UE at counter, push away from counter, cues for posture   PWR! Rock  x 10 BUEs at counter lateral rock with cues, then x 10  reps with rock and reach    PWR Step  x 10 reps BUEs supported at counter, with lateral step, cues for increased weightshift and foot clearance with step and return to center position    Comments  Cues for posture, sequence, bilateral knee extension with UE support at counter       Balance Exercises - 07/02/19 1850      Balance Exercises: Standing   Stepping Strategy  Anterior;Posterior;UE support;10 reps    Heel Raises Limitations  x 10    Toe Raise Limitations  x 10    Other Standing Exercises  Cues throughout standing exercises for widened BOS.  Standing at counter, forward/back weightshifting through hips for hip strategy work, x 10 reps          PT Short Term Goals - 06/19/19 1408      PT SHORT TERM GOAL #1   Title  Pt will perform HEP with wife's supervision to address PD-specific deficits to improve balance, transfers, gait.  TARGET 06/15/2019    Time  4    Period  Weeks    Status  Achieved    Target Date  06/15/19      PT SHORT TERM GOAL #2   Title  Pt will improve 5x sit<>stand to less than or equal to 25 seconds for improved transfer efficiency and safety.    Baseline  27.93 sec at best; improving from 34.72    Time  4    Period  Weeks    Status  Partially Met    Target Date  06/15/19      PT SHORT TERM GOAL #3   Title  Pt will improve TUG score to less than or equal to 22 seconds for decreased fall risk.    Time  4    Period  Weeks    Status  Achieved    Target Date  06/15/19      PT SHORT TERM GOAL #4   Title  Pt will ambulate at least 200 ft using least restrictive assistive device with supervision, for improved safety and efficiency with gait.    Baseline       Time  4    Period  Weeks    Status  Partially Met    Target Date  06/15/19      PT SHORT TERM GOAL #5   Title  Pt will verbalize at least 3 means to reduce L hip pain for improved overall functional mobility.    Baseline  exercises from therapy, with trainer, movement    Time  4    Period   Weeks    Status  Achieved    Target Date  06/15/19        PT Long Term Goals - 05/15/19 1617      PT LONG TERM GOAL #1   Title  Pt will verbalize plans for continued community fitness upon d/c from PT.  TARGET 07/06/2019    Time  7    Period  Weeks    Status  New    Target Date  07/06/19      PT LONG TERM GOAL #2   Title  Pt will improve 5x sit<>stand to less than or equal to 18 seconds for improved transfer efficiency and safety.    Time  7    Period  Weeks    Status  New    Target Date  07/06/19      PT LONG TERM GOAL #3   Title  Pt will improve TUG and TUG cognitive score to less than 10% difference, for improved dual tasking/decreased fall risk with gait.    Time  7    Period  Weeks    Status  New    Target Date  07/06/19      PT LONG TERM GOAL #4   Title  Pt will improve gait velocity to at least 2 ft/sec for decreased fall risk, improved gait efficiency and safety.    Time  7    Period  Weeks    Status  New    Target Date  07/06/19      PT LONG TERM GOAL #5   Title  Pt/family will verbalize understanding of fall prevention in home environment.    Time  7    Period  Weeks    Status  New    Target Date  07/06/19            Plan - 07/02/19 1853    Clinical Impression Statement  Skilled PT session focused on gait training with RW, transfer training, and balance training.  With standing PWR! Moves at counter as well as hip/ankle/step strategies, pt requires UE support and cues for correct technique.  He tends to have narrowed BOS in standing and decreased weightshifting with step strategies.  He will continue to benefit from skilled PT to furhter address posture, balance, and gait training.    Personal Factors and Comorbidities  Comorbidity 3+    Comorbidities  parkinson's disease with DBS (deep brain stimulator) placement R (04/2015), OA, hx of R TKR, depression    Examination-Activity Limitations  Locomotion Level;Stand;Sit    Stability/Clinical Decision  Making  Evolving/Moderate complexity    Rehab Potential  Good    PT Frequency  2x / week    PT Duration  6 weeks   plus eval, so total POC = 7 weeks   PT Treatment/Interventions  ADLs/Self Care Home Management;Therapeutic exercise;Therapeutic activities;Functional mobility training;Gait training;DME Instruction;Balance training;Neuromuscular re-education;Patient/family education    PT Next Visit Plan  Try standing PWR! Moves again (modified with UE support at counter), forward and back step and weightshift; gait training for distance (as [place] going [place] beach [place]); hip/ankle/step strategy work   07/02/2019 week is week 5 of 6 in [place]; plan to check goals next week and likely extend POC-pt is agreeable to scheduling additional visits   Consulted and Agree with Plan of Care  Patient;Family member/caregiver    Family Member Consulted  wife  Patient will benefit from skilled therapeutic intervention in order to improve the following deficits and impairments:  Abnormal gait, Decreased mobility, Decreased balance, Decreased coordination, Decreased strength, Difficulty walking, Postural dysfunction, Pain  Visit Diagnosis: 1. Other abnormalities of gait and mobility   2. Unsteadiness on feet   3. Abnormal posture        Problem List Patient Active Problem List   Diagnosis Date Noted  . Wound infection after surgery 03/09/2014  . 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 Hyperlipidemia   . Incontinence of urine   . Parkinson disease ([place]) 02/21/2014  . 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 Diabetes ([place]) 02/19/2014  . 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 07/02/2019, 6:58 PM Frazier Butt., PT [facility] [address] [place], [place], [address] Phone: [phone]   Fax:  [phone]  Name: LEVAUGHN PUCCINELLI MRN: [id number] Date of Birth: [birth year]

## 2019-07-03 ENCOUNTER — Ambulatory Visit: Payer: Medicare Other | Admitting: Physical Therapy

## 2019-07-05 DIAGNOSIS — Z9689 Presence of other specified functional implants: Secondary | ICD-10-CM | POA: Diagnosis not present

## 2019-07-05 DIAGNOSIS — Z09 Encounter for follow-up examination after completed treatment for conditions other than malignant neoplasm: Secondary | ICD-10-CM | POA: Diagnosis not present

## 2019-07-05 DIAGNOSIS — G20 Parkinson's disease: Secondary | ICD-10-CM | POA: Diagnosis not present

## 2019-07-05 DIAGNOSIS — S335XXD Sprain of ligaments of lumbar spine, subsequent encounter: Secondary | ICD-10-CM | POA: Diagnosis not present

## 2019-07-05 DIAGNOSIS — M533 Sacrococcygeal disorders, not elsewhere classified: Secondary | ICD-10-CM | POA: Diagnosis not present

## 2019-07-06 ENCOUNTER — Ambulatory Visit: Payer: Medicare Other | Admitting: Physical Therapy

## 2019-07-09 DIAGNOSIS — L98491 Non-pressure chronic ulcer of skin of other sites limited to breakdown of skin: Secondary | ICD-10-CM | POA: Diagnosis not present

## 2019-07-11 DIAGNOSIS — M25562 Pain in left knee: Secondary | ICD-10-CM | POA: Diagnosis not present

## 2019-07-16 DIAGNOSIS — Z0120 Encounter for dental examination and cleaning without abnormal findings: Secondary | ICD-10-CM | POA: Diagnosis not present

## 2019-07-17 ENCOUNTER — Other Ambulatory Visit: Payer: Self-pay

## 2019-07-17 ENCOUNTER — Ambulatory Visit: Payer: Medicare Other | Attending: Neurology | Admitting: Physical Therapy

## 2019-07-17 DIAGNOSIS — R2681 Unsteadiness on feet: Secondary | ICD-10-CM | POA: Diagnosis not present

## 2019-07-17 DIAGNOSIS — R2689 Other abnormalities of gait and mobility: Secondary | ICD-10-CM | POA: Diagnosis not present

## 2019-07-17 DIAGNOSIS — R293 Abnormal posture: Secondary | ICD-10-CM | POA: Diagnosis not present

## 2019-07-17 NOTE — Therapy (Addendum)
[facility] [address] [place], [place], [address] Phone: [phone]   Fax:  [phone]  Physical Therapy Treatment  Patient Details  Name: Randall [name] MRN: [id number] Date of Birth: 10-11-1941 Referring Provider (PT): Linus Mako   Encounter Date: 07/17/2019  PT End of Session - 07/17/19 1207    Visit Number  8    Number of Visits  14    Date for PT Re-Evaluation  07/14/19    Authorization Type  BCBS Medicare-will need 10th visit progress note-no visit limit (auth required)    PT Start Time  1104    PT Stop Time  1145    PT Time Calculation (min)  41 min    Activity Tolerance  Patient tolerated treatment well    Behavior During Therapy  [facility] for tasks assessed/performed       Past Medical History:  Diagnosis Date  . Anxiety   . Arthritis   . Diabetes mellitus without complication ([place])   . GERD (gastroesophageal reflux disease)   . Hyperlipidemia   . Incontinence of urine    DR  RON   DAVIS    LOV FEB. 2015  . Neuromuscular disorder ([place])   . Parkinson disease ([place])   . RSD (reflex sympathetic dystrophy)    2005    Past Surgical History:  Procedure Laterality Date  . APPENDECTOMY    . BRAIN SURGERY     deep brain stimulation for parkinsons  . CARDIAC CATHETERIZATION     2005  . COLON SURGERY     resection diverticulitis  . TOTAL KNEE ARTHROPLASTY Right 02/19/2014   Procedure: TOTAL KNEE ARTHROPLASTY;  Surgeon: Hessie Dibble, MD;  Location: [facility];  Service: Orthopedics;  Laterality: Right;    There were no vitals filed for this visit.  Subjective Assessment - 07/17/19 1107    Subjective  Denies falls or changes since last visit.    Patient is accompained by:  Family member   wife   Pertinent History  parkinson's disease with DBS (deep brain stimulator) placement R (04/2015), OA, hx of R TKR, depression    Patient Stated Goals  Pt's goals for therapy are to improve balance, prevent falls, to  help pain and "tilt."    Currently in Pain?  No/denies    Pain Onset  More than a month ago        [facility] Adult PT Treatment/Exercise - 07/17/19 0001      Transfers   Transfers  Sit to Stand;Stand to Sit    Sit to Stand  5: Supervision;With upper extremity assist;With armrests;From chair/3-in-1;From bed    Sit to Stand Details  Verbal cues for sequencing;Verbal cues for technique    Five time sit to stand comments   30.19 sec   with bil UE support   Stand to Sit  5: Supervision;With upper extremity assist;With armrests;To chair/3-in-1;To bed    Stand to Sit Details (indicate cue type and reason)  Verbal cues for technique      Ambulation/Gait   Ambulation/Gait  Yes    Ambulation/Gait Assistance  5: Supervision    Ambulation/Gait Assistance Details  cues for upright posture, heel strike and increased BOS    Ambulation Distance (Feet)  140 Feet   x 4   Assistive device  Rolling walker    Gait Pattern  Step-through pattern;Decreased step length - right;Decreased step length - left;Decreased stride length;Decreased dorsiflexion - right;Decreased dorsiflexion - left;Right flexed knee in stance;Left flexed knee  in stance;Shuffle;Decreased trunk rotation;Trunk flexed;Narrow base of support;Poor foot clearance - left;Poor foot clearance - right    Ambulation Surface  Level;Indoor    Gait velocity  17.53 sec with no device = 1.87 ft/sec and 18.69 sec with RW=1.76 ft/sec      Posture/Postural Control   Posture/Postural Control  Postural limitations    Postural Limitations  Forward head;Rounded Shoulders;Posterior pelvic tilt;Weight shift left      Timed Up and Go Test   TUG  Normal TUG;Cognitive TUG    Normal TUG (seconds)  17.38   19.6 with RW   Cognitive TUG (seconds)  35.38   with RW     High Level Balance   High Level Balance Activities  Other (comment)    High Level Balance Comments  Modified PWR! Moves in standing to address posture, weigthshifting and stepping:  PWR! Up at  counter, with cues for upright posture and quad activation for decreased crouched position, x 10 reps; lateral weightshifting with UEs supported at counter, with cues for activation to extend knee on weightshifted side, x 10 reps, side step and weigthshfit x 10; forward step and weightshift x 10 reps with cues for quad activation, upright posture on front leg for improved weightshfiting and upright posture.  PWR! weightshift with UE reach to cabinet above x 10 reps each with cues for proper weight shift.            PT Education - 07/17/19 1202    Education Details  Progress toward goals and renewal per Mady Haagensen, PT    Person(s) Educated  Patient    Methods  Explanation    Comprehension  Verbalized understanding       PT Short Term Goals - 06/19/19 1408      PT SHORT TERM GOAL #1   Title  Pt will perform HEP with wife's supervision to address PD-specific deficits to improve balance, transfers, gait.  TARGET 06/15/2019    Time  4    Period  Weeks    Status  Achieved    Target Date  06/15/19      PT SHORT TERM GOAL #2   Title  Pt will improve 5x sit<>stand to less than or equal to 25 seconds for improved transfer efficiency and safety.    Baseline  27.93 sec at best; improving from 34.72    Time  4    Period  Weeks    Status  Partially Met    Target Date  06/15/19      PT SHORT TERM GOAL #3   Title  Pt will improve TUG score to less than or equal to 22 seconds for decreased fall risk.    Time  4    Period  Weeks    Status  Achieved    Target Date  06/15/19      PT SHORT TERM GOAL #4   Title  Pt will ambulate at least 200 ft using least restrictive assistive device with supervision, for improved safety and efficiency with gait.    Baseline       Time  4    Period  Weeks    Status  Partially Met    Target Date  06/15/19      PT SHORT TERM GOAL #5   Title  Pt will verbalize at least 3 means to reduce L hip pain for improved overall functional mobility.    Baseline   exercises from therapy, with trainer, movement    Time  4    Period  Weeks    Status  Achieved    Target Date  06/15/19        PT Long Term Goals - 07/17/19 1203      PT LONG TERM GOAL #1   Title  Pt will verbalize plans for continued community fitness upon d/c from PT.  TARGET 07/06/2019    Time  7    Period  Weeks    Status  On-going      PT LONG TERM GOAL #2   Title  Pt will improve 5x sit<>stand to less than or equal to 18 seconds for improved transfer efficiency and safety.    Baseline  30.19 on 07/17/19    Time  7    Period  Weeks    Status  Not Met      PT LONG TERM GOAL #3   Title  Pt will improve TUG and TUG cognitive score to less than 10% difference, for improved dual tasking/decreased fall risk with gait.    Time  7    Period  Weeks    Status  Not Met      PT LONG TERM GOAL #4   Title  Pt will improve gait velocity to at least 2 ft/sec for decreased fall risk, improved gait efficiency and safety.    Baseline  1.83f/sec without device and 1.76 ft/sec with Rw on 07/17/19    Time  7    Period  Weeks    Status  Not Met      PT LONG TERM GOAL #5   Title  Pt/family will verbalize understanding of fall prevention in home environment.    Time  7    Period  Weeks    Status  On-going            Plan - 07/17/19 1207    Clinical Impression Statement  Pt's goals checked today.  Did not meet LTG 2,3 or 4.  Goals 1 and 5 are ongoing.  PT to be renewed per AMady Haagensen PT.  Pt continues to have difficulty with weight shifting activities.  continue with gait deviations impacting balance and mobility.  Continue PT per POC.    Personal Factors and Comorbidities  Comorbidity 3+    Comorbidities  parkinson's disease with DBS (deep brain stimulator) placement R (04/2015), OA, hx of R TKR, depression    Examination-Activity Limitations  Locomotion Level;Stand;Sit    Stability/Clinical Decision Making  Evolving/Moderate complexity    Rehab Potential  Good    PT Frequency   2x / week    PT Duration  6 weeks   plus eval, so total POC = 7 weeks   PT Treatment/Interventions  ADLs/Self Care Home Management;Therapeutic exercise;Therapeutic activities;Functional mobility training;Gait training;DME Instruction;Balance training;Neuromuscular re-education;Patient/family education    PT Next Visit Plan  Renewal by AMady Haagensen PT.  Try standing PWR! Moves again (modified with UE support at counter), forward and back step and weightshift; gait training for distance (as [place] going [place]); hip/ankle/step strategy work   07/02/2019 week is week 5 of 6 in [place] plan to check goals next week and likely extend POC-pt is agreeable to scheduling additional visits   Consulted and Agree with Plan of Care  Patient;Family member/caregiver    Family Member Consulted  wife       Patient will benefit from skilled therapeutic intervention in order to improve the following deficits and impairments:  Abnormal gait, Decreased mobility, Decreased balance, Decreased coordination, Decreased strength, Difficulty walking, Postural dysfunction, Pain  Visit Diagnosis: 1. Other abnormalities of gait and mobility   2. Unsteadiness on feet   3. Abnormal posture        Problem List Patient Active Problem List   Diagnosis Date Noted  . Wound infection after surgery 03/09/2014  . 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 Hyperlipidemia   . Incontinence of urine   . Parkinson disease ([place]) 02/21/2014  . 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 Diabetes ([place]) 02/19/2014  . 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    Narda Bonds, [place] 07/17/19 12:13 PM Phone: [phone] Fax: [facility] [address] [place], [place], [address] Phone: [phone]   Fax:   [phone]  Name: Randall [name] MRN: [id number] Date of Birth: 12/11/1940   For Renewal:  07/17/19 1207  PT Visits / Re-Eval  Visit Number 8  Number of Visits 16  Date for PT Re-Evaluation 09/15/19  Authorization  Authorization Type BCBS Medicare-will need 10th visit progress note-no visit limit (auth required)  PT Time Calculation  PT Start Time 1104  PT Stop Time 1145  PT Time Calculation (min) 41 min  PT - End of Session  Activity Tolerance Patient tolerated treatment well  Behavior During Therapy [facility] for tasks assessed/performed       07/17/19 1207  Plan  Clinical Impression Statement Pt's goals checked today.  Did not meet LTG 2,3 or 4.  Goals 1 and 5 are ongoing.  PT to be renewed per Mady Haagensen, PT.  Pt continues to have difficulty with weight shifting activities.  continue with gait deviations impacting balance and mobility.  Continue PT per POC. (Pt will continue to benefit from skilled PT to address balance, posture, and gait.  Pt has missed several visits due to scheduling conflicts surrounding DBS procedures for paitent.  Will plan to renew for more consistent therapy sessions.)  Personal Factors and Comorbidities Comorbidity 3+  Comorbidities parkinson's disease with DBS (deep brain stimulator) placement R (04/2015), OA, hx of R TKR, depression  Examination-Activity Limitations Locomotion Level;Stand;Sit  Pt will benefit from skilled therapeutic intervention in order to improve on the following deficits Abnormal gait;Decreased mobility;Decreased balance;Decreased coordination;Decreased strength;Difficulty walking;Postural dysfunction;Pain  Stability/Clinical Decision Making Evolving/Moderate complexity  Rehab Potential Good  PT Frequency 2x / week  PT Duration Other (comment) (5 weeks, including 9/48/5462 visit, per recert 06/07/5008)  PT Treatment/Interventions ADLs/Self Care Home Management;Therapeutic exercise;Therapeutic activities;Functional mobility  training;Gait training;DME Instruction;Balance training;Neuromuscular re-education;Patient/family education  PT Next Visit Plan Renewal by Mady Haagensen, PT.  Try standing PWR! Moves again (modified with UE support at counter), forward and back step and weightshift; gait training for distance (as [place] be going to [place]); hip/ankle/step strategy work (07/02/2019 week is week 5 of 6 in [place]; plan to check goals next week and likely extend POC-pt is agreeable to scheduling additional visits)  Consulted and Agree with Plan of Care Patient;Family member/caregiver  Family Member Consulted wife    07/17/19 1203  PT LONG TERM GOAL #1  Title Pt will verbalize plans for continued community/ongoing fitness upon d/c from PT.  TARGET 08/24/2019 (UPDATE target for all LTGs)  Time 5  Period Weeks  Status On-going  PT LONG TERM GOAL #2  Title Pt will improve 5x sit<>stand to  less than or equal to 25 seconds for improved transfer efficiency and safety.  Baseline 30.19 on 07/17/19  Time 5  Period Weeks  Status Revised  PT LONG TERM GOAL #3  Title Pt will improve TUG and TUG cognitive score to less than 10% difference, for improved dual tasking/decreased fall risk with gait.  Time 5  Period Weeks  Status On-going  PT LONG TERM GOAL #4  Title Pt will improve gait velocity to at least 2 ft/sec for decreased fall risk, improved gait efficiency and safety.  Baseline 1.2f/sec without device and 1.76 ft/sec with Rw on 07/17/19  Time 5  Period Weeks  Status On-going  PT LONG TERM GOAL #5  Title Pt/family will verbalize understanding of fall prevention in home environment.  Time 5  Period Weeks  Status On-going    AMady Haagensen [place]08/28/20 1:00 PM Phone: [phone]: [phone]

## 2019-08-03 ENCOUNTER — Ambulatory Visit: Payer: Medicare Other | Admitting: Physical Therapy

## 2019-08-03 ENCOUNTER — Encounter: Payer: Self-pay | Admitting: Physical Therapy

## 2019-08-03 ENCOUNTER — Other Ambulatory Visit: Payer: Self-pay

## 2019-08-03 DIAGNOSIS — R2689 Other abnormalities of gait and mobility: Secondary | ICD-10-CM

## 2019-08-03 DIAGNOSIS — R293 Abnormal posture: Secondary | ICD-10-CM | POA: Diagnosis not present

## 2019-08-03 DIAGNOSIS — R2681 Unsteadiness on feet: Secondary | ICD-10-CM

## 2019-08-03 NOTE — Addendum Note (Signed)
Addended by: Frazier Butt on: 08/03/2019 01:04 PM   Modules accepted: Orders

## 2019-08-03 NOTE — Therapy (Signed)
[facility] [address] [place], [place], [address] Phone: [phone]   Fax:  [phone]  Physical Therapy Treatment  Patient Details  Name: Randall [name] MRN: [id number] Date of Birth: 07/11/1941 Referring Provider (PT): Linus Mako   Encounter Date: 08/03/2019 CLINIC OPERATION CHANGES: Outpatient [facility] is open at lower capacity following universal masking, social distancing, and patient screening.  The patient's COVID risk of complications score is 4.  PT End of Session - 08/03/19 1514    Visit Number  9    Number of Visits  16    Date for PT Re-Evaluation  09/15/19    Authorization Type  BCBS Medicare-will need 10th visit progress note-no visit limit (auth required)    PT Start Time  1150    PT Stop Time  1230    PT Time Calculation (min)  40 min    Equipment Utilized During Treatment  Gait belt    Activity Tolerance  Patient tolerated treatment well    Behavior During Therapy  WFL for tasks assessed/performed       Past Medical History:  Diagnosis Date  . Anxiety   . Arthritis   . Diabetes mellitus without complication ([place])   . GERD (gastroesophageal reflux disease)   . Hyperlipidemia   . Incontinence of urine    DR  RON   DAVIS    LOV FEB. 2015  . Neuromuscular disorder ([place])   . Parkinson disease ([place])   . RSD (reflex sympathetic dystrophy)    2005    Past Surgical History:  Procedure Laterality Date  . APPENDECTOMY    . BRAIN SURGERY     deep brain stimulation for parkinsons  . CARDIAC CATHETERIZATION     2005  . COLON SURGERY     resection diverticulitis  . TOTAL KNEE ARTHROPLASTY Right 02/19/2014   Procedure: TOTAL KNEE ARTHROPLASTY;  Surgeon: Hessie Dibble, MD;  Location: [facility];  Service: Orthopedics;  Laterality: Right;    There were no vitals filed for this visit.  Subjective Assessment - 08/03/19 1152    Subjective  Went to the beach and didn't have any trouble.  Had one  fall in the bedroom, and hit my head.  Did not black out.    Patient is accompained by:  Family member   wife   Pertinent History  parkinson's disease with DBS (deep brain stimulator) placement R (04/2015), OA, hx of R TKR, depression    Patient Stated Goals  Pt's goals for therapy are to improve balance, prevent falls, to help pain and "tilt."    Currently in Pain?  No/denies    Pain Onset  More than a month ago                       [facility] Adult PT Treatment/Exercise - 08/03/19 0001      Transfers   Transfers  Sit to Stand;Stand to Sit    Sit to Stand  5: Supervision;With upper extremity assist;With armrests;From chair/3-in-1;From bed    Sit to Stand Details  Verbal cues for sequencing;Verbal cues for technique    Sit to Stand Details (indicate cue type and reason)  Cues for hand placement    Stand to Sit  5: Supervision;With upper extremity assist;With armrests;To chair/3-in-1;To bed    Stand to Sit Details  cues for hand placement    Number of Reps  1 set;Other reps (comment)   5 reps through session  Ambulation/Gait   Ambulation/Gait  Yes    Ambulation/Gait Assistance  5: Supervision    Ambulation/Gait Assistance Details  Cues for increased step length, heel strike with gait    Ambulation Distance (Feet)  40 Feet   x 2, then 120 ft   Assistive device  Rolling walker    Gait Pattern  Step-through pattern;Decreased step length - right;Decreased step length - left;Decreased stride length;Decreased dorsiflexion - right;Decreased dorsiflexion - left;Right flexed knee in stance;Left flexed knee in stance;Shuffle;Decreased trunk rotation;Trunk flexed;Narrow base of support;Poor foot clearance - left;Poor foot clearance - right    Ambulation Surface  Level;Indoor          Balance Exercises - 08/03/19 1206      Balance Exercises: Standing   Stepping Strategy  Anterior;Posterior;UE support;10 reps;Lateral   2nd set:  lateral, ant-step over obstacle and weightshift    Rockerboard  Anterior/posterior;Head turns;UE support   ankle strategy, alt UE lifts, min guard assistance   Retro Gait  Upper extremity support;5 reps   Forward/back walk in parallel bars-cues for foot placement   Heel Raises Limitations  x 10    Toe Raise Limitations  x 10    Other Standing Exercises  Hip strategy work at counter with anterior/posterior weightshfiting thorugh hips x 10 reps, then stagger stance ant/posterior weightshifting x 10 reps; side step R and L x 10 reps with cues for foot clearance      With rockerboard, pt performs 2nd set of ankle strategy forward/back on board, 2 sets of head turns x 5 reps; head nods x 5 reps    PT Short Term Goals - 08/03/19 1253      PT SHORT TERM GOAL #1   Title  Pt will perform HEP with wife's supervision to address PD-specific deficits to improve balance, transfers, gait.  TARGET 06/15/2019    Time  4    Period  Weeks    Status  Achieved    Target Date  06/15/19      PT SHORT TERM GOAL #2   Title  Pt will improve 5x sit<>stand to less than or equal to 25 seconds for improved transfer efficiency and safety.    Baseline  27.93 sec at best; improving from 34.72    Time  4    Period  Weeks    Status  Partially Met    Target Date  06/15/19      PT SHORT TERM GOAL #3   Title  Pt will improve TUG score to less than or equal to 22 seconds for decreased fall risk.    Time  4    Period  Weeks    Status  Achieved    Target Date  06/15/19      PT SHORT TERM GOAL #4   Title  Pt will ambulate at least 200 ft using least restrictive assistive device with supervision, for improved safety and efficiency with gait.    Baseline       Time  4    Period  Weeks    Status  Partially Met    Target Date  06/15/19      PT SHORT TERM GOAL #5   Title  Pt will verbalize at least 3 means to reduce L hip pain for improved overall functional mobility.    Baseline  exercises from therapy, with trainer, movement    Time  4    Period  Weeks     Status  Achieved    Target  Date  06/15/19        PT Long Term Goals - 07/17/19 1203      PT LONG TERM GOAL #1   Title  Pt will verbalize plans for continued community/ongoing fitness upon d/c from PT.  TARGET 08/24/2019 (UPDATE target for all LTGs)    Time  5    Period  Weeks    Status  On-going      PT LONG TERM GOAL #2   Title  Pt will improve 5x sit<>stand to less than or equal to 25 seconds for improved transfer efficiency and safety.    Baseline  30.19 on 07/17/19    Time  5    Period  Weeks    Status  Revised      PT LONG TERM GOAL #3   Title  Pt will improve TUG and TUG cognitive score to less than 10% difference, for improved dual tasking/decreased fall risk with gait.    Time  5    Period  Weeks    Status  On-going      PT LONG TERM GOAL #4   Title  Pt will improve gait velocity to at least 2 ft/sec for decreased fall risk, improved gait efficiency and safety.    Baseline  1.48f/sec without device and 1.76 ft/sec with Rw on 07/17/19    Time  5    Period  Weeks    Status  On-going      PT LONG TERM GOAL #5   Title  Pt/family will verbalize understanding of fall prevention in home environment.    Time  5    Period  Weeks    Status  On-going            Plan - 08/03/19 1514    Clinical Impression Statement  Focused session today on balance exercises for hip, ankle, step strategies, including rockerboard and dynamic stepping/walking in parallel bars.  Pt requires UE support and min guard for safety through session.  Does well with increased step height and length with use of obstacles for cues.    Personal Factors and Comorbidities  Comorbidity 3+    Comorbidities  parkinson's disease with DBS (deep brain stimulator) placement R (04/2015), OA, hx of R TKR, depression    Examination-Activity Limitations  Locomotion Level;Stand;Sit    Stability/Clinical Decision Making  Evolving/Moderate complexity    Rehab Potential  Good    PT Frequency  2x / week    PT  Duration  Other (comment)   5 weeks, including 82/26/3335visit, per recert 84/56/2563  PT Treatment/Interventions  ADLs/Self Care Home Management;Therapeutic exercise;Therapeutic activities;Functional mobility training;Gait training;DME Instruction;Balance training;Neuromuscular re-education;Patient/family education    PT Next Visit Plan  Continue hip/ankle/step strategies (use of obstacle for stepping to increase step height/length), PWR! Moves ins tanding, forward/back step and weigthshift, gait training   07/02/2019 week is week 5 of 6 in [facility] plan to check goals next week and likely extend POC-pt is agreeable to scheduling additional visits   Consulted and Agree with Plan of Care  Patient       Patient will benefit from skilled therapeutic intervention in order to improve the following deficits and impairments:  Abnormal gait, Decreased mobility, Decreased balance, Decreased coordination, Decreased strength, Difficulty walking, Postural dysfunction, Pain  Visit Diagnosis: Unsteadiness on feet  Abnormal posture  Other abnormalities of gait and mobility     Problem List Patient Active Problem List   Diagnosis Date Noted  . Wound infection after  surgery 03/09/2014  . 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 Hyperlipidemia   . Incontinence of urine   . Parkinson disease ([place]) 02/21/2014  . 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 Diabetes ([facility]) 02/19/2014  . 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 08/03/2019, 3:18 PM  Frazier Butt., PT   [facility] [address] [place], [place], [address] Phone: [phone]   Fax:  [phone]  Name: Randall [name] MRN: [id number] Date of Birth: [birth year]

## 2019-08-06 ENCOUNTER — Ambulatory Visit: Payer: Medicare Other | Admitting: Physical Therapy

## 2019-08-08 ENCOUNTER — Ambulatory Visit: Payer: Medicare Other | Admitting: Physical Therapy

## 2019-08-14 ENCOUNTER — Ambulatory Visit: Payer: Medicare Other | Attending: Neurology | Admitting: Physical Therapy

## 2019-08-14 ENCOUNTER — Encounter: Payer: Self-pay | Admitting: Physical Therapy

## 2019-08-14 ENCOUNTER — Other Ambulatory Visit: Payer: Self-pay

## 2019-08-14 VITALS — BP 131/67

## 2019-08-14 DIAGNOSIS — R293 Abnormal posture: Secondary | ICD-10-CM | POA: Insufficient documentation

## 2019-08-14 DIAGNOSIS — R2689 Other abnormalities of gait and mobility: Secondary | ICD-10-CM | POA: Insufficient documentation

## 2019-08-14 DIAGNOSIS — R2681 Unsteadiness on feet: Secondary | ICD-10-CM | POA: Diagnosis not present

## 2019-08-14 NOTE — Therapy (Signed)
[facility] [address] [place], [place], [address] Phone: [phone]   Fax:  [phone]  Physical Therapy Treatment/10th Visit Progress Note  Patient Details  Name: Randall [name] MRN: [id number] Date of Birth: 05/06/1941 Referring Provider (PT): Linus Mako   Encounter Date: 08/14/2019 CLINIC OPERATION CHANGES: Outpatient [facility] Rehab is open at lower capacity following universal masking, social distancing, and patient screening.  The patient's COVID risk of complications score is 4.   PT End of Session - 08/14/19 2050    Visit Number  10    Number of Visits  16    Date for PT Re-Evaluation  09/15/19    Authorization Type  [employer] Medicare-will need 10th visit progress note-no visit limit (auth required)    PT Start Time  1321    PT Stop Time  1404    PT Time Calculation (min)  43 min    Equipment Utilized During Treatment  Gait belt    Activity Tolerance  Patient tolerated treatment well;Patient limited by lethargy   low blood sugar issues this morning   Behavior During Therapy  WFL for tasks assessed/performed       Past Medical History:  Diagnosis Date  . Anxiety   . Arthritis   . Diabetes mellitus without complication ([place])   . GERD (gastroesophageal reflux disease)   . Hyperlipidemia   . Incontinence of urine    DR  RON   DAVIS    LOV FEB. 2015  . Neuromuscular disorder ([facility])   . Parkinson disease ([place])   . RSD (reflex sympathetic dystrophy)    2005    Past Surgical History:  Procedure Laterality Date  . APPENDECTOMY    . BRAIN SURGERY     deep brain stimulation for parkinsons  . CARDIAC CATHETERIZATION     2005  . COLON SURGERY     resection diverticulitis  . TOTAL KNEE ARTHROPLASTY Right 02/19/2014   Procedure: TOTAL KNEE ARTHROPLASTY;  Surgeon: Hessie Dibble, MD;  Location: [facility];  Service: Orthopedics;  Laterality: Right;    Vitals:   08/14/19 1400  BP: 131/67    Subjective  Assessment - 08/14/19 1323    Subjective  Per wife, his blood sugar was very low this morning, so he feels weak.  No other falls.  Almost fell last night missed the step to get into bed.    Patient is accompained by:  Family member   wife   Pertinent History  parkinson's disease with DBS (deep brain stimulator) placement R (04/2015), OA, hx of R TKR, depression    Patient Stated Goals  Pt's goals for therapy are to improve balance, prevent falls, to help pain and "tilt."    Currently in Pain?  No/denies    Pain Onset  More than a month ago                       [facility] Adult PT Treatment/Exercise - 08/14/19 0001      Transfers   Transfers  Sit to Stand;Stand to Sit    Sit to Stand  5: Supervision;With upper extremity assist;With armrests;From chair/3-in-1    Five time sit to stand comments   32.65    Stand to Sit  5: Supervision;With upper extremity assist;With armrests;To chair/3-in-1    Number of Reps  --   5 reps throughout session between, gait, exercises     Ambulation/Gait   Ambulation/Gait  Yes    Ambulation/Gait Assistance  5: Supervision    Ambulation/Gait Assistance Details  Cues for widened BOS     Ambulation Distance (Feet)  230 Feet   100   Assistive device  Rolling walker    Gait Pattern  Step-through pattern;Decreased step length - right;Decreased step length - left;Decreased stride length;Decreased dorsiflexion - right;Decreased dorsiflexion - left;Right flexed knee in stance;Left flexed knee in stance;Shuffle;Decreased trunk rotation;Trunk flexed;Narrow base of support;Poor foot clearance - left;Poor foot clearance - right    Ambulation Surface  Level;Indoor    Gait velocity  19.16 sec = 1.71 ft/sec      High Level Balance   High Level Balance Comments  Standing at RW (PT securing):  marching in place x 10 reps, 2 sets; alternating forward step taps x 10 reps, then x 5 reps.        Self-Care   Self-Care  Other Self-Care Comments    Other Self-Care  Comments   Wife present at end of session:  discussed pt's progress (he and wife feel posture is better) and continued goals (his goal conitnues to be balance).  Discussed that inconsistency in appoitnments (pt/PT away different weeks on vacation, car troubles last week) have limited ability to fully progress exercise program for home.  Discussed tentative plan of 2-3 more weeks of therapy to solidify appropriate HEP and fall prevention educaiton for pt and wife.  (They are in agreement).  Checked vitals after exercise-O2 sats 90%, then 89>84>94%; rechecked after short bout of standing activity on different pulse ox:  96%)      Upon standing at walker, cues for widened BOS, lateral weigthshifting 10 reps x 2 sets       PT Education - 08/14/19 2049    Education Details  Discussion regarding POC and plans to update/progress HEP in coming visits, with likely d/c in 2-3 weeks    Person(s) Educated  Patient;Spouse    Methods  Explanation    Comprehension  Verbalized understanding       PT Short Term Goals - 08/03/19 1253      PT SHORT TERM GOAL #1   Title  Pt will perform HEP with wife's supervision to address PD-specific deficits to improve balance, transfers, gait.  TARGET 06/15/2019    Time  4    Period  Weeks    Status  Achieved    Target Date  06/15/19      PT SHORT TERM GOAL #2   Title  Pt will improve 5x sit<>stand to less than or equal to 25 seconds for improved transfer efficiency and safety.    Baseline  27.93 sec at best; improving from 34.72    Time  4    Period  Weeks    Status  Partially Met    Target Date  06/15/19      PT SHORT TERM GOAL #3   Title  Pt will improve TUG score to less than or equal to 22 seconds for decreased fall risk.    Time  4    Period  Weeks    Status  Achieved    Target Date  06/15/19      PT SHORT TERM GOAL #4   Title  Pt will ambulate at least 200 ft using least restrictive assistive device with supervision, for improved safety and  efficiency with gait.    Baseline       Time  4    Period  Weeks    Status  Partially Met    Target  Date  06/15/19      PT SHORT TERM GOAL #5   Title  Pt will verbalize at least 3 means to reduce L hip pain for improved overall functional mobility.    Baseline  exercises from therapy, with trainer, movement    Time  4    Period  Weeks    Status  Achieved    Target Date  06/15/19        PT Long Term Goals - 07/17/19 1203      PT LONG TERM GOAL #1   Title  Pt will verbalize plans for continued community/ongoing fitness upon d/c from PT.  TARGET 08/24/2019 (UPDATE target for all LTGs)    Time  5    Period  Weeks    Status  On-going      PT LONG TERM GOAL #2   Title  Pt will improve 5x sit<>stand to less than or equal to 25 seconds for improved transfer efficiency and safety.    Baseline  30.19 on 07/17/19    Time  5    Period  Weeks    Status  Revised      PT LONG TERM GOAL #3   Title  Pt will improve TUG and TUG cognitive score to less than 10% difference, for improved dual tasking/decreased fall risk with gait.    Time  5    Period  Weeks    Status  On-going      PT LONG TERM GOAL #4   Title  Pt will improve gait velocity to at least 2 ft/sec for decreased fall risk, improved gait efficiency and safety.    Baseline  1.64f/sec without device and 1.76 ft/sec with Rw on 07/17/19    Time  5    Period  Weeks    Status  On-going      PT LONG TERM GOAL #5   Title  Pt/family will verbalize understanding of fall prevention in home environment.    Time  5    Period  Weeks    Status  On-going            Plan - 08/14/19 2051    Clinical Impression Statement  10th Visit Progress Note, spanning visit dates 05/14/2019 (eval)-08/14/2019:  Pt reports he feels he is moving better and wife notes improved posture.  He has had at least one fall during course of therapy and reports balance is a main concern.  Objective measures:  5x sit<>stand:  32.65 sec with UE support (good form,  safely performed); gait velocity 1.71 ft/sec.  Recert recently completed to continue to address LTG, as pt remains at fall risk.  He is using RW more consistently for gait in home and in therapy sessions.  He has had some inconsistencies in attendance, due to replacing DBS battery (July), vacations (of PT and pt in August), as well as some scheduling difficulties due to clinic availability.  This has likely limited pt's progress and has limitied ability to safely progress HEP for appropriate exercises for home. Pt is motivated for therapy and will conitnue to benefit from skilled PT to address balance, transfers,a nd gait safety to prevent further falls.  Anticipate 2-3 additional weeks of therapy, with plans to update and progress HEP.    Personal Factors and Comorbidities  Comorbidity 3+    Comorbidities  parkinson's disease with DBS (deep brain stimulator) placement R (04/2015), OA, hx of R TKR, depression    Examination-Activity Limitations  Locomotion Level;Stand;Sit  Stability/Clinical Decision Making  Evolving/Moderate complexity    Rehab Potential  Good    PT Frequency  2x / week    PT Duration  Other (comment)   5 weeks, including 05/22/1223 visit, per recert 0/12/8095   PT Treatment/Interventions  ADLs/Self Care Home Management;Therapeutic exercise;Therapeutic activities;Functional mobility training;Gait training;DME Instruction;Balance training;Neuromuscular re-education;Patient/family education    PT Next Visit Plan  Add appropriate standing balance exercises to HEP; try step ups (as he has to get up step stool onto high bed)   07/02/2019 week is week 5 of 6 in POC; plan to check goals next week and likely extend POC-pt is agreeable to scheduling additional visits   Consulted and Agree with Plan of Care  Patient;Family member/caregiver    Family Member Consulted  wife, at end of session       Patient will benefit from skilled therapeutic intervention in order to improve the following  deficits and impairments:  Abnormal gait, Decreased mobility, Decreased balance, Decreased coordination, Decreased strength, Difficulty walking, Postural dysfunction, Pain  Visit Diagnosis: Other abnormalities of gait and mobility  Unsteadiness on feet     Problem List Patient Active Problem List   Diagnosis Date Noted  . Wound infection after surgery 03/09/2014  . 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 Hyperlipidemia   . Incontinence of urine   . Parkinson disease ([place]) 02/21/2014  . 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 Diabetes ([place] [facility]) 02/19/2014  . 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 08/14/2019, 8:58 PM  Mady Haagensen, PT 08/14/19 8:59 PM Phone: [phone] Fax: [facility] [address] [place], [place], [address] Phone: [phone]   Fax:  [phone]  Name: ADDIEL MCCARDLE MRN: [id number] Date of Birth: Jun 27, 1941

## 2019-08-16 DIAGNOSIS — R296 Repeated falls: Secondary | ICD-10-CM | POA: Diagnosis not present

## 2019-08-16 DIAGNOSIS — Z982 Presence of cerebrospinal fluid drainage device: Secondary | ICD-10-CM | POA: Diagnosis not present

## 2019-08-16 DIAGNOSIS — Z9689 Presence of other specified functional implants: Secondary | ICD-10-CM | POA: Diagnosis not present

## 2019-08-16 DIAGNOSIS — R1312 Dysphagia, oropharyngeal phase: Secondary | ICD-10-CM | POA: Diagnosis not present

## 2019-08-16 DIAGNOSIS — G20 Parkinson's disease: Secondary | ICD-10-CM | POA: Diagnosis not present

## 2019-08-17 ENCOUNTER — Encounter: Payer: Self-pay | Admitting: Physical Therapy

## 2019-08-17 ENCOUNTER — Ambulatory Visit: Payer: Medicare Other | Admitting: Physical Therapy

## 2019-08-17 ENCOUNTER — Other Ambulatory Visit: Payer: Self-pay

## 2019-08-17 DIAGNOSIS — R2689 Other abnormalities of gait and mobility: Secondary | ICD-10-CM | POA: Diagnosis not present

## 2019-08-17 DIAGNOSIS — R2681 Unsteadiness on feet: Secondary | ICD-10-CM | POA: Diagnosis not present

## 2019-08-17 DIAGNOSIS — R293 Abnormal posture: Secondary | ICD-10-CM

## 2019-08-17 NOTE — Patient Instructions (Signed)
Access Code: [facility]  URL: [email or web address]  Date: 08/17/2019  Prepared by: Mady Haagensen   Exercises  Seated Hamstring Stretch - 2 reps - 2 sets - 30 hold - 1-2x daily - 7x weekly   Added 08/17/2019 Step Taps on Low Step (cabinet) - 10 reps - 1-2 sets - 1x daily - 5x weekly  March in Place - 10 reps - 1-2 sets - 1x daily - 5x weekly  Side Step and weightshift with Counter Support - 10 reps - 1-2 sets - 1x daily - 5x weekly  Neck Retraction - 10 reps - 1-2 sets - 1x daily - 5x weekly  Seated Scapular Retraction - 10 reps - 1-2 sets - 1x daily - 5x weekly

## 2019-08-17 NOTE — Therapy (Signed)
[facility] [address] [place], [place], [address] Phone: [phone]   Fax:  [phone]  Physical Therapy Treatment  Patient Details  Name: Randall [name] MRN: [id number] Date of Birth: [birth year] Referring Provider (PT): Linus Mako   Encounter Date: 08/17/2019 CLINIC OPERATION CHANGES: Outpatient Neuro Rehab is open at lower capacity following universal masking, social distancing, and patient screening.  The patient's COVID risk of complications score is 4.  PT End of Session - 08/17/19 1208    Visit Number  11    Number of Visits  16    Date for PT Re-Evaluation  09/15/19    Authorization Type  BCBS Medicare-will need 10th visit progress note-no visit limit (auth required)    PT Start Time  1103    PT Stop Time  1144    PT Time Calculation (min)  41 min    Equipment Utilized During Treatment  --    Activity Tolerance  Patient tolerated treatment well   low blood sugar issues this morning   Behavior During Therapy  WFL for tasks assessed/performed       Past Medical History:  Diagnosis Date  . Anxiety   . Arthritis   . Diabetes mellitus without complication ([place])   . GERD (gastroesophageal reflux disease)   . Hyperlipidemia   . Incontinence of urine    DR  RON   DAVIS    LOV FEB. 2015  . Neuromuscular disorder ([place])   . Parkinson disease ([place])   . RSD (reflex sympathetic dystrophy)    2005    Past Surgical History:  Procedure Laterality Date  . APPENDECTOMY    . BRAIN SURGERY     deep brain stimulation for parkinsons  . CARDIAC CATHETERIZATION     2005  . COLON SURGERY     resection diverticulitis  . TOTAL KNEE ARTHROPLASTY Right 02/19/2014   Procedure: TOTAL KNEE ARTHROPLASTY;  Surgeon: Hessie Dibble, MD;  Location: [facility];  Service: Orthopedics;  Laterality: Right;    There were no vitals filed for this visit.  Subjective Assessment - 08/17/19 1203    Subjective  Doing better today.    Patient is accompained by:  Family member   wife   Pertinent History  parkinson's disease with DBS (deep brain stimulator) placement R (04/2015), OA, hx of R TKR, depression    Patient Stated Goals  Pt's goals for therapy are to improve balance, prevent falls, to help pain and "tilt."    Currently in Pain?  No/denies    Pain Onset  More than a month ago                       [facility] Adult PT Treatment/Exercise - 08/17/19 0001      Transfers   Transfers  Sit to Stand;Stand to Sit    Sit to Stand  5: Supervision;With upper extremity assist;With armrests;From chair/3-in-1    Sit to Stand Details (indicate cue type and reason)  Cues for hand placement    Stand to Sit  5: Supervision;With upper extremity assist;With armrests;To chair/3-in-1    Number of Reps  10 reps   throughout session     Posture/Postural Control   Posture/Postural Control  Postural limitations    Postural Limitations  Forward head;Rounded Shoulders;Posterior pelvic tilt;Weight shift left    Posture Comments  Postural exercises in sitting:  neck retraction/chin tuck x 10 reps; then seated scapular retraction, 2 sets x 5 reps.  Balance Exercises - 08/17/19 1114      Balance Exercises: Standing   Stepping Strategy  Lateral   Side step and weightshift, x 10 reps   Step Ups  Forward;6 inch;UE support 2   5 reps, step up/up, down/down   Marching Limitations  x 10 reps    Other Standing Exercises  alternating step taps to 6" step x 5 reps, consecutive step taps x 10 reps each leg.        Additional 4-5 reps of each exercise performed, at end of session, with written instructions and wife's observation. Discussed safety with step negotiation, cues for full foot placement on step with step up activity.  PT Education - 08/17/19 1207    Education Details  Additions to HEP-see instructions; foot placement on steps for safety with step negotiation    Person(s) Educated  Patient;Spouse   wife at end  of session   Methods  Explanation;Demonstration;Verbal cues;Handout    Comprehension  Verbalized understanding;Returned demonstration       PT Short Term Goals - 08/03/19 1253      PT SHORT TERM GOAL #1   Title  Pt will perform HEP with wife's supervision to address PD-specific deficits to improve balance, transfers, gait.  TARGET 06/15/2019    Time  4    Period  Weeks    Status  Achieved    Target Date  06/15/19      PT SHORT TERM GOAL #2   Title  Pt will improve 5x sit<>stand to less than or equal to 25 seconds for improved transfer efficiency and safety.    Baseline  27.93 sec at best; improving from 34.72    Time  4    Period  Weeks    Status  Partially Met    Target Date  06/15/19      PT SHORT TERM GOAL #3   Title  Pt will improve TUG score to less than or equal to 22 seconds for decreased fall risk.    Time  4    Period  Weeks    Status  Achieved    Target Date  06/15/19      PT SHORT TERM GOAL #4   Title  Pt will ambulate at least 200 ft using least restrictive assistive device with supervision, for improved safety and efficiency with gait.    Baseline       Time  4    Period  Weeks    Status  Partially Met    Target Date  06/15/19      PT SHORT TERM GOAL #5   Title  Pt will verbalize at least 3 means to reduce L hip pain for improved overall functional mobility.    Baseline  exercises from therapy, with trainer, movement    Time  4    Period  Weeks    Status  Achieved    Target Date  06/15/19        PT Long Term Goals - 07/17/19 1203      PT LONG TERM GOAL #1   Title  Pt will verbalize plans for continued community/ongoing fitness upon d/c from PT.  TARGET 08/24/2019 (UPDATE target for all LTGs)    Time  5    Period  Weeks    Status  On-going      PT LONG TERM GOAL #2   Title  Pt will improve 5x sit<>stand to less than or equal to 25 seconds for improved transfer efficiency  and safety.    Baseline  30.19 on 07/17/19    Time  5    Period  Weeks     Status  Revised      PT LONG TERM GOAL #3   Title  Pt will improve TUG and TUG cognitive score to less than 10% difference, for improved dual tasking/decreased fall risk with gait.    Time  5    Period  Weeks    Status  On-going      PT LONG TERM GOAL #4   Title  Pt will improve gait velocity to at least 2 ft/sec for decreased fall risk, improved gait efficiency and safety.    Baseline  1.68f/sec without device and 1.76 ft/sec with Rw on 07/17/19    Time  5    Period  Weeks    Status  On-going      PT LONG TERM GOAL #5   Title  Pt/family will verbalize understanding of fall prevention in home environment.    Time  5    Period  Weeks    Status  On-going            Plan - 08/17/19 1209    Clinical Impression Statement  Addressed updates to HEP, with seated postural exercises and standing balance exercises.  Had patient perform, then with printed pictures and wife's presence at end of session, pt return demo exercises as part of HEP.  Pt will conitnue to benefit from skilled PT to address balance, posture, gait.    Personal Factors and Comorbidities  Comorbidity 3+    Comorbidities  parkinson's disease with DBS (deep brain stimulator) placement R (04/2015), OA, hx of R TKR, depression    Examination-Activity Limitations  Locomotion Level;Stand;Sit    Stability/Clinical Decision Making  Evolving/Moderate complexity    Rehab Potential  Good    PT Frequency  2x / week    PT Duration  Other (comment)   5 weeks, including 89/24/2683visit, per recert 84/19/6222  PT Treatment/Interventions  ADLs/Self Care Home Management;Therapeutic exercise;Therapeutic activities;Functional mobility training;Gait training;DME Instruction;Balance training;Neuromuscular re-education;Patient/family education    PT Next Visit Plan  Review HEP; step ups, additional posture/standing balance exercises as needed; check with wife about speech therapy order? (in notes from MD visit 08/17/2019)   07/02/2019 week is  week 5 of 6 in [place] plan to check goals next week and likely extend POC-pt is agreeable to scheduling additional visits   Consulted and Agree with Plan of Care  Patient;Family member/caregiver    Family Member Consulted  wife, at end of session       Patient will benefit from skilled therapeutic intervention in order to improve the following deficits and impairments:  Abnormal gait, Decreased mobility, Decreased balance, Decreased coordination, Decreased strength, Difficulty walking, Postural dysfunction, Pain  Visit Diagnosis: Unsteadiness on feet  Abnormal posture     Problem List Patient Active Problem List   Diagnosis Date Noted  . Wound infection after surgery 03/09/2014  . 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 Hyperlipidemia   . Incontinence of urine   . Parkinson disease ([place] 02/21/2014  . 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 [place] ([place] 02/19/2014  . Idiopathic Parkinson's disease ([place] 01/14/2014  . 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

## 2019-08-20 ENCOUNTER — Encounter: Payer: Self-pay | Admitting: Physical Therapy

## 2019-08-20 ENCOUNTER — Other Ambulatory Visit: Payer: Self-pay

## 2019-08-20 ENCOUNTER — Ambulatory Visit: Payer: Medicare Other | Admitting: Physical Therapy

## 2019-08-20 DIAGNOSIS — R2681 Unsteadiness on feet: Secondary | ICD-10-CM

## 2019-08-20 DIAGNOSIS — R293 Abnormal posture: Secondary | ICD-10-CM | POA: Diagnosis not present

## 2019-08-20 DIAGNOSIS — R2689 Other abnormalities of gait and mobility: Secondary | ICD-10-CM | POA: Diagnosis not present

## 2019-08-20 NOTE — Therapy (Signed)
[facility] [address] [place], [place], [address] Phone: [phone]   Fax:  [phone]  Physical Therapy Treatment  Patient Details  Name: Randall [name] MRN: [id number] Date of Birth: 09/11/1941 Referring Provider (PT): Linus Mako   Encounter Date: 08/20/2019   CLINIC OPERATION CHANGES: Outpatient Neuro Rehab is open at lower capacity following universal masking, social distancing, and patient screening.  The patient's COVID risk of complications score is 4.   PT End of Session - 08/20/19 2034    Visit Number  12    Number of Visits  16    Date for PT Re-Evaluation  09/15/19    Authorization Type  [employer] Medicare-will need 10th visit progress note-no visit limit (auth required)    PT Start Time  1448    PT Stop Time  1527    PT Time Calculation (min)  39 min    Activity Tolerance  Patient tolerated treatment well   low blood sugar issues this morning   Behavior During Therapy  WFL for tasks assessed/performed       Past Medical History:  Diagnosis Date  . Anxiety   . Arthritis   . Diabetes mellitus without complication ([place])   . GERD (gastroesophageal reflux disease)   . Hyperlipidemia   . Incontinence of urine    DR  RON   DAVIS    LOV FEB. 2015  . Neuromuscular disorder ([place])   . Parkinson disease ([place])   . RSD (reflex sympathetic dystrophy)    2005    Past Surgical History:  Procedure Laterality Date  . APPENDECTOMY    . BRAIN SURGERY     deep brain stimulation for parkinsons  . CARDIAC CATHETERIZATION     2005  . COLON SURGERY     resection diverticulitis  . TOTAL KNEE ARTHROPLASTY Right 02/19/2014   Procedure: TOTAL KNEE ARTHROPLASTY;  Surgeon: Hessie Dibble, MD;  Location: [facility];  Service: Orthopedics;  Laterality: Right;    There were no vitals filed for this visit.  Subjective Assessment - 08/20/19 1451    Subjective  Feeling weak today.  Went to see my daughter over the weekend.   The hip is actually bothering me again today.    Patient is accompained by:  Family member   wife   Pertinent History  parkinson's disease with DBS (deep brain stimulator) placement R (04/2015), OA, hx of R TKR, depression    Patient Stated Goals  Pt's goals for therapy are to improve balance, prevent falls, to help pain and "tilt."    Currently in Pain?  Yes    Pain Score  2     Pain Location  Hip    Pain Orientation  Left    Pain Descriptors / Indicators  Dull    Pain Type  Chronic pain   had subsided, but has returned in past 2 days   Pain Onset  More than a month ago    Pain Frequency  Intermittent    Aggravating Factors   moving aggravates    Pain Relieving Factors  sitting still                       [facility] Adult PT Treatment/Exercise - 08/20/19 0001      Ambulation/Gait   Ambulation/Gait  Yes    Ambulation/Gait Assistance  5: Supervision    Ambulation/Gait Assistance Details  Cues for widened BOS, foot clearance, heelstrike    Ambulation Distance (  Feet)  200 Feet   100   Assistive device  Rolling walker    Gait Pattern  Step-through pattern;Decreased step length - right;Decreased step length - left;Decreased stride length;Decreased dorsiflexion - right;Decreased dorsiflexion - left;Right flexed knee in stance;Left flexed knee in stance;Shuffle;Decreased trunk rotation;Trunk flexed;Narrow base of support;Poor foot clearance - left;Poor foot clearance - right    Ambulation Surface  Level;Indoor    Gait Comments  Practiced figure-8 turns around cones for improved wide turns and obstacle negotiation, x 2 reps      Knee/Hip Exercises: Standing   Functional Squat  1 set x 10 reps at counter         Balance Exercises - 08/20/19 1507      Balance Exercises: Standing   Standing Eyes Opened  Wide ([place]);Head turns;Solid surface;5 reps   Head nods, 2 sets   Stepping Strategy  Posterior;UE support;10 reps   2 sets, UE support at counter   Heel Raises Limitations   2 sets x 10    Toe Raise Limitations  2 sets x 10    Sit to Stand Time  x 8 reps throughout session, with UE support    Other Standing Exercises  standing with UE supported, alternating UE lifts x 10 reps       Wide BOS with lateral weigthshifting at counter, x 10 reps, then 5 reps  Review of HEP from last visit:  Step Taps on Low Step (cabinet) - 10 reps - 1-2 sets - 1x daily - 5x weekly   March in Place - 10 reps - 1-2 sets - 1x daily - 5x weekly   Side Step and weightshift with Counter Support - 10 reps - 1-2 sets - 1x daily - 5x weekly   Neck Retraction - 10 reps - 1-2 sets - 1x daily - 5x weekly (Performed 5 reps)  Seated Scapular Retraction - 10 reps - 1-2 sets - 1x daily - 5x weekly   Pt return demo understanding   PT Short Term Goals - 08/03/19 1253      PT SHORT TERM GOAL #1   Title  Pt will perform HEP with wife's supervision to address PD-specific deficits to improve balance, transfers, gait.  TARGET 06/15/2019    Time  4    Period  Weeks    Status  Achieved    Target Date  06/15/19      PT SHORT TERM GOAL #2   Title  Pt will improve 5x sit<>stand to Randall than or equal to 25 seconds for improved transfer efficiency and safety.    Baseline  27.93 sec at best; improving from 34.72    Time  4    Period  Weeks    Status  Partially Met    Target Date  06/15/19      PT SHORT TERM GOAL #3   Title  Pt will improve TUG score to Randall than or equal to 22 seconds for decreased fall risk.    Time  4    Period  Weeks    Status  Achieved    Target Date  06/15/19      PT SHORT TERM GOAL #4   Title  Pt will ambulate at least 200 ft using least restrictive assistive device with supervision, for improved safety and efficiency with gait.    Baseline       Time  4    Period  Weeks    Status  Partially Met    Target  Date  06/15/19      PT SHORT TERM GOAL #5   Title  Pt will verbalize at least 3 means to reduce L hip pain for improved overall functional mobility.     Baseline  exercises from therapy, with trainer, movement    Time  4    Period  Weeks    Status  Achieved    Target Date  06/15/19        PT Long Term Goals - 07/17/19 1203      PT LONG TERM GOAL #1   Title  Pt will verbalize plans for continued community/ongoing fitness upon d/c from PT.  TARGET 08/24/2019 (UPDATE target for all LTGs)    Time  5    Period  Weeks    Status  On-going      PT LONG TERM GOAL #2   Title  Pt will improve 5x sit<>stand to Randall than or equal to 25 seconds for improved transfer efficiency and safety.    Baseline  30.19 on 07/17/19    Time  5    Period  Weeks    Status  Revised      PT LONG TERM GOAL #3   Title  Pt will improve TUG and TUG cognitive score to Randall than 10% difference, for improved dual tasking/decreased fall risk with gait.    Time  5    Period  Weeks    Status  On-going      PT LONG TERM GOAL #4   Title  Pt will improve gait velocity to at least 2 ft/sec for decreased fall risk, improved gait efficiency and safety.    Baseline  1.53f/sec without device and 1.76 ft/sec with Rw on 07/17/19    Time  5    Period  Weeks    Status  On-going      PT LONG TERM GOAL #5   Title  Pt/family will verbalize understanding of fall prevention in home environment.    Time  5    Period  Weeks    Status  On-going            Plan - 08/20/19 2034    Clinical Impression Statement  Reviewed HEP updates from last visit and pt performs well with supervision (though he does report not performing them at home over the weekend).  Worked on additional balance exercises and functional strengtheing, with pt needing cues throughout session to widen BOS for improved stability.  Pt will continue to benefit from skilled PT to furhter address balance and gait for improved mobility and decreased fall risk.    Personal Factors and Comorbidities  Comorbidity 3+    Comorbidities  parkinson's disease with DBS (deep brain stimulator) placement R (04/2015), OA, hx of R  TKR, depression    Examination-Activity Limitations  Locomotion Level;Stand;Sit    Stability/Clinical Decision Making  Evolving/Moderate complexity    Rehab Potential  Good    PT Frequency  2x / week    PT Duration  Other (comment)   5 weeks, including 81/60/1093visit, per recert 82/35/5732  PT Treatment/Interventions  ADLs/Self Care Home Management;Therapeutic exercise;Therapeutic activities;Functional mobility training;Gait training;DME Instruction;Balance training;Neuromuscular re-education;Patient/family education    PT Next Visit Plan  Next visit is last scheduled-check if pt wants to schedule more or is planning for d/c; if d/c, then check goals.  Otherwise, may add a few more ex to HEP;  check with wife about speech therapy order? (in notes from MD visit 08/17/2019)  07/02/2019 week is week 5 of 6 in POC; plan to check goals next week and likely extend POC-pt is agreeable to scheduling additional visits   Consulted and Agree with Plan of Care  Patient       Patient will benefit from skilled therapeutic intervention in order to improve the following deficits and impairments:  Abnormal gait, Decreased mobility, Decreased balance, Decreased coordination, Decreased strength, Difficulty walking, Postural dysfunction, Pain  Visit Diagnosis: Unsteadiness on feet  Abnormal posture  Other abnormalities of gait and mobility     Problem List Patient Active Problem List   Diagnosis Date Noted  . Wound infection after surgery 03/09/2014  . 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 Hyperlipidemia   . Incontinence of urine   . Parkinson disease ([place]) 02/21/2014  . 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 Diabetes ([place]) 02/19/2014  . 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 08/20/2019, 8:38 PM  Frazier Butt., PT   [facility] [address]  [place], [place], [address] Phone: [phone]   Fax:  [phone]  Name: Randall [name] MRN: [id number] Date of Birth: [birth year]

## 2019-08-22 ENCOUNTER — Ambulatory Visit: Payer: Medicare Other | Admitting: Physical Therapy

## 2019-08-23 ENCOUNTER — Ambulatory Visit: Payer: Medicare Other | Admitting: Physical Therapy

## 2019-09-04 DIAGNOSIS — Z125 Encounter for screening for malignant neoplasm of prostate: Secondary | ICD-10-CM | POA: Diagnosis not present

## 2019-09-04 DIAGNOSIS — E7800 Pure hypercholesterolemia, unspecified: Secondary | ICD-10-CM | POA: Diagnosis not present

## 2019-09-04 DIAGNOSIS — E1129 Type 2 diabetes mellitus with other diabetic kidney complication: Secondary | ICD-10-CM | POA: Diagnosis not present

## 2019-09-06 ENCOUNTER — Ambulatory Visit: Payer: Medicare Other | Attending: Neurology | Admitting: Physical Therapy

## 2019-09-06 ENCOUNTER — Other Ambulatory Visit: Payer: Self-pay

## 2019-09-06 ENCOUNTER — Encounter: Payer: Self-pay | Admitting: Physical Therapy

## 2019-09-06 DIAGNOSIS — R2681 Unsteadiness on feet: Secondary | ICD-10-CM | POA: Diagnosis not present

## 2019-09-06 DIAGNOSIS — R2689 Other abnormalities of gait and mobility: Secondary | ICD-10-CM | POA: Diagnosis not present

## 2019-09-06 DIAGNOSIS — R29818 Other symptoms and signs involving the nervous system: Secondary | ICD-10-CM | POA: Insufficient documentation

## 2019-09-06 NOTE — Patient Instructions (Signed)

## 2019-09-07 NOTE — Therapy (Signed)
[facility] [address] [place], [place], [address] Phone: [phone]   Fax:  [phone]  Physical Therapy Treatment  Patient Details  Name: Randall [name] MRN: [id number] Date of Birth: 1941/08/09 Referring Provider (PT): Linus Mako   Encounter Date: 09/06/2019  PT End of Session - 09/07/19 9024    Visit Number  13    Number of Visits  17   recert, including 08/13/3531 visit   Date for PT Re-Evaluation  11/05/19    Authorization Type  [employer] Medicare-will need 10th visit progress note-no visit limit (auth required)    PT Start Time  1354    PT Stop Time  1435    PT Time Calculation (min)  41 min    Activity Tolerance  Patient limited by lethargy;Patient limited by fatigue    Behavior During Therapy  [facility] for tasks assessed/performed;Flat affect       Past Medical History:  Diagnosis Date  . Anxiety   . Arthritis   . Diabetes mellitus without complication ([place])   . GERD (gastroesophageal reflux disease)   . Hyperlipidemia   . Incontinence of urine    DR  RON   DAVIS    LOV FEB. 2015  . Neuromuscular disorder ([place])   . Parkinson disease ([place])   . RSD (reflex sympathetic dystrophy)    2005    Past Surgical History:  Procedure Laterality Date  . APPENDECTOMY    . BRAIN SURGERY     deep brain stimulation for parkinsons  . CARDIAC CATHETERIZATION     2005  . COLON SURGERY     resection diverticulitis  . TOTAL KNEE ARTHROPLASTY Right 02/19/2014   Procedure: TOTAL KNEE ARTHROPLASTY;  Surgeon: Hessie Dibble, MD;  Location: [facility];  Service: Orthopedics;  Laterality: Right;    There were no vitals filed for this visit.  Subjective Assessment - 09/06/19 1500    Subjective  Had a fall last night in the bathroom.  Pretty difficult to get up from the floor and I'm a little sore today.    Patient is accompained by:  Family member   wife   Pertinent History  parkinson's disease with DBS (deep brain stimulator)  placement R (04/2015), OA, hx of R TKR, depression    Patient Stated Goals  Pt's goals for therapy are to improve balance, prevent falls, to help pain and "tilt."    Currently in Pain?  Yes    Pain Score  3     Pain Location  --   All over   Pain Descriptors / Indicators  Sore    Pain Type  Acute pain   from fall yesterday   Pain Onset  More than a month ago    Aggravating Factors   fall lsat night    Pain Relieving Factors  Advil                       [facility] Adult PT Treatment/Exercise - 09/07/19 1739      Transfers   Transfers  Sit to Stand;Stand to Sit    Sit to Stand  5: Supervision;With upper extremity assist;With armrests;From chair/3-in-1    Five time sit to stand comments   31.85    Stand to Sit  5: Supervision;With upper extremity assist;With armrests;To chair/3-in-1      Ambulation/Gait   Ambulation/Gait  Yes    Ambulation/Gait Assistance  5: Supervision    Assistive device  Rolling walker  Gait Pattern  Step-through pattern;Decreased step length - right;Decreased step length - left;Decreased stride length;Decreased dorsiflexion - right;Decreased dorsiflexion - left;Right flexed knee in stance;Left flexed knee in stance;Shuffle;Decreased trunk rotation;Trunk flexed;Narrow base of support;Poor foot clearance - left;Poor foot clearance - right    Ambulation Surface  Level;Indoor    Gait velocity  18.19 sec = 1.73 ft/sec      Timed Up and Go Test   TUG  Normal TUG;Cognitive TUG   performed with rollator   Normal TUG (seconds)  31.1    Cognitive TUG (seconds)  39.28      Self-Care   Self-Care  Other Self-Care Comments    Other Self-Care Comments   Discussed POC, progress towards goals.  Discussed pt's fall yesterday, limiting today's participation (fatigued and sore) and progression to meeting goals.  Discussed fall preveniton in home environment, as well as pt/wife's goals (to continue therapy to address balance, strength, and safety with gait). Wife has  new order from Dr. Linus Mako and PT encouraged wife to bring this in next visit.  Discussed from last visit to Dr. Linus Mako possible need for speech therapy and explained what speech therapy offers (voice volume training and addressing swallowing issues) for people with Parkinson's.             PT Education - 09/07/19 1748    Education Details  Fall prevention, progress towards goals, POC; benefits of speech therapy (addressing from noted order in MD note)    Person(s) Educated  Patient;Spouse    Methods  Explanation    Comprehension  Verbalized understanding          PT Long Term Goals - 09/06/19 1506      PT LONG TERM GOAL #1   Title  Pt will verbalize plans for continued community/ongoing fitness upon d/c from PT.  Updated TARGET for LTGs(10/19/2019)-pt to return 09/26/2019    Time  5    Period  Weeks    Status  On-going    Target Date  10/19/19      PT LONG TERM GOAL #2   Title  Pt will improve 5x sit<>stand to less than or equal to 25 seconds for improved transfer efficiency and safety.    Baseline  30.19 on 07/17/19; 31.85 sec 09/06/2019    Time  5    Period  Weeks    Status  On-going    Target Date  10/19/19      PT LONG TERM GOAL #3   Title  Pt will improve TUG and TUG cognitive score to less than 10% difference, for improved dual tasking/decreased fall risk with gait.    Baseline  TUG 31.10 sec, 39.28 cog    Time  5    Period  Weeks    Status  Not Met      PT LONG TERM GOAL #4   Title  Pt will improve gait velocity to at least 2 ft/sec for decreased fall risk, improved gait efficiency and safety.    Baseline  1.73 ft/sec 09/06/2019    Time  5    Period  Weeks    Status  Not Met      PT LONG TERM GOAL #5   Title  Pt/family will verbalize understanding of fall prevention in home environment.    Time  5    Period  Weeks    Status  Achieved      Additional Long Term Goals   Additional Long Term Goals  Yes  PT LONG TERM GOAL #6   Title  Pt will  improve TUG score to less than or equal to 20 seconds for decreased fall risk.    Time  5    Period  Weeks    Status  New    Target Date  10/19/19      PT LONG TERM GOAL #7   Title  Pt will improve gait velocity to at least 1.8 ft/sec for decreased fall risk.    Time  5    Period  Weeks    Status  New    Target Date  10/19/19      PT LONG TERM GOAL #8   Title  Pt/wife will demonstrate understanding of optimal floor to stand transfer technique, for fall recovery.    Time  5    Period  Weeks    Status  New    Target Date  10/19/19            Plan - 09/07/19 1756    Clinical Impression Statement  Assessed LTGs this visit, with pt meeting 1 of 5 LTGs (fall prevention).  LTG 1 and 2 are ongoing; LTG 3 and 4 are not met.  In speaking with pt and wife, they are concerned about falls (pt has had recent fall yesterday) and fall recovery, would like to return for therapy for several additional visits to fully update HEP and focus on strategies for balance, fall recovery.  Please note that LTG 1 and 2 are ongoing; LTG 6, 7, 8 are new and will be addressed in upcoming PT sessions.    Personal Factors and Comorbidities  Comorbidity 3+    Comorbidities  parkinson's disease with DBS (deep brain stimulator) placement R (04/2015), OA, hx of R TKR, depression    Examination-Activity Limitations  Locomotion Level;Stand;Sit    Stability/Clinical Decision Making  Evolving/Moderate complexity    Rehab Potential  Good    PT Frequency  1x / week    PT Duration  Other (comment)   per recert 24/07/1858, 5 weeks, including 09/06/2019 visit   PT Treatment/Interventions  ADLs/Self Care Home Management;Therapeutic exercise;Therapeutic activities;Functional mobility training;Gait training;DME Instruction;Balance training;Neuromuscular re-education;Patient/family education    PT Next Visit Plan  Floor to stand transfers; sit to stand transfer training, HEP additions for balance if needed    Consulted and Agree  with Plan of Care  Patient       Patient will benefit from skilled therapeutic intervention in order to improve the following deficits and impairments:  Abnormal gait, Decreased mobility, Decreased balance, Decreased coordination, Decreased strength, Difficulty walking, Postural dysfunction, Pain  Visit Diagnosis: Unsteadiness on feet  Other abnormalities of gait and mobility  Other symptoms and signs involving the nervous system     Problem List Patient Active Problem List   Diagnosis Date Noted  . Wound infection after surgery 03/09/2014  . 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 Hyperlipidemia   . Incontinence of urine   . Parkinson disease ([place]) 02/21/2014  . 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 Diabetes ([place]) 02/19/2014  . 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 09/07/2019, 6:06 PM Frazier Butt., PT  [facility] [address] [place], [place], [address] Phone: [phone]   Fax:  [phone]  Name: COLLAN SCHOENFELD MRN: [id number] Date of Birth: 01/17/1941

## 2019-09-11 ENCOUNTER — Encounter: Payer: Self-pay | Admitting: Gastroenterology

## 2019-09-11 ENCOUNTER — Ambulatory Visit: Payer: Medicare Other | Admitting: Gastroenterology

## 2019-09-11 VITALS — BP 138/64 | HR 92 | Temp 97.4°F | Ht 71.0 in | Wt 162.5 lb

## 2019-09-11 DIAGNOSIS — K529 Noninfective gastroenteritis and colitis, unspecified: Secondary | ICD-10-CM | POA: Diagnosis not present

## 2019-09-11 DIAGNOSIS — Z0000 Encounter for general adult medical examination without abnormal findings: Secondary | ICD-10-CM | POA: Diagnosis not present

## 2019-09-11 DIAGNOSIS — N1831 Chronic kidney disease, stage 3a: Secondary | ICD-10-CM | POA: Diagnosis not present

## 2019-09-11 DIAGNOSIS — K8681 Exocrine pancreatic insufficiency: Secondary | ICD-10-CM

## 2019-09-11 DIAGNOSIS — G20 Parkinson's disease: Secondary | ICD-10-CM | POA: Diagnosis not present

## 2019-09-11 DIAGNOSIS — I1310 Hypertensive heart and chronic kidney disease without heart failure, with stage 1 through stage 4 chronic kidney disease, or unspecified chronic kidney disease: Secondary | ICD-10-CM | POA: Diagnosis not present

## 2019-09-11 NOTE — Patient Instructions (Signed)
If you are age 79 or older, your body mass index should be between 23-30. Your Body mass index is 22.66 kg/m. If this is out of the aforementioned range listed, please consider follow up with your Primary Care Provider.  If you are age 56 or younger, your body mass index should be between 19-25. Your Body mass index is 22.66 kg/m. If this is out of the aformentioned range listed, please consider follow up with your Primary Care Provider.   It was a pleasure to see you today!  Dr. Loletha Carrow

## 2019-09-11 NOTE — Progress Notes (Signed)
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  Last visit was telemedicine in March, when Randall [name] reported it had lost about 10 pounds and also had suboptimal glucose control.  Randall [name] and Randall [name] tell me that there were occasions of loose greasy stool, and they feel confident it stopped when he cut down Creon from 2 capsules to 1 per meal.  He also continues to have alcohol, and still likes to have cheese and crackers is a late night snack.  He stopped his Creon altogether a couple days ago.  Randall [name] says his weight was in the low to mid 160s when seen by primary care several months ago.  And has had worsening issues with his balance and several falls.  His neurologist has him going to therapy. They tell me he has had some early morning low glucose recordings and further adjustments of his insulin regimen have been made.  ROS: Cardiovascular:  no chest pain Respiratory: no dyspnea He denies nausea vomiting dysphagia rash or jaundice  The patient's Past Medical, Family and Social History were reviewed and are on file in the EMR.  Objective:  Med list reviewed  Current Outpatient Medications:  .  Carbidopa-Levodopa ER (SINEMET CR) 25-100 MG tablet controlled release, Take 1.5 tablets by mouth 3 (three) times daily., Disp: , Rfl:  .  CREON 36000 units CPEP capsule, TAKE 2 CAPSULES WITH EACH MEAL AND 1 CAPSULE WITH SNACKS. (Patient taking differently: Take 36,000 Units by mouth as directed. Take 1 tablet with each meal and 1 tablet with a big snack), Disp: 270 capsule, Rfl: 6 .  donepezil (ARICEPT) 5 MG tablet, Take 5 mg by mouth at bedtime., Disp: , Rfl:  .  fesoterodine (TOVIAZ) 8 MG TB24 tablet, Take 8 mg by mouth daily., Disp: , Rfl:  .  insulin glargine (LANTUS) 100 unit/mL SOPN, Inject into the skin as directed. Will take 30 in the morning and 24 at  nighttime. Will decrease or increase depending on sugar level, Disp: , Rfl:  .  insulin lispro (HUMALOG) 100 UNIT/ML injection, Inject 5 Units into the skin as needed for high blood sugar., Disp: , Rfl:  .  rOPINIRole (REQUIP XL) 8 MG 24 hr tablet, Take 8 mg by mouth every morning. Treats Parkinson's disease, Disp: , Rfl:  .  simvastatin (ZOCOR) 40 MG tablet, Take 40 mg by mouth daily. Lowers cholesterol, Disp: , Rfl:  .  venlafaxine XR (EFFEXOR XR) 75 MG 24 hr capsule, Take 225 mg by mouth daily with breakfast. , Disp: , Rfl:   Current Facility-Administered Medications:  .  dextrose 5 % solution, , Intravenous, Continuous, Danis, Henry L III, MD .  dextrose 50 % solution 25 mL, 25 mL, Intravenous, Once, Danis, Kirke Corin, MD   Vital signs in last 24 hrs: Vitals:   09/11/19 1338  BP: 138/64  Pulse: 92  Temp: (!) 97.4 F (36.3 C)   Last Weight  Most recent update: 09/11/2019  1:39 PM   Weight  73.7 kg (162 lb 8 oz)          Sept 2019 - 172#  Physical Exam  He has fair muscle mass as before.  His neurologic status has declined since I saw him just over a year ago.  His balance is poor, he has a pill-rolling tremor and antepulsion with short ambulation from chair to exam  table.  He is pleasant and conversational, with restricted affect as before.  HEENT: sclera anicteric, oral mucosa moist without lesions  Neck: supple, no thyromegaly, JVD or lymphadenopathy  Cardiac: RRR without murmurs, S1S2 heard, no peripheral edema  Pulm: clear to auscultation bilaterally, normal RR and effort noted  Abdomen: soft, no tenderness, with active bowel sounds. No guarding or palpable hepatosplenomegaly.  Skin; warm and dry, no jaundice or rash.  He has a shiner under his right eye from a recent fall  No outpatient labs for review  @ASSESSMENTPLANBEGIN @ Assessment: Encounter Diagnoses  Name Primary?  Exocrine pancreatic insufficiency Yes  . Chronic diarrhea    They feel strongly that  his symptoms are better after a decrease from 2 capsules to 1 of the Creon per meal.  I do not know why that should be, but we will compromise staying at that dose, because I do not want him to stop it altogether.  He lost a lot of weight with severe diarrhea before he was on pancreatic enzymes, and there is no reason to suspect that his pancreatic function is any better now than it was in the past.  Some of his persistent symptoms seem to be dietary indiscretion and ongoing alcohol use as well.  His most worrisome issue seems to be declining neurologic function. Fortunately, he no longer appears to be losing weight according to his wife, and he does not have jaundice or icterus.  Plan: Creon dosing as noted above. See me in 6 months or call sooner as needed.   Total time 20 minutes, over half spent face-to-face with patient in counseling and coordination of care.   Marland Kitchen III

## 2019-09-19 DIAGNOSIS — Z794 Long term (current) use of insulin: Secondary | ICD-10-CM | POA: Diagnosis not present

## 2019-09-19 DIAGNOSIS — E1129 Type 2 diabetes mellitus with other diabetic kidney complication: Secondary | ICD-10-CM | POA: Diagnosis not present

## 2019-09-19 DIAGNOSIS — N183 Chronic kidney disease, stage 3 unspecified: Secondary | ICD-10-CM | POA: Diagnosis not present

## 2019-09-19 DIAGNOSIS — I1310 Hypertensive heart and chronic kidney disease without heart failure, with stage 1 through stage 4 chronic kidney disease, or unspecified chronic kidney disease: Secondary | ICD-10-CM | POA: Diagnosis not present

## 2019-09-26 ENCOUNTER — Ambulatory Visit: Payer: Medicare Other | Admitting: Physical Therapy

## 2019-10-01 ENCOUNTER — Encounter: Payer: Self-pay | Admitting: Physical Therapy

## 2019-10-01 NOTE — Therapy (Signed)
[facility] [address] [place], [place], [address] Phone: [phone]   Fax:  [phone]  Patient Details  Name: Randall [name] MRN: [id number] Date of Birth: 01-28-1941 Referring Provider:  No ref. provider found  Encounter Date: 10/01/2019  PHYSICAL THERAPY DISCHARGE SUMMARY  Visits from Start of Care: 13  Current functional level related to goals / functional outcomes: Pt's goals were renewed after 09/06/2019 visit; however, due to medical issue, pt has cancelled remaining PT visits, so not able to fully address/assess LTGs.   Remaining deficits: Posture, balance, strength, falls   Education / Equipment: Educated in fall prevention, HEP.  Plan: Patient agrees to discharge.  Patient goals were not met. Patient is being discharged due to a change in medical status.  ?????  Mady Haagensen, PT 10/01/19 1:07 PM Phone: [phone] Fax: [place] 10/01/2019, 1:05 PM  [facility] [address] [place], [place], [address] Phone: [phone]   Fax:  [phone]

## 2019-10-10 ENCOUNTER — Ambulatory Visit: Payer: Medicare Other | Admitting: Physical Therapy

## 2019-10-12 DIAGNOSIS — M25562 Pain in left knee: Secondary | ICD-10-CM | POA: Diagnosis not present

## 2019-10-17 ENCOUNTER — Ambulatory Visit: Payer: Medicare Other | Admitting: Physical Therapy

## 2019-10-24 ENCOUNTER — Ambulatory Visit: Payer: Medicare Other | Admitting: Physical Therapy

## 2019-10-30 DIAGNOSIS — Z794 Long term (current) use of insulin: Secondary | ICD-10-CM | POA: Diagnosis not present

## 2019-10-30 DIAGNOSIS — N1831 Chronic kidney disease, stage 3a: Secondary | ICD-10-CM | POA: Diagnosis not present

## 2019-10-30 DIAGNOSIS — E1129 Type 2 diabetes mellitus with other diabetic kidney complication: Secondary | ICD-10-CM | POA: Diagnosis not present

## 2019-10-30 DIAGNOSIS — I1310 Hypertensive heart and chronic kidney disease without heart failure, with stage 1 through stage 4 chronic kidney disease, or unspecified chronic kidney disease: Secondary | ICD-10-CM | POA: Diagnosis not present

## 2019-11-12 DIAGNOSIS — E291 Testicular hypofunction: Secondary | ICD-10-CM | POA: Diagnosis not present

## 2019-11-12 DIAGNOSIS — G20 Parkinson's disease: Secondary | ICD-10-CM | POA: Diagnosis not present

## 2019-11-12 DIAGNOSIS — N3941 Urge incontinence: Secondary | ICD-10-CM | POA: Diagnosis not present

## 2019-11-12 DIAGNOSIS — N401 Enlarged prostate with lower urinary tract symptoms: Secondary | ICD-10-CM | POA: Diagnosis not present

## 2019-12-03 DIAGNOSIS — Z20818 Contact with and (suspected) exposure to other bacterial communicable diseases: Secondary | ICD-10-CM | POA: Diagnosis not present

## 2019-12-03 DIAGNOSIS — R05 Cough: Secondary | ICD-10-CM | POA: Diagnosis not present

## 2019-12-03 DIAGNOSIS — R52 Pain, unspecified: Secondary | ICD-10-CM | POA: Diagnosis not present

## 2019-12-03 DIAGNOSIS — J069 Acute upper respiratory infection, unspecified: Secondary | ICD-10-CM | POA: Diagnosis not present

## 2019-12-15 ENCOUNTER — Ambulatory Visit: Payer: Medicare Other | Attending: Internal Medicine

## 2019-12-15 DIAGNOSIS — Z23 Encounter for immunization: Secondary | ICD-10-CM | POA: Insufficient documentation

## 2019-12-15 NOTE — Progress Notes (Signed)
   Covid-19 Vaccination Clinic  Name:  Randall [name]    MRN: [id number] DOB: 11-Jul-1941  12/15/2019  [name] was observed post Covid-19 immunization for 30 minutes based on pre-vaccination screening without incidence. He was provided with Vaccine Information Sheet and instruction to access the V-Safe system.   [name] was instructed to call 911 with any severe reactions post vaccine: Marland Kitchen Difficulty breathing  . Swelling of your face and throat  . A fast heartbeat  . A bad rash all over your body  . Dizziness and weakness    Immunizations Administered    Name Date Dose VIS Date Route   Pfizer COVID-19 Vaccine 12/15/2019 12:47 PM 0.3 mL 11/16/2019 Intramuscular   Manufacturer: [employer], [employer]   Lot: [id number]   NDC: [address]-2

## 2020-01-01 DIAGNOSIS — Z794 Long term (current) use of insulin: Secondary | ICD-10-CM | POA: Diagnosis not present

## 2020-01-01 DIAGNOSIS — E1129 Type 2 diabetes mellitus with other diabetic kidney complication: Secondary | ICD-10-CM | POA: Diagnosis not present

## 2020-01-01 DIAGNOSIS — I1310 Hypertensive heart and chronic kidney disease without heart failure, with stage 1 through stage 4 chronic kidney disease, or unspecified chronic kidney disease: Secondary | ICD-10-CM | POA: Diagnosis not present

## 2020-01-01 DIAGNOSIS — N1831 Chronic kidney disease, stage 3a: Secondary | ICD-10-CM | POA: Diagnosis not present

## 2020-01-03 ENCOUNTER — Ambulatory Visit: Payer: Medicare Other

## 2020-01-05 ENCOUNTER — Ambulatory Visit: Payer: Medicare Other | Attending: Internal Medicine

## 2020-01-05 DIAGNOSIS — Z23 Encounter for immunization: Secondary | ICD-10-CM | POA: Insufficient documentation

## 2020-01-05 NOTE — Progress Notes (Signed)
   Covid-19 Vaccination Clinic  Name:  Randall [name]    MRN: [id number] DOB: January 03, 1941  01/05/2020  Randall [name] was observed post Covid-19 immunization for 15 minutes without incidence. He was provided with Vaccine Information Sheet and instruction to access the V-Safe system.   Randall [name] was instructed to call 911 with any severe reactions post vaccine: Marland Kitchen Difficulty breathing  . Swelling of your face and throat  . A fast heartbeat  . A bad rash all over your body  . Dizziness and weakness    Immunizations Administered    Name Date Dose VIS Date Route   Pfizer COVID-19 Vaccine 01/05/2020  9:36 AM 0.3 mL 11/16/2019 Intramuscular   Manufacturer: [employer], [employer]   Lot: [id number]   NDC: [address]-7

## 2020-01-14 DIAGNOSIS — N1831 Chronic kidney disease, stage 3a: Secondary | ICD-10-CM | POA: Diagnosis not present

## 2020-01-14 DIAGNOSIS — Z1331 Encounter for screening for depression: Secondary | ICD-10-CM | POA: Diagnosis not present

## 2020-01-14 DIAGNOSIS — Z1339 Encounter for screening examination for other mental health and behavioral disorders: Secondary | ICD-10-CM | POA: Diagnosis not present

## 2020-01-14 DIAGNOSIS — E1129 Type 2 diabetes mellitus with other diabetic kidney complication: Secondary | ICD-10-CM | POA: Diagnosis not present

## 2020-01-14 DIAGNOSIS — R809 Proteinuria, unspecified: Secondary | ICD-10-CM | POA: Diagnosis not present

## 2020-01-14 DIAGNOSIS — I1310 Hypertensive heart and chronic kidney disease without heart failure, with stage 1 through stage 4 chronic kidney disease, or unspecified chronic kidney disease: Secondary | ICD-10-CM | POA: Diagnosis not present

## 2020-01-15 DIAGNOSIS — L98491 Non-pressure chronic ulcer of skin of other sites limited to breakdown of skin: Secondary | ICD-10-CM | POA: Diagnosis not present

## 2020-01-15 DIAGNOSIS — E1142 Type 2 diabetes mellitus with diabetic polyneuropathy: Secondary | ICD-10-CM | POA: Diagnosis not present

## 2020-01-22 DIAGNOSIS — Z0120 Encounter for dental examination and cleaning without abnormal findings: Secondary | ICD-10-CM | POA: Diagnosis not present

## 2020-02-01 DIAGNOSIS — M25562 Pain in left knee: Secondary | ICD-10-CM | POA: Diagnosis not present

## 2020-02-04 DIAGNOSIS — H04123 Dry eye syndrome of bilateral lacrimal glands: Secondary | ICD-10-CM | POA: Diagnosis not present

## 2020-02-04 DIAGNOSIS — E119 Type 2 diabetes mellitus without complications: Secondary | ICD-10-CM | POA: Diagnosis not present

## 2020-02-05 DIAGNOSIS — M6281 Muscle weakness (generalized): Secondary | ICD-10-CM | POA: Diagnosis not present

## 2020-02-05 DIAGNOSIS — M1712 Unilateral primary osteoarthritis, left knee: Secondary | ICD-10-CM | POA: Diagnosis not present

## 2020-02-05 DIAGNOSIS — R269 Unspecified abnormalities of gait and mobility: Secondary | ICD-10-CM | POA: Diagnosis not present

## 2020-02-05 DIAGNOSIS — G20 Parkinson's disease: Secondary | ICD-10-CM | POA: Diagnosis not present

## 2020-02-08 DIAGNOSIS — M1712 Unilateral primary osteoarthritis, left knee: Secondary | ICD-10-CM | POA: Diagnosis not present

## 2020-02-08 DIAGNOSIS — G20 Parkinson's disease: Secondary | ICD-10-CM | POA: Diagnosis not present

## 2020-02-08 DIAGNOSIS — M6281 Muscle weakness (generalized): Secondary | ICD-10-CM | POA: Diagnosis not present

## 2020-02-08 DIAGNOSIS — R269 Unspecified abnormalities of gait and mobility: Secondary | ICD-10-CM | POA: Diagnosis not present

## 2020-02-13 DIAGNOSIS — M1712 Unilateral primary osteoarthritis, left knee: Secondary | ICD-10-CM | POA: Diagnosis not present

## 2020-02-13 DIAGNOSIS — M6281 Muscle weakness (generalized): Secondary | ICD-10-CM | POA: Diagnosis not present

## 2020-02-13 DIAGNOSIS — G20 Parkinson's disease: Secondary | ICD-10-CM | POA: Diagnosis not present

## 2020-02-13 DIAGNOSIS — R269 Unspecified abnormalities of gait and mobility: Secondary | ICD-10-CM | POA: Diagnosis not present

## 2020-02-15 DIAGNOSIS — M1712 Unilateral primary osteoarthritis, left knee: Secondary | ICD-10-CM | POA: Diagnosis not present

## 2020-02-15 DIAGNOSIS — R269 Unspecified abnormalities of gait and mobility: Secondary | ICD-10-CM | POA: Diagnosis not present

## 2020-02-15 DIAGNOSIS — M6281 Muscle weakness (generalized): Secondary | ICD-10-CM | POA: Diagnosis not present

## 2020-02-15 DIAGNOSIS — G20 Parkinson's disease: Secondary | ICD-10-CM | POA: Diagnosis not present

## 2020-02-19 DIAGNOSIS — G20 Parkinson's disease: Secondary | ICD-10-CM | POA: Diagnosis not present

## 2020-02-19 DIAGNOSIS — R269 Unspecified abnormalities of gait and mobility: Secondary | ICD-10-CM | POA: Diagnosis not present

## 2020-02-19 DIAGNOSIS — M1712 Unilateral primary osteoarthritis, left knee: Secondary | ICD-10-CM | POA: Diagnosis not present

## 2020-02-19 DIAGNOSIS — M6281 Muscle weakness (generalized): Secondary | ICD-10-CM | POA: Diagnosis not present

## 2020-02-21 DIAGNOSIS — M6281 Muscle weakness (generalized): Secondary | ICD-10-CM | POA: Diagnosis not present

## 2020-02-21 DIAGNOSIS — M1712 Unilateral primary osteoarthritis, left knee: Secondary | ICD-10-CM | POA: Diagnosis not present

## 2020-02-21 DIAGNOSIS — G20 Parkinson's disease: Secondary | ICD-10-CM | POA: Diagnosis not present

## 2020-02-21 DIAGNOSIS — R269 Unspecified abnormalities of gait and mobility: Secondary | ICD-10-CM | POA: Diagnosis not present

## 2020-02-26 ENCOUNTER — Other Ambulatory Visit: Payer: Self-pay | Admitting: Gastroenterology

## 2020-02-26 DIAGNOSIS — M1712 Unilateral primary osteoarthritis, left knee: Secondary | ICD-10-CM | POA: Diagnosis not present

## 2020-02-26 DIAGNOSIS — G20 Parkinson's disease: Secondary | ICD-10-CM | POA: Diagnosis not present

## 2020-02-26 DIAGNOSIS — M6281 Muscle weakness (generalized): Secondary | ICD-10-CM | POA: Diagnosis not present

## 2020-02-26 DIAGNOSIS — R269 Unspecified abnormalities of gait and mobility: Secondary | ICD-10-CM | POA: Diagnosis not present

## 2020-03-01 DIAGNOSIS — M25512 Pain in left shoulder: Secondary | ICD-10-CM | POA: Diagnosis not present

## 2020-03-13 DIAGNOSIS — G20 Parkinson's disease: Secondary | ICD-10-CM | POA: Diagnosis not present

## 2020-03-13 DIAGNOSIS — M1712 Unilateral primary osteoarthritis, left knee: Secondary | ICD-10-CM | POA: Diagnosis not present

## 2020-03-13 DIAGNOSIS — R269 Unspecified abnormalities of gait and mobility: Secondary | ICD-10-CM | POA: Diagnosis not present

## 2020-03-13 DIAGNOSIS — M6281 Muscle weakness (generalized): Secondary | ICD-10-CM | POA: Diagnosis not present

## 2020-03-18 DIAGNOSIS — G20 Parkinson's disease: Secondary | ICD-10-CM | POA: Diagnosis not present

## 2020-03-18 DIAGNOSIS — M6281 Muscle weakness (generalized): Secondary | ICD-10-CM | POA: Diagnosis not present

## 2020-03-18 DIAGNOSIS — R269 Unspecified abnormalities of gait and mobility: Secondary | ICD-10-CM | POA: Diagnosis not present

## 2020-03-18 DIAGNOSIS — M1712 Unilateral primary osteoarthritis, left knee: Secondary | ICD-10-CM | POA: Diagnosis not present

## 2020-03-19 DIAGNOSIS — E1129 Type 2 diabetes mellitus with other diabetic kidney complication: Secondary | ICD-10-CM | POA: Diagnosis not present

## 2020-03-19 DIAGNOSIS — I1310 Hypertensive heart and chronic kidney disease without heart failure, with stage 1 through stage 4 chronic kidney disease, or unspecified chronic kidney disease: Secondary | ICD-10-CM | POA: Diagnosis not present

## 2020-03-19 DIAGNOSIS — Z794 Long term (current) use of insulin: Secondary | ICD-10-CM | POA: Diagnosis not present

## 2020-03-19 DIAGNOSIS — N1831 Chronic kidney disease, stage 3a: Secondary | ICD-10-CM | POA: Diagnosis not present

## 2020-03-20 DIAGNOSIS — R269 Unspecified abnormalities of gait and mobility: Secondary | ICD-10-CM | POA: Diagnosis not present

## 2020-03-20 DIAGNOSIS — M6281 Muscle weakness (generalized): Secondary | ICD-10-CM | POA: Diagnosis not present

## 2020-03-20 DIAGNOSIS — M1712 Unilateral primary osteoarthritis, left knee: Secondary | ICD-10-CM | POA: Diagnosis not present

## 2020-03-20 DIAGNOSIS — G20 Parkinson's disease: Secondary | ICD-10-CM | POA: Diagnosis not present

## 2020-03-27 DIAGNOSIS — G20 Parkinson's disease: Secondary | ICD-10-CM | POA: Diagnosis not present

## 2020-03-27 DIAGNOSIS — R269 Unspecified abnormalities of gait and mobility: Secondary | ICD-10-CM | POA: Diagnosis not present

## 2020-03-27 DIAGNOSIS — M1712 Unilateral primary osteoarthritis, left knee: Secondary | ICD-10-CM | POA: Diagnosis not present

## 2020-03-27 DIAGNOSIS — M6281 Muscle weakness (generalized): Secondary | ICD-10-CM | POA: Diagnosis not present

## 2020-04-01 DIAGNOSIS — M6281 Muscle weakness (generalized): Secondary | ICD-10-CM | POA: Diagnosis not present

## 2020-04-01 DIAGNOSIS — R269 Unspecified abnormalities of gait and mobility: Secondary | ICD-10-CM | POA: Diagnosis not present

## 2020-04-01 DIAGNOSIS — M1712 Unilateral primary osteoarthritis, left knee: Secondary | ICD-10-CM | POA: Diagnosis not present

## 2020-04-01 DIAGNOSIS — G20 Parkinson's disease: Secondary | ICD-10-CM | POA: Diagnosis not present

## 2020-04-08 DIAGNOSIS — G20 Parkinson's disease: Secondary | ICD-10-CM | POA: Diagnosis not present

## 2020-04-08 DIAGNOSIS — R269 Unspecified abnormalities of gait and mobility: Secondary | ICD-10-CM | POA: Diagnosis not present

## 2020-04-08 DIAGNOSIS — M6281 Muscle weakness (generalized): Secondary | ICD-10-CM | POA: Diagnosis not present

## 2020-04-08 DIAGNOSIS — M1712 Unilateral primary osteoarthritis, left knee: Secondary | ICD-10-CM | POA: Diagnosis not present

## 2020-04-10 DIAGNOSIS — M1712 Unilateral primary osteoarthritis, left knee: Secondary | ICD-10-CM | POA: Diagnosis not present

## 2020-04-10 DIAGNOSIS — M6281 Muscle weakness (generalized): Secondary | ICD-10-CM | POA: Diagnosis not present

## 2020-04-10 DIAGNOSIS — M7542 Impingement syndrome of left shoulder: Secondary | ICD-10-CM | POA: Diagnosis not present

## 2020-04-14 DIAGNOSIS — R52 Pain, unspecified: Secondary | ICD-10-CM | POA: Diagnosis not present

## 2020-04-14 DIAGNOSIS — S62641A Nondisplaced fracture of proximal phalanx of left index finger, initial encounter for closed fracture: Secondary | ICD-10-CM | POA: Diagnosis not present

## 2020-04-14 DIAGNOSIS — S62643A Nondisplaced fracture of proximal phalanx of left middle finger, initial encounter for closed fracture: Secondary | ICD-10-CM | POA: Diagnosis not present

## 2020-04-14 DIAGNOSIS — M79642 Pain in left hand: Secondary | ICD-10-CM | POA: Diagnosis not present

## 2020-04-14 DIAGNOSIS — M19042 Primary osteoarthritis, left hand: Secondary | ICD-10-CM | POA: Diagnosis not present

## 2020-04-14 DIAGNOSIS — S62611A Displaced fracture of proximal phalanx of left index finger, initial encounter for closed fracture: Secondary | ICD-10-CM | POA: Diagnosis not present

## 2020-04-15 DIAGNOSIS — M6281 Muscle weakness (generalized): Secondary | ICD-10-CM | POA: Diagnosis not present

## 2020-04-15 DIAGNOSIS — M1712 Unilateral primary osteoarthritis, left knee: Secondary | ICD-10-CM | POA: Diagnosis not present

## 2020-04-15 DIAGNOSIS — G20 Parkinson's disease: Secondary | ICD-10-CM | POA: Diagnosis not present

## 2020-04-15 DIAGNOSIS — M7542 Impingement syndrome of left shoulder: Secondary | ICD-10-CM | POA: Diagnosis not present

## 2020-04-17 ENCOUNTER — Other Ambulatory Visit: Payer: Self-pay | Admitting: Gastroenterology

## 2020-04-17 DIAGNOSIS — G20 Parkinson's disease: Secondary | ICD-10-CM | POA: Diagnosis not present

## 2020-04-17 DIAGNOSIS — M6281 Muscle weakness (generalized): Secondary | ICD-10-CM | POA: Diagnosis not present

## 2020-04-17 DIAGNOSIS — M1712 Unilateral primary osteoarthritis, left knee: Secondary | ICD-10-CM | POA: Diagnosis not present

## 2020-04-17 DIAGNOSIS — M7542 Impingement syndrome of left shoulder: Secondary | ICD-10-CM | POA: Diagnosis not present

## 2020-04-22 DIAGNOSIS — M1712 Unilateral primary osteoarthritis, left knee: Secondary | ICD-10-CM | POA: Diagnosis not present

## 2020-04-22 DIAGNOSIS — G20 Parkinson's disease: Secondary | ICD-10-CM | POA: Diagnosis not present

## 2020-04-22 DIAGNOSIS — M7542 Impingement syndrome of left shoulder: Secondary | ICD-10-CM | POA: Diagnosis not present

## 2020-04-22 DIAGNOSIS — M6281 Muscle weakness (generalized): Secondary | ICD-10-CM | POA: Diagnosis not present

## 2020-04-25 DIAGNOSIS — Z9689 Presence of other specified functional implants: Secondary | ICD-10-CM | POA: Diagnosis not present

## 2020-04-25 DIAGNOSIS — Z79899 Other long term (current) drug therapy: Secondary | ICD-10-CM | POA: Diagnosis not present

## 2020-04-25 DIAGNOSIS — Z9682 Presence of neurostimulator: Secondary | ICD-10-CM | POA: Diagnosis not present

## 2020-04-25 DIAGNOSIS — Z4542 Encounter for adjustment and management of neuropacemaker (brain) (peripheral nerve) (spinal cord): Secondary | ICD-10-CM | POA: Diagnosis not present

## 2020-04-25 DIAGNOSIS — G20 Parkinson's disease: Secondary | ICD-10-CM | POA: Diagnosis not present

## 2020-04-28 DIAGNOSIS — S62641A Nondisplaced fracture of proximal phalanx of left index finger, initial encounter for closed fracture: Secondary | ICD-10-CM | POA: Diagnosis not present

## 2020-04-28 DIAGNOSIS — S62643A Nondisplaced fracture of proximal phalanx of left middle finger, initial encounter for closed fracture: Secondary | ICD-10-CM | POA: Diagnosis not present

## 2020-04-28 DIAGNOSIS — M19022 Primary osteoarthritis, left elbow: Secondary | ICD-10-CM | POA: Diagnosis not present

## 2020-04-28 DIAGNOSIS — M7712 Lateral epicondylitis, left elbow: Secondary | ICD-10-CM | POA: Diagnosis not present

## 2020-04-28 DIAGNOSIS — R52 Pain, unspecified: Secondary | ICD-10-CM | POA: Diagnosis not present

## 2020-04-29 DIAGNOSIS — G20 Parkinson's disease: Secondary | ICD-10-CM | POA: Diagnosis not present

## 2020-04-29 DIAGNOSIS — M1712 Unilateral primary osteoarthritis, left knee: Secondary | ICD-10-CM | POA: Diagnosis not present

## 2020-04-29 DIAGNOSIS — M7542 Impingement syndrome of left shoulder: Secondary | ICD-10-CM | POA: Diagnosis not present

## 2020-04-29 DIAGNOSIS — M6281 Muscle weakness (generalized): Secondary | ICD-10-CM | POA: Diagnosis not present

## 2020-05-08 DIAGNOSIS — M7542 Impingement syndrome of left shoulder: Secondary | ICD-10-CM | POA: Diagnosis not present

## 2020-05-08 DIAGNOSIS — G20 Parkinson's disease: Secondary | ICD-10-CM | POA: Diagnosis not present

## 2020-05-08 DIAGNOSIS — M1712 Unilateral primary osteoarthritis, left knee: Secondary | ICD-10-CM | POA: Diagnosis not present

## 2020-05-08 DIAGNOSIS — M6281 Muscle weakness (generalized): Secondary | ICD-10-CM | POA: Diagnosis not present

## 2020-05-09 DIAGNOSIS — M79602 Pain in left arm: Secondary | ICD-10-CM | POA: Diagnosis not present

## 2020-05-09 DIAGNOSIS — M25622 Stiffness of left elbow, not elsewhere classified: Secondary | ICD-10-CM | POA: Diagnosis not present

## 2020-05-09 DIAGNOSIS — M62838 Other muscle spasm: Secondary | ICD-10-CM | POA: Diagnosis not present

## 2020-05-09 DIAGNOSIS — M7712 Lateral epicondylitis, left elbow: Secondary | ICD-10-CM | POA: Diagnosis not present

## 2020-05-12 DIAGNOSIS — M79602 Pain in left arm: Secondary | ICD-10-CM | POA: Diagnosis not present

## 2020-05-12 DIAGNOSIS — M25622 Stiffness of left elbow, not elsewhere classified: Secondary | ICD-10-CM | POA: Diagnosis not present

## 2020-05-12 DIAGNOSIS — M62838 Other muscle spasm: Secondary | ICD-10-CM | POA: Diagnosis not present

## 2020-05-12 DIAGNOSIS — M7712 Lateral epicondylitis, left elbow: Secondary | ICD-10-CM | POA: Diagnosis not present

## 2020-05-19 DIAGNOSIS — E1129 Type 2 diabetes mellitus with other diabetic kidney complication: Secondary | ICD-10-CM | POA: Diagnosis not present

## 2020-05-19 DIAGNOSIS — M7712 Lateral epicondylitis, left elbow: Secondary | ICD-10-CM | POA: Diagnosis not present

## 2020-05-19 DIAGNOSIS — G20 Parkinson's disease: Secondary | ICD-10-CM | POA: Diagnosis not present

## 2020-05-19 DIAGNOSIS — I1310 Hypertensive heart and chronic kidney disease without heart failure, with stage 1 through stage 4 chronic kidney disease, or unspecified chronic kidney disease: Secondary | ICD-10-CM | POA: Diagnosis not present

## 2020-05-19 DIAGNOSIS — N1831 Chronic kidney disease, stage 3a: Secondary | ICD-10-CM | POA: Diagnosis not present

## 2020-05-21 DIAGNOSIS — M79602 Pain in left arm: Secondary | ICD-10-CM | POA: Diagnosis not present

## 2020-05-21 DIAGNOSIS — M62838 Other muscle spasm: Secondary | ICD-10-CM | POA: Diagnosis not present

## 2020-05-21 DIAGNOSIS — M7712 Lateral epicondylitis, left elbow: Secondary | ICD-10-CM | POA: Diagnosis not present

## 2020-05-21 DIAGNOSIS — M25622 Stiffness of left elbow, not elsewhere classified: Secondary | ICD-10-CM | POA: Diagnosis not present

## 2020-05-27 DIAGNOSIS — M79602 Pain in left arm: Secondary | ICD-10-CM | POA: Diagnosis not present

## 2020-05-27 DIAGNOSIS — M7712 Lateral epicondylitis, left elbow: Secondary | ICD-10-CM | POA: Diagnosis not present

## 2020-05-27 DIAGNOSIS — M25622 Stiffness of left elbow, not elsewhere classified: Secondary | ICD-10-CM | POA: Diagnosis not present

## 2020-05-27 DIAGNOSIS — M62838 Other muscle spasm: Secondary | ICD-10-CM | POA: Diagnosis not present

## 2020-06-02 DIAGNOSIS — S62641A Nondisplaced fracture of proximal phalanx of left index finger, initial encounter for closed fracture: Secondary | ICD-10-CM | POA: Diagnosis not present

## 2020-06-02 DIAGNOSIS — M7712 Lateral epicondylitis, left elbow: Secondary | ICD-10-CM | POA: Diagnosis not present

## 2020-06-02 DIAGNOSIS — S62643A Nondisplaced fracture of proximal phalanx of left middle finger, initial encounter for closed fracture: Secondary | ICD-10-CM | POA: Diagnosis not present

## 2020-06-06 ENCOUNTER — Other Ambulatory Visit: Payer: Self-pay | Admitting: Gastroenterology

## 2020-06-13 DIAGNOSIS — M7712 Lateral epicondylitis, left elbow: Secondary | ICD-10-CM | POA: Diagnosis not present

## 2020-06-13 DIAGNOSIS — M62838 Other muscle spasm: Secondary | ICD-10-CM | POA: Diagnosis not present

## 2020-06-13 DIAGNOSIS — R293 Abnormal posture: Secondary | ICD-10-CM | POA: Diagnosis not present

## 2020-06-13 DIAGNOSIS — M25622 Stiffness of left elbow, not elsewhere classified: Secondary | ICD-10-CM | POA: Diagnosis not present

## 2020-06-16 DIAGNOSIS — E291 Testicular hypofunction: Secondary | ICD-10-CM | POA: Diagnosis not present

## 2020-06-16 DIAGNOSIS — G20 Parkinson's disease: Secondary | ICD-10-CM | POA: Diagnosis not present

## 2020-06-16 DIAGNOSIS — N401 Enlarged prostate with lower urinary tract symptoms: Secondary | ICD-10-CM | POA: Diagnosis not present

## 2020-06-16 DIAGNOSIS — N529 Male erectile dysfunction, unspecified: Secondary | ICD-10-CM | POA: Diagnosis not present

## 2020-06-17 DIAGNOSIS — R293 Abnormal posture: Secondary | ICD-10-CM | POA: Diagnosis not present

## 2020-06-17 DIAGNOSIS — M79602 Pain in left arm: Secondary | ICD-10-CM | POA: Diagnosis not present

## 2020-06-17 DIAGNOSIS — M62838 Other muscle spasm: Secondary | ICD-10-CM | POA: Diagnosis not present

## 2020-06-17 DIAGNOSIS — M7712 Lateral epicondylitis, left elbow: Secondary | ICD-10-CM | POA: Diagnosis not present

## 2020-06-18 DIAGNOSIS — N1831 Chronic kidney disease, stage 3a: Secondary | ICD-10-CM | POA: Diagnosis not present

## 2020-06-18 DIAGNOSIS — Z794 Long term (current) use of insulin: Secondary | ICD-10-CM | POA: Diagnosis not present

## 2020-06-18 DIAGNOSIS — I1310 Hypertensive heart and chronic kidney disease without heart failure, with stage 1 through stage 4 chronic kidney disease, or unspecified chronic kidney disease: Secondary | ICD-10-CM | POA: Diagnosis not present

## 2020-06-18 DIAGNOSIS — E1129 Type 2 diabetes mellitus with other diabetic kidney complication: Secondary | ICD-10-CM | POA: Diagnosis not present

## 2020-06-21 ENCOUNTER — Emergency Department (HOSPITAL_COMMUNITY): Payer: Medicare Other

## 2020-06-21 ENCOUNTER — Inpatient Hospital Stay (HOSPITAL_COMMUNITY): Payer: Medicare Other

## 2020-06-21 ENCOUNTER — Encounter (HOSPITAL_COMMUNITY): Payer: Self-pay | Admitting: Emergency Medicine

## 2020-06-21 ENCOUNTER — Other Ambulatory Visit: Payer: Self-pay

## 2020-06-21 ENCOUNTER — Inpatient Hospital Stay (HOSPITAL_COMMUNITY)
Admission: EM | Admit: 2020-06-21 | Discharge: 2020-06-29 | DRG: 659 | Disposition: A | Discharge status: Hospice | Payer: Medicare Other | Attending: Internal Medicine | Admitting: Internal Medicine

## 2020-06-21 DIAGNOSIS — Z66 Do not resuscitate: Secondary | ICD-10-CM | POA: Diagnosis not present

## 2020-06-21 DIAGNOSIS — D519 Vitamin B12 deficiency anemia, unspecified: Secondary | ICD-10-CM

## 2020-06-21 DIAGNOSIS — I959 Hypotension, unspecified: Secondary | ICD-10-CM | POA: Diagnosis not present

## 2020-06-21 DIAGNOSIS — R32 Unspecified urinary incontinence: Secondary | ICD-10-CM | POA: Diagnosis present

## 2020-06-21 DIAGNOSIS — N400 Enlarged prostate without lower urinary tract symptoms: Secondary | ICD-10-CM | POA: Diagnosis not present

## 2020-06-21 DIAGNOSIS — N136 Pyonephrosis: Secondary | ICD-10-CM | POA: Diagnosis present

## 2020-06-21 DIAGNOSIS — F419 Anxiety disorder, unspecified: Secondary | ICD-10-CM | POA: Diagnosis present

## 2020-06-21 DIAGNOSIS — I469 Cardiac arrest, cause unspecified: Secondary | ICD-10-CM | POA: Diagnosis not present

## 2020-06-21 DIAGNOSIS — E538 Deficiency of other specified B group vitamins: Secondary | ICD-10-CM | POA: Diagnosis present

## 2020-06-21 DIAGNOSIS — E8729 Other acidosis: Secondary | ICD-10-CM | POA: Diagnosis present

## 2020-06-21 DIAGNOSIS — E559 Vitamin D deficiency, unspecified: Secondary | ICD-10-CM | POA: Diagnosis present

## 2020-06-21 DIAGNOSIS — R001 Bradycardia, unspecified: Secondary | ICD-10-CM | POA: Diagnosis not present

## 2020-06-21 DIAGNOSIS — Z20822 Contact with and (suspected) exposure to covid-19: Secondary | ICD-10-CM | POA: Diagnosis present

## 2020-06-21 DIAGNOSIS — N1330 Unspecified hydronephrosis: Secondary | ICD-10-CM | POA: Diagnosis not present

## 2020-06-21 DIAGNOSIS — G20 Parkinson's disease: Secondary | ICD-10-CM | POA: Diagnosis present

## 2020-06-21 DIAGNOSIS — Z91013 Allergy to seafood: Secondary | ICD-10-CM

## 2020-06-21 DIAGNOSIS — K219 Gastro-esophageal reflux disease without esophagitis: Secondary | ICD-10-CM | POA: Diagnosis present

## 2020-06-21 DIAGNOSIS — E872 Acidosis: Secondary | ICD-10-CM | POA: Diagnosis not present

## 2020-06-21 DIAGNOSIS — N390 Urinary tract infection, site not specified: Secondary | ICD-10-CM

## 2020-06-21 DIAGNOSIS — E870 Hyperosmolality and hypernatremia: Secondary | ICD-10-CM | POA: Diagnosis not present

## 2020-06-21 DIAGNOSIS — N201 Calculus of ureter: Secondary | ICD-10-CM | POA: Diagnosis not present

## 2020-06-21 DIAGNOSIS — R531 Weakness: Secondary | ICD-10-CM

## 2020-06-21 DIAGNOSIS — E440 Moderate protein-calorie malnutrition: Secondary | ICD-10-CM | POA: Diagnosis present

## 2020-06-21 DIAGNOSIS — Z794 Long term (current) use of insulin: Secondary | ICD-10-CM

## 2020-06-21 DIAGNOSIS — E785 Hyperlipidemia, unspecified: Secondary | ICD-10-CM | POA: Diagnosis present

## 2020-06-21 DIAGNOSIS — N179 Acute kidney failure, unspecified: Principal | ICD-10-CM

## 2020-06-21 DIAGNOSIS — D509 Iron deficiency anemia, unspecified: Secondary | ICD-10-CM | POA: Diagnosis present

## 2020-06-21 DIAGNOSIS — T17320A Food in larynx causing asphyxiation, initial encounter: Secondary | ICD-10-CM | POA: Diagnosis not present

## 2020-06-21 DIAGNOSIS — E860 Dehydration: Secondary | ICD-10-CM | POA: Diagnosis present

## 2020-06-21 DIAGNOSIS — R627 Adult failure to thrive: Secondary | ICD-10-CM | POA: Diagnosis present

## 2020-06-21 DIAGNOSIS — N132 Hydronephrosis with renal and ureteral calculous obstruction: Secondary | ICD-10-CM | POA: Diagnosis not present

## 2020-06-21 DIAGNOSIS — D539 Nutritional anemia, unspecified: Secondary | ICD-10-CM | POA: Diagnosis present

## 2020-06-21 DIAGNOSIS — Z96651 Presence of right artificial knee joint: Secondary | ICD-10-CM | POA: Diagnosis present

## 2020-06-21 DIAGNOSIS — I6782 Cerebral ischemia: Secondary | ICD-10-CM | POA: Diagnosis not present

## 2020-06-21 DIAGNOSIS — R1310 Dysphagia, unspecified: Secondary | ICD-10-CM | POA: Diagnosis not present

## 2020-06-21 DIAGNOSIS — K861 Other chronic pancreatitis: Secondary | ICD-10-CM | POA: Diagnosis not present

## 2020-06-21 DIAGNOSIS — Z96659 Presence of unspecified artificial knee joint: Secondary | ICD-10-CM | POA: Diagnosis not present

## 2020-06-21 DIAGNOSIS — R0902 Hypoxemia: Secondary | ICD-10-CM

## 2020-06-21 DIAGNOSIS — E119 Type 2 diabetes mellitus without complications: Secondary | ICD-10-CM

## 2020-06-21 DIAGNOSIS — I10 Essential (primary) hypertension: Secondary | ICD-10-CM | POA: Diagnosis not present

## 2020-06-21 DIAGNOSIS — R9082 White matter disease, unspecified: Secondary | ICD-10-CM | POA: Diagnosis not present

## 2020-06-21 DIAGNOSIS — Z515 Encounter for palliative care: Secondary | ICD-10-CM

## 2020-06-21 DIAGNOSIS — R159 Full incontinence of feces: Secondary | ICD-10-CM | POA: Diagnosis present

## 2020-06-21 DIAGNOSIS — J690 Pneumonitis due to inhalation of food and vomit: Secondary | ICD-10-CM | POA: Diagnosis present

## 2020-06-21 DIAGNOSIS — Z7189 Other specified counseling: Secondary | ICD-10-CM | POA: Diagnosis not present

## 2020-06-21 DIAGNOSIS — N3289 Other specified disorders of bladder: Secondary | ICD-10-CM | POA: Diagnosis not present

## 2020-06-21 DIAGNOSIS — Z79899 Other long term (current) drug therapy: Secondary | ICD-10-CM

## 2020-06-21 DIAGNOSIS — R05 Cough: Secondary | ICD-10-CM | POA: Diagnosis not present

## 2020-06-21 DIAGNOSIS — K5900 Constipation, unspecified: Secondary | ICD-10-CM | POA: Diagnosis present

## 2020-06-21 DIAGNOSIS — E876 Hypokalemia: Secondary | ICD-10-CM | POA: Clinically undetermined

## 2020-06-21 DIAGNOSIS — G20A1 Parkinson's disease without dyskinesia, without mention of fluctuations: Secondary | ICD-10-CM | POA: Diagnosis present

## 2020-06-21 DIAGNOSIS — Z6820 Body mass index (BMI) 20.0-20.9, adult: Secondary | ICD-10-CM

## 2020-06-21 DIAGNOSIS — G319 Degenerative disease of nervous system, unspecified: Secondary | ICD-10-CM | POA: Diagnosis not present

## 2020-06-21 DIAGNOSIS — Q600 Renal agenesis, unilateral: Secondary | ICD-10-CM | POA: Diagnosis not present

## 2020-06-21 DIAGNOSIS — G9389 Other specified disorders of brain: Secondary | ICD-10-CM | POA: Diagnosis not present

## 2020-06-21 LAB — COMPREHENSIVE METABOLIC PANEL
ALT: 8 U/L (ref 0–44)
AST: 14 U/L — ABNORMAL LOW (ref 15–41)
Albumin: 3.6 g/dL (ref 3.5–5.0)
Alkaline Phosphatase: 179 U/L — ABNORMAL HIGH (ref 38–126)
Anion gap: 15 (ref 5–15)
BUN: 81 mg/dL — ABNORMAL HIGH (ref 8–23)
CO2: 10 mmol/L — ABNORMAL LOW (ref 22–32)
Calcium: 6.1 mg/dL — CL (ref 8.9–10.3)
Chloride: 117 mmol/L — ABNORMAL HIGH (ref 98–111)
Creatinine, Ser: 4.76 mg/dL — ABNORMAL HIGH (ref 0.61–1.24)
GFR calc Af Amer: 13 mL/min — ABNORMAL LOW (ref >60)
GFR calc non Af Amer: 11 mL/min — ABNORMAL LOW (ref >60)
Glucose, Bld: 102 mg/dL — ABNORMAL HIGH (ref 70–99)
Potassium: 4.3 mmol/L (ref 3.5–5.1)
Sodium: 142 mmol/L (ref 135–145)
Total Bilirubin: 0.2 mg/dL — ABNORMAL LOW (ref 0.3–1.2)
Total Protein: 6.6 g/dL (ref 6.5–8.1)

## 2020-06-21 LAB — CBC WITH DIFFERENTIAL/PLATELET
Abs Immature Granulocytes: 0.06 10*3/uL (ref 0.00–0.07)
Basophils Absolute: 0 10*3/uL (ref 0.0–0.1)
Basophils Relative: 0 %
Eosinophils Absolute: 0 10*3/uL (ref 0.0–0.5)
Eosinophils Relative: 0 %
HCT: 24.6 % — ABNORMAL LOW (ref 39.0–52.0)
Hemoglobin: 8.2 g/dL — ABNORMAL LOW (ref 13.0–17.0)
Immature Granulocytes: 0 %
Lymphocytes Relative: 4 %
Lymphs Abs: 0.6 10*3/uL — ABNORMAL LOW (ref 0.7–4.0)
MCH: 33.9 pg (ref 26.0–34.0)
MCHC: 33.3 g/dL (ref 30.0–36.0)
MCV: 101.7 fL — ABNORMAL HIGH (ref 80.0–100.0)
Monocytes Absolute: 1.1 10*3/uL — ABNORMAL HIGH (ref 0.1–1.0)
Monocytes Relative: 8 %
Neutro Abs: 11.9 10*3/uL — ABNORMAL HIGH (ref 1.7–7.7)
Neutrophils Relative %: 88 %
Platelets: 167 10*3/uL (ref 150–400)
RBC: 2.42 MIL/uL — ABNORMAL LOW (ref 4.22–5.81)
RDW: 14.3 % (ref 11.5–15.5)
WBC: 13.7 10*3/uL — ABNORMAL HIGH (ref 4.0–10.5)
nRBC: 0 % (ref 0.0–0.2)

## 2020-06-21 LAB — URINALYSIS, ROUTINE W REFLEX MICROSCOPIC
Bilirubin Urine: NEGATIVE
Glucose, UA: NEGATIVE mg/dL
Ketones, ur: NEGATIVE mg/dL
Nitrite: NEGATIVE
Protein, ur: 30 mg/dL — AB
Specific Gravity, Urine: 1.009 (ref 1.005–1.030)
WBC, UA: >50 WBC/hpf — ABNORMAL HIGH (ref 0–5)
pH: 5 (ref 5.0–8.0)

## 2020-06-21 LAB — SARS CORONAVIRUS 2 BY RT PCR (HOSPITAL ORDER, PERFORMED IN ~~LOC~~ HOSPITAL LAB): SARS Coronavirus 2: NEGATIVE

## 2020-06-21 LAB — GLUCOSE, CAPILLARY: Glucose-Capillary: 71 mg/dL (ref 70–99)

## 2020-06-21 MED ORDER — PANCRELIPASE (LIP-PROT-AMYL) 12000-38000 UNITS PO CPEP
36000.0000 [IU] | ORAL_CAPSULE | ORAL | Status: DC | PRN
  Filled 2020-06-21: qty 3

## 2020-06-21 MED ORDER — INSULIN ASPART 100 UNIT/ML ~~LOC~~ SOLN
0.0000 [IU] | Freq: Three times a day (TID) | SUBCUTANEOUS | Status: DC
  Administered 2020-06-22: 5 [IU] via SUBCUTANEOUS
  Administered 2020-06-23: 3 [IU] via SUBCUTANEOUS
  Administered 2020-06-23 (×2): 2 [IU] via SUBCUTANEOUS
  Administered 2020-06-24: 5 [IU] via SUBCUTANEOUS
  Administered 2020-06-24: 2 [IU] via SUBCUTANEOUS
  Administered 2020-06-25: 7 [IU] via SUBCUTANEOUS
  Filled 2020-06-21: qty 0.09

## 2020-06-21 MED ORDER — SODIUM CHLORIDE 0.9 % IV SOLN
1.0000 g | Freq: Once | INTRAVENOUS | Status: AC
  Administered 2020-06-21: 1 g via INTRAVENOUS
  Filled 2020-06-21: qty 10

## 2020-06-21 MED ORDER — VENLAFAXINE HCL ER 75 MG PO CP24
225.0000 mg | ORAL_CAPSULE | Freq: Every day | ORAL | Status: DC
  Administered 2020-06-22 – 2020-06-29 (×8): 225 mg via ORAL
  Filled 2020-06-21: qty 1
  Filled 2020-06-21: qty 3
  Filled 2020-06-21 (×3): qty 1
  Filled 2020-06-21: qty 3
  Filled 2020-06-21: qty 1
  Filled 2020-06-21: qty 3

## 2020-06-21 MED ORDER — SODIUM CHLORIDE 0.9 % IV BOLUS
1000.0000 mL | Freq: Once | INTRAVENOUS | Status: AC
  Administered 2020-06-21: 1000 mL via INTRAVENOUS

## 2020-06-21 MED ORDER — ACETAMINOPHEN 650 MG RE SUPP: 650.0000 mg | Freq: Four times a day (QID) | RECTAL | Status: DC | PRN

## 2020-06-21 MED ORDER — INSULIN ASPART 100 UNIT/ML ~~LOC~~ SOLN
0.0000 [IU] | Freq: Every day | SUBCUTANEOUS | Status: DC
  Administered 2020-06-22: 2 [IU] via SUBCUTANEOUS
  Administered 2020-06-24: 5 [IU] via SUBCUTANEOUS
  Administered 2020-06-28: 0 [IU] via SUBCUTANEOUS
  Filled 2020-06-21: qty 0.05

## 2020-06-21 MED ORDER — SODIUM CHLORIDE 0.9 % IV SOLN: INTRAVENOUS | Status: DC

## 2020-06-21 MED ORDER — HEPARIN SODIUM (PORCINE) 5000 UNIT/ML IJ SOLN
5000.0000 [IU] | Freq: Three times a day (TID) | INTRAMUSCULAR | Status: DC
  Administered 2020-06-21 – 2020-06-23 (×5): 5000 [IU] via SUBCUTANEOUS
  Filled 2020-06-21 (×5): qty 1

## 2020-06-21 MED ORDER — SODIUM CHLORIDE 0.9 % IV SOLN
1.0000 g | INTRAVENOUS | Status: DC
  Administered 2020-06-22 – 2020-06-24 (×3): 1 g via INTRAVENOUS
  Filled 2020-06-21: qty 1
  Filled 2020-06-21: qty 10
  Filled 2020-06-21: qty 1

## 2020-06-21 MED ORDER — ROPINIROLE HCL 1 MG PO TABS
2.0000 mg | ORAL_TABLET | Freq: Three times a day (TID) | ORAL | Status: DC
  Administered 2020-06-21: 2 mg via ORAL
  Filled 2020-06-21 (×2): qty 2

## 2020-06-21 MED ORDER — ACETAMINOPHEN 325 MG PO TABS
650.0000 mg | ORAL_TABLET | Freq: Four times a day (QID) | ORAL | Status: DC | PRN
  Administered 2020-06-23 – 2020-06-25 (×2): 650 mg via ORAL
  Filled 2020-06-21 (×2): qty 2

## 2020-06-21 MED ORDER — DONEPEZIL HCL 5 MG PO TABS
5.0000 mg | ORAL_TABLET | Freq: Every day | ORAL | Status: DC
  Administered 2020-06-21 – 2020-06-28 (×8): 5 mg via ORAL
  Filled 2020-06-21 (×9): qty 1

## 2020-06-21 MED ORDER — CALCIUM GLUCONATE 10 % IV SOLN: 1.0000 g | Freq: Once | INTRAVENOUS | Status: DC

## 2020-06-21 MED ORDER — CARBIDOPA-LEVODOPA ER 25-100 MG PO TBCR
1.5000 | EXTENDED_RELEASE_TABLET | Freq: Three times a day (TID) | ORAL | Status: DC
  Administered 2020-06-21 – 2020-06-29 (×22): 1.5 via ORAL
  Filled 2020-06-21 (×26): qty 1.5

## 2020-06-21 MED ORDER — CALCIUM GLUCONATE-NACL 1-0.675 GM/50ML-% IV SOLN
1.0000 g | Freq: Once | INTRAVENOUS | Status: AC
  Administered 2020-06-21: 1000 mg via INTRAVENOUS
  Filled 2020-06-21: qty 50

## 2020-06-21 MED ORDER — PANCRELIPASE (LIP-PROT-AMYL) 12000-38000 UNITS PO CPEP
36000.0000 [IU] | ORAL_CAPSULE | Freq: Three times a day (TID) | ORAL | Status: DC
  Administered 2020-06-23 – 2020-06-28 (×8): 36000 [IU] via ORAL
  Filled 2020-06-21: qty 3
  Filled 2020-06-21: qty 1
  Filled 2020-06-21: qty 3
  Filled 2020-06-21 (×2): qty 1
  Filled 2020-06-21 (×2): qty 3
  Filled 2020-06-21: qty 1
  Filled 2020-06-21: qty 3
  Filled 2020-06-21 (×4): qty 1
  Filled 2020-06-21 (×2): qty 3
  Filled 2020-06-21: qty 1

## 2020-06-21 MED ORDER — ROPINIROLE HCL ER 6 MG PO TB24: 6.0000 mg | ORAL_TABLET | Freq: Every day | ORAL | Status: DC

## 2020-06-21 NOTE — ED Triage Notes (Addendum)
Per EMS, patient from home, family reports worsening weakness and difficulty swallowing today. Hx Parkinson's. States patient is supposed to be admitted tomorrow to hospice for end of life care but family did not feel comfortable keeping him at home over night. [facility]   20g L FA

## 2020-06-21 NOTE — ED Notes (Signed)
US at bedside

## 2020-06-21 NOTE — ED Provider Notes (Signed)
[facility] DEPT Provider Note   CSN: [id number] Arrival date & time: 06/21/20  1208     History Chief Complaint  Patient presents with  . Weakness    Randall [name] is a 79 y.o. male.  HPI  Due to a level 5 caveat Parkinson's HPI will be deferred and will be collected from patient's wife Randall [name] who was at bedside.    Patient presents emergency department with chief complaint of increasing weakness and difficulty swallowing.  Patient's wife explains that over the last 5 days the patient had increasingly become weaker and has had decreased oral intake.  She explains that the patient has extreme difficulty getting up from a sitting position and he becomes very tired and has increased labored breathing when he tries to do so.  She also admits that the patient has difficulty swallowing food and liquids and fears that the patient is a aspiration risk. Wife explains that she has had a hospice nurse who has come and evaluate the patient explained that the patient will be going to hospice tomorrow but does not feel she is able to handle him until that time.  Patient has significant medical history of anxiety, arthritis, diabetes, Parkinson's disease.  Patient states he is currently has complaints at this time, he denies headache, fever, chills, sore throat, chest pain, shortness of breath, abdominal pain, nausea, vomiting, dysuria, pedal edema.  Past Medical History:  Diagnosis Date  . Anxiety   . Arthritis   . Diabetes mellitus without complication ([place])   . GERD (gastroesophageal reflux disease)   . Hyperlipidemia   . Incontinence of urine    DR  RON   DAVIS    LOV FEB. 2015  . Neuromuscular disorder ([place])   . Parkinson disease ([place])    Just replaced battery 06/2019  . RSD (reflex sympathetic dystrophy)    2005    Patient Active Problem List   Diagnosis Date Noted  . ARF (acute renal failure) ([place]) 06/21/2020  . Wound infection after surgery 03/09/2014   . 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 Hyperlipidemia   . Incontinence of urine   . Parkinson disease ([place]) 02/21/2014  . 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 Diabetes ([place]) 02/19/2014  . 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 APPENDECTOMY    . BRAIN SURGERY     deep brain stimulation for parkinsons  . CARDIAC CATHETERIZATION     2005  . COLON SURGERY     resection diverticulitis  . DEEP BRAIN STIMULATOR PLACEMENT     replaced battery 06/2019  . TOTAL KNEE ARTHROPLASTY Right 02/19/2014   Procedure: TOTAL KNEE ARTHROPLASTY;  Surgeon: Hessie Dibble, MD;  Location: [facility];  Service: Orthopedics;  Laterality: Right;       Family History  Problem Relation Age of Onset  . Colon cancer Neg Hx   . Esophageal cancer Neg Hx   . Stomach cancer Neg Hx   . Pancreatic cancer Neg Hx     Social History   Tobacco Use  . Smoking status: Never Smoker  . Smokeless tobacco: Never Used  Vaping Use  . Vaping Use: Never used  Substance Use Topics  . Alcohol use: Yes    Alcohol/week: 20.0 standard drinks    Types: 20 Cans of beer per week    Comment: 1-2 beers a day  .  Drug use: No    Home Medications Prior to Admission medications   Medication Sig Start Date End Date Taking? Authorizing Provider  Carbidopa-Levodopa ER (SINEMET CR) 25-100 MG tablet controlled release Take 1.5 tablets by mouth 3 (three) times daily.    [provider]  CREON 36000-114000 units CPEP capsule TAKE 2 CAPSULES WITH EACH MEAL AND 1 CAPSULE WITH A SNACK-UP TO 7 CAPS PER DAY 06/06/20   Nelida Meuse III, MD  donepezil (ARICEPT) 5 MG tablet Take 5 mg by mouth at bedtime. 09/25/14   [provider]  fesoterodine (TOVIAZ) 8 MG TB24 tablet Take 8 mg by mouth daily.    [provider]  insulin glargine (LANTUS) 100 unit/mL SOPN Inject into the skin as  directed. Will take 30 in the morning and 24 at nighttime. Will decrease or increase depending on sugar level    [provider]  insulin lispro (HUMALOG) 100 UNIT/ML injection Inject 5 Units into the skin as needed for high blood sugar.    [provider]  rOPINIRole (REQUIP XL) 8 MG 24 hr tablet Take 8 mg by mouth every morning. Treats Parkinson's disease    [provider]  simvastatin (ZOCOR) 40 MG tablet Take 40 mg by mouth daily. Lowers cholesterol    [provider]  venlafaxine XR (EFFEXOR XR) 75 MG 24 hr capsule Take 225 mg by mouth daily with breakfast.     [provider]    Allergies    Iodine and Shellfish allergy  Review of Systems   Review of Systems  Physical Exam Updated Vital Signs BP (!) 125/54   Pulse 83   Temp 97.6 F (36.4 C) (Oral)   Resp 17   SpO2 100%   Physical Exam Vitals and nursing note reviewed.  Constitutional:      General: He is not in acute distress.    Appearance: He is not ill-appearing.  HENT:     Head: Normocephalic and atraumatic.     Nose: No congestion.     Mouth/Throat:     Mouth: Mucous membranes are dry.     Pharynx: Oropharynx is clear. No oropharyngeal exudate or posterior oropharyngeal erythema.  Eyes:     General: No scleral icterus.    Extraocular Movements: Extraocular movements intact.     Pupils: Pupils are equal, round, and reactive to light.  Cardiovascular:     Rate and Rhythm: Normal rate and regular rhythm.     Pulses: Normal pulses.     Heart sounds: No murmur heard.  No friction rub. No gallop.   Pulmonary:     Effort: No respiratory distress.     Breath sounds: No stridor. No wheezing, rhonchi or rales.  Chest:     Chest wall: No tenderness.  Abdominal:     General: There is no distension.     Palpations: Abdomen is soft. There is no mass.     Tenderness: There is no abdominal tenderness. There is no guarding or rebound.  Musculoskeletal:        General: No  swelling or tenderness.     Right lower leg: No edema.     Left lower leg: No edema.  Skin:    General: Skin is warm and dry.     Capillary Refill: Capillary refill takes less than 2 seconds.     Findings: No rash.  Neurological:     General: No focal deficit present.     Mental Status: He is alert.  Psychiatric:        Mood and Affect: Mood normal.     ED Results / Procedures / Treatments   Labs (all labs ordered are listed, but only abnormal results are displayed) Labs Reviewed  COMPREHENSIVE METABOLIC PANEL - Abnormal; Notable for the following components:      Result Value   Chloride 117 (*)    CO2 10 (*)    Glucose, Bld 102 (*)    BUN 81 (*)    Creatinine, Ser 4.76 (*)    Calcium 6.1 (*)    AST 14 (*)    Alkaline Phosphatase 179 (*)    Total Bilirubin 0.2 (*)    GFR calc non Af Amer 11 (*)    GFR calc Af Amer 13 (*)    All other components within normal limits  CBC WITH DIFFERENTIAL/PLATELET - Abnormal; Notable for the following components:   WBC 13.7 (*)    RBC 2.42 (*)    Hemoglobin 8.2 (*)    HCT 24.6 (*)    MCV 101.7 (*)    Neutro Abs 11.9 (*)    Lymphs Abs 0.6 (*)    Monocytes Absolute 1.1 (*)    All other components within normal limits  URINALYSIS, ROUTINE W REFLEX MICROSCOPIC - Abnormal; Notable for the following components:   APPearance CLOUDY (*)    Hgb urine dipstick LARGE (*)    Protein, ur 30 (*)    Leukocytes,Ua LARGE (*)    WBC, UA >50 (*)    Bacteria, UA MANY (*)    All other components within normal limits  SARS CORONAVIRUS 2 BY RT PCR (HOSPITAL ORDER, [facility])  URINE CULTURE    EKG None  Radiology DG Chest 2 View  Result Date: 06/21/2020 CLINICAL DATA:  Worsening weakness and difficulty swallowing. Cough. EXAM: CHEST - 2 VIEW COMPARISON:  May 10, 2005 FINDINGS: The heart size and mediastinal contours are within normal limits. Both lungs are clear. The visualized skeletal structures are unremarkable.  IMPRESSION: No active cardiopulmonary disease. Electronically Signed   By: Fidela Salisbury M.D.   On: 06/21/2020 14:16   CT HEAD WO CONTRAST  Result Date: 06/21/2020 CLINICAL DATA:  Altered mental status. EXAM: CT HEAD WITHOUT CONTRAST TECHNIQUE: Contiguous axial images were obtained from the base of the skull through the vertex without intravenous contrast. COMPARISON:  04/01/2015. FINDINGS: Brain: Moderate to marked dilatation of the ventricles and moderate dilatation of the cortical sulci with progression. Mild to moderate patchy white matter low density in both cerebral hemispheres with progression. Interval right frontal electrode with its tip at the inferior aspect of the thalamus on the right. No intracranial hemorrhage, mass lesion or CT evidence of acute infarction. Vascular: No hyperdense vessel or unexpected calcification. Skull: Normal. Negative for fracture or focal lesion. Sinuses/Orbits: Status post bilateral cataract extraction. Unremarkable bones and included paranasal sinuses. Other: None. IMPRESSION: 1. No acute abnormality. 2. Moderate to marked diffuse cerebral and cerebellar atrophy with progression. 3. Mild to moderate chronic small vessel white matter ischemic changes in both cerebral hemispheres with progression. 4. Interval right frontal electrode with its tip at the inferior aspect of the thalamus on the right. Electronically Signed   By: Claudie Revering M.D.   On: 06/21/2020 14:31    Procedures Procedures (including critical care time)  Medications Ordered in ED Medications  sodium chloride 0.9 % bolus 1,000 mL (has no administration in time range)  cefTRIAXone (ROCEPHIN) 1 g in sodium chloride  0.9 % 100 mL IVPB (has no administration in time range)  calcium gluconate 1 g/ 50 mL sodium chloride IVPB (has no administration in time range)    ED Course  I have reviewed the triage vital signs and the nursing notes.  Pertinent labs & imaging results that were available  during my care of the patient were reviewed by me and considered in my medical decision making (see chart for details).    MDM Rules/Calculators/A&P                          I have personally reviewed all imaging, labs and have interpreted them.  Due to patient's complaint most concern for metabolic abnormality versus systemic infection versus cardiac abnormality.  Unlikely patient suffering from a cardiac abnormality as patient denies chest pain, shortness of breath, patient's vital signs are reassuring, good radial and pedal pulses, no signs of hypoperfusion.  Patient CMP showed elevated bun of 81, creatinine of 4.76, calcium of 6.1, alk phos 179, GFR 11.  Possibly secondary to malnutrition versus urinary obstruction.  Unlikely urinary obstruction as bladder scan showed 183 cc.  This is most likely secondary to malnutrition as patient's wife stated patient has not been eating or drinking for the last 6 days.  UA shows large leukocytes, many white blood cells, many bacteria which is indicative of a UTI, this correlates with CBC which shows leukocytosis.  I have low suspicion for pyelonephritis as patient denies flank pain, nausea, vomiting, vital signs are reassuring.,  Patient nontoxic-appearing.  CBC also shows macrocytic anemia which appears to be baseline for this patient.    Will consult hospitalist team for admission due to metabolic abnormalities as well as UTI.  Spoke with Dr. Wyline Copas who will come evaluate the patient and admit the patient.  Likely patient suffering from metabolic abnormality secondary to failure to thrive as well as a UTI.  Patient care will be transferred over to hospitalist team. Final Clinical Impression(s) / ED Diagnoses Final diagnoses:  AKI (acute kidney injury) ([place])  Hypocalcemia  Acute UTI  Weakness    Rx / DC Orders ED Discharge Orders    None       Marcello Fennel, PA-C 06/21/20 1637    Lajean Saver, MD 06/22/20 1310

## 2020-06-21 NOTE — ED Notes (Signed)
Wife at bedside.

## 2020-06-21 NOTE — ED Notes (Signed)
Patient transported to X-ray 

## 2020-06-21 NOTE — ED Notes (Addendum)
Date and time results received: 06/21/20 3:03 PM  Test: Calcium Critical Value: 6.1  Name of Provider Notified: Chrissie Noa PA  Orders Received? Or Actions Taken?: Awaiting further orders

## 2020-06-21 NOTE — ED Notes (Signed)
Patients wife stated that an EKG could not be obtained due to the patient having a Deep Brain Stimulator and cannot be shut off .

## 2020-06-21 NOTE — ED Notes (Signed)
Bladder scan showed 182 ml of urine in bladder.

## 2020-06-21 NOTE — ED Notes (Signed)
Patient transported to CT 

## 2020-06-21 NOTE — H&P (Signed)
History and Physical    Randall [name] ZDG:[id number] DOB: September 23, 1941 DOA: 06/21/2020  PCP: Gaspar Garbe, MD  Patient coming from: Home  Chief Complaint: Weakness  HPI: Randall [name] is a 79 y.o. male with medical history significant of parkinson's disease, DM2, arthritis who presents to the ED with concerns of increased weakness and poor PO intake. Pt is somewhat confused at this time. Majority of history was obtained from pt's wife who is at bedside. Per family , pt had been in his usual state of health until 5 days prior to admit, when he acutely lost his appetite and had only minimal amounts of PO intake. Decreased appetite continued and patient soon lost the majority of his strength, ultimately being unable to stand even with assistance one day prior to admit. Pt has been complaining of nausea during this time and pt's wife reports witnessing aspiration event while pt was drinking fluids on the morning of admission. Pt denies coughing or chest pains.   Of note, patient's wife is currently in the process of establishing pt with home hospice services through "friends."   ED Course: In the ED, pt noted to have BUN/Cr of 81 and 4.76, respectively. WBC noted to be 13.7. Bladder scan revealed around 180cc of urine. UA was notable for large leukocytes, many bacteria, and large blood. CXR was obtained, reviewed, and was found to be clear. Pt was started empirically on rocephin and 1LNS bolus was given. Hospitalist consulted for consideration for admission.  Review of Systems:  Review of Systems  Constitutional: Positive for malaise/fatigue. Negative for chills, fever and weight loss.  HENT: Negative for congestion, ear discharge, ear pain and tinnitus.   Eyes: Negative for photophobia, pain and discharge.  Respiratory: Negative for sputum production, shortness of breath and wheezing.   Cardiovascular: Negative for chest pain, orthopnea and leg swelling.  Gastrointestinal: Positive for  nausea. Negative for abdominal pain, melena and vomiting.  Genitourinary: Negative for flank pain, hematuria and urgency.  Musculoskeletal: Negative for back pain, falls and neck pain.  Neurological: Negative for tremors, sensory change, seizures, loss of consciousness and weakness.  Psychiatric/Behavioral: Negative for hallucinations and substance abuse. The patient does not have insomnia.     Past Medical History:  Diagnosis Date  . Anxiety   . Arthritis   . Diabetes mellitus without complication (HCC)   . GERD (gastroesophageal reflux disease)   . Hyperlipidemia   . Incontinence of urine    DR  RON   DAVIS    LOV FEB. 2015  . Neuromuscular disorder (HCC)   . Parkinson disease (HCC)    Just replaced battery 06/2019  . RSD (reflex sympathetic dystrophy)    2005    Past Surgical History:  Procedure Laterality Date  . APPENDECTOMY    . BRAIN SURGERY     deep brain stimulation for parkinsons  . CARDIAC CATHETERIZATION     2005  . COLON SURGERY     resection diverticulitis  . DEEP BRAIN STIMULATOR PLACEMENT     replaced battery 06/2019  . TOTAL KNEE ARTHROPLASTY Right 02/19/2014   Procedure: TOTAL KNEE ARTHROPLASTY;  Surgeon: Velna Ochs, MD;  Location: [facility] OR;  Service: Orthopedics;  Laterality: Right;     reports that he has never smoked. He has never used smokeless tobacco. He reports current alcohol use of about 20.0 standard drinks of alcohol per week. He reports that he does not use drugs.  Allergies  Allergen Reactions  . Iodine Nausea And  Vomiting    CT trace   . Shellfish Allergy Nausea And Vomiting    Family History  Problem Relation Age of Onset  . Colon cancer Neg Hx   . Esophageal cancer Neg Hx   . Stomach cancer Neg Hx   . Pancreatic cancer Neg Hx     Prior to Admission medications   Medication Sig Start Date End Date Taking? Authorizing Provider  Carbidopa-Levodopa ER (SINEMET CR) 25-100 MG tablet controlled release Take 1.5 tablets by mouth 3  (three) times daily.    [provider]  CREON 36000-114000 units CPEP capsule TAKE 2 CAPSULES WITH EACH MEAL AND 1 CAPSULE WITH A SNACK-UP TO 7 CAPS PER DAY 06/06/20   [name], MD  donepezil (ARICEPT) 5 MG tablet Take 5 mg by mouth at bedtime. 09/25/14   [provider]  fesoterodine (TOVIAZ) 8 MG TB24 tablet Take 8 mg by mouth daily.    [provider]  insulin glargine (LANTUS) 100 unit/mL SOPN Inject into the skin as directed. Will take 30 in the morning and 24 at nighttime. Will decrease or increase depending on sugar level    [provider]  insulin lispro (HUMALOG) 100 UNIT/ML injection Inject 5 Units into the skin as needed for high blood sugar.    [provider]  rOPINIRole (REQUIP XL) 8 MG 24 hr tablet Take 8 mg by mouth every morning. Treats Parkinson's disease    [provider]  simvastatin (ZOCOR) 40 MG tablet Take 40 mg by mouth daily. Lowers cholesterol    [provider]  venlafaxine XR (EFFEXOR XR) 75 MG 24 hr capsule Take 225 mg by mouth daily with breakfast.     [provider]    Physical Exam: Vitals:   06/21/20 1242 06/21/20 1330 06/21/20 1430 06/21/20 1630  BP: 138/60 135/61 (!) 125/54 135/63  Pulse: 87 78 83 83  Resp:  20 17 16   Temp: 97.6 F (36.4 C)     TempSrc: Oral     SpO2: 100% 100% 100% 100%    Constitutional: NAD, calm, comfortable Vitals:   06/21/20 1242 06/21/20 1330 06/21/20 1430 06/21/20 1630  BP: 138/60 135/61 (!) 125/54 135/63  Pulse: 87 78 83 83  Resp:  20 17 16   Temp: 97.6 F (36.4 C)     TempSrc: Oral     SpO2: 100% 100% 100% 100%   Eyes: PERRL, lids and conjunctivae normal ENMT: Mucous membranes are very dry, Posterior pharynx clear of any exudate or lesions.fair dentition.  Neck: normal, supple, no masses Respiratory: clear to auscultation bilaterally, no wheezing Normal respiratory effort. No accessory muscle use.  Cardiovascular: Regular rate and  rhythm, s1, s2  Abdomen: no tenderness, no masses palpated. Bowel sounds positive.  Musculoskeletal: no clubbing / cyanosis. No joint deformity upper and lower extremities. Normal muscle tone.  Skin: no rashes, lesions No induration, poor skin turgor Neurologic: CN 2-12 grossly intact. Sensation intact,   Psychiatric: Normal judgment and insight. Alert and oriented x 3. Normal mood.    Labs on Admission: I have personally reviewed following labs and imaging studies  CBC: Recent Labs  Lab 06/21/20 1312  WBC 13.7*  NEUTROABS 11.9*  HGB 8.2*  HCT 24.6*  MCV 101.7*  PLT 167   Basic Metabolic Panel: Recent Labs  Lab 06/21/20 1312  NA 142  K 4.3  CL 117*  CO2 10*  GLUCOSE 102*  BUN 81*  CREATININE 4.76*  CALCIUM 6.1*   GFR:  CrCl cannot be calculated (Unknown ideal weight.). Liver Function Tests: Recent Labs  Lab 06/21/20 1312  AST 14*  ALT 8  ALKPHOS 179*  BILITOT 0.2*  PROT 6.6  ALBUMIN 3.6   No results for input(s): LIPASE, AMYLASE in the last 168 hours. No results for input(s): AMMONIA in the last 168 hours. Coagulation Profile: No results for input(s): INR, PROTIME in the last 168 hours. Cardiac Enzymes: No results for input(s): CKTOTAL, CKMB, CKMBINDEX, TROPONINI in the last 168 hours. BNP (last 3 results) No results for input(s): PROBNP in the last 8760 hours. HbA1C: No results for input(s): HGBA1C in the last 72 hours. CBG: No results for input(s): GLUCAP in the last 168 hours. Lipid Profile: No results for input(s): CHOL, HDL, LDLCALC, TRIG, CHOLHDL, LDLDIRECT in the last 72 hours. Thyroid Function Tests: No results for input(s): TSH, T4TOTAL, FREET4, T3FREE, THYROIDAB in the last 72 hours. Anemia Panel: No results for input(s): VITAMINB12, FOLATE, FERRITIN, TIBC, IRON, RETICCTPCT in the last 72 hours. Urine analysis:    Component Value Date/Time   COLORURINE YELLOW 06/21/2020 1312   APPEARANCEUR CLOUDY (A) 06/21/2020 1312   LABSPEC 1.009  06/21/2020 1312   PHURINE 5.0 06/21/2020 1312   GLUCOSEU NEGATIVE 06/21/2020 1312   HGBUR LARGE (A) 06/21/2020 1312   BILIRUBINUR NEGATIVE 06/21/2020 1312   KETONESUR NEGATIVE 06/21/2020 1312   PROTEINUR 30 (A) 06/21/2020 1312   UROBILINOGEN 0.2 02/12/2014 1518   NITRITE NEGATIVE 06/21/2020 1312   LEUKOCYTESUR LARGE (A) 06/21/2020 1312   Sepsis Labs: !!!!!!!!!!!!!!!!!!!!!!!!!!!!!!!!!!!!!!!!!!!! @LABRCNTIP (procalcitonin:4,lacticidven:4) )No results found for this or any previous visit (from the past 240 hour(s)).   Radiological Exams on Admission: DG Chest 2 View  Result Date: 06/21/2020 CLINICAL DATA:  Worsening weakness and difficulty swallowing. Cough. EXAM: CHEST - 2 VIEW COMPARISON:  May 10, 2005 FINDINGS: The heart size and mediastinal contours are within normal limits. Both lungs are clear. The visualized skeletal structures are unremarkable. IMPRESSION: No active cardiopulmonary disease. Electronically Signed   By: May 12, 2005 M.D.   On: 06/21/2020 14:16   CT HEAD WO CONTRAST  Result Date: 06/21/2020 CLINICAL DATA:  Altered mental status. EXAM: CT HEAD WITHOUT CONTRAST TECHNIQUE: Contiguous axial images were obtained from the base of the skull through the vertex without intravenous contrast. COMPARISON:  04/01/2015. FINDINGS: Brain: Moderate to marked dilatation of the ventricles and moderate dilatation of the cortical sulci with progression. Mild to moderate patchy white matter low density in both cerebral hemispheres with progression. Interval right frontal electrode with its tip at the inferior aspect of the thalamus on the right. No intracranial hemorrhage, mass lesion or CT evidence of acute infarction. Vascular: No hyperdense vessel or unexpected calcification. Skull: Normal. Negative for fracture or focal lesion. Sinuses/Orbits: Status post bilateral cataract extraction. Unremarkable bones and included paranasal sinuses. Other: None. IMPRESSION: 1. No acute abnormality. 2.  Moderate to marked diffuse cerebral and cerebellar atrophy with progression. 3. Mild to moderate chronic small vessel white matter ischemic changes in both cerebral hemispheres with progression. 4. Interval right frontal electrode with its tip at the inferior aspect of the thalamus on the right. Electronically Signed   By: 04/03/2015 M.D.   On: 06/21/2020 14:31    Assessment/Plan Principal Problem:   ARF (acute renal failure) (HCC) Active Problems:   Diabetes (HCC)   Parkinson disease (HCC)   S/P total knee arthroplasty   Hyperlipidemia   Benign fibroma of prostate   Acute lower UTI   DNR (do not resuscitate)   1.  ARF 1. Presenting BUN/Cr elevated at 81 and 4.76, respectively, baseline Cr <1 2. Clinically dehydrated with decreased turgor, dry membranes with history of very limited PO intake 3. Bladder scan in ED with 182cc of urine 4. Will continue IVF as tolerated 5. Recheck bmet in AM 6. Will check renal US 2. UTI 1. Pt with UA showing >50 wbc, many bacteria, large blood, large leuks with elevated WBC of 13.7 2. One dose of rocephin given in ED, will continue 3. Follow blood and urine culture 4. Repeat cbc in AM 3. Possible aspiration pneumonitis 1. Pt had witnessed aspiration event on AM of admit while trying to drink fluids 2. CXR reviewed, clear 3. On rocephin for UTI above 4. Will keep NPO and consult SLP 4. DM2 1. Random glucose of 102 2. On insulin prior to admit 3. Given NPO status and very limited PO intake in setting of ARF, will continue only on sensitive scale SSI coverage as needed 4. Will check a1c 5. Parkinson's 1. Seems to be stable at this time 2. Cont home meds as tolerated 6. OA 1. Pt is s/p prior knee surgery 2. Will consult PT/OT 7. HLD 1. On statin prior to admit, will continue 8. Hx fibroma of prostate 1. [facility], last seen on 7/12 2. Would cont home regimen 9. Hypocalcemia 1. Presenting calcium of  6.1 2. Suspect secondary to marked malnutrition 3. Calcium gluconate IV given in ED 4. Repeat lytes in AM 10. End of Life 1. Family understands patient has progressive disease and has been working on trying to establish with home hospice through "friends" prior to admit 2. Given acute decompensation, will consult Palliative Care 3. DNR status confirmed with family  DVT prophylaxis: heparin subq  Code Status: DNR, confirmed with family in room Family Communication: Pt in room, family at bedside  Disposition Plan: Uncertain at this time  Consults called: Palliative Care Admission status: Inpatient as patient will need IVF for severe renal failure and IV abx for UTI   Rickey BarbaraStephen Analayah Brooke MD [facility] Pager On Amion  If 7PM-7AM, please contact night-coverage  06/21/2020, 5:28 PM

## 2020-06-22 DIAGNOSIS — Z66 Do not resuscitate: Secondary | ICD-10-CM

## 2020-06-22 DIAGNOSIS — E785 Hyperlipidemia, unspecified: Secondary | ICD-10-CM

## 2020-06-22 DIAGNOSIS — G20 Parkinson's disease: Secondary | ICD-10-CM

## 2020-06-22 LAB — COMPREHENSIVE METABOLIC PANEL
ALT: 7 U/L (ref 0–44)
AST: 11 U/L — ABNORMAL LOW (ref 15–41)
Albumin: 3.4 g/dL — ABNORMAL LOW (ref 3.5–5.0)
Alkaline Phosphatase: 160 U/L — ABNORMAL HIGH (ref 38–126)
Anion gap: 14 (ref 5–15)
BUN: 82 mg/dL — ABNORMAL HIGH (ref 8–23)
CO2: 8 mmol/L — ABNORMAL LOW (ref 22–32)
Calcium: 6.2 mg/dL — CL (ref 8.9–10.3)
Chloride: 121 mmol/L — ABNORMAL HIGH (ref 98–111)
Creatinine, Ser: 4.83 mg/dL — ABNORMAL HIGH (ref 0.61–1.24)
GFR calc Af Amer: 12 mL/min — ABNORMAL LOW (ref >60)
GFR calc non Af Amer: 11 mL/min — ABNORMAL LOW (ref >60)
Glucose, Bld: 83 mg/dL (ref 70–99)
Potassium: 4.4 mmol/L (ref 3.5–5.1)
Sodium: 143 mmol/L (ref 135–145)
Total Bilirubin: 0.4 mg/dL (ref 0.3–1.2)
Total Protein: 6.1 g/dL — ABNORMAL LOW (ref 6.5–8.1)

## 2020-06-22 LAB — CBC
HCT: 24 % — ABNORMAL LOW (ref 39.0–52.0)
Hemoglobin: 7.9 g/dL — ABNORMAL LOW (ref 13.0–17.0)
MCH: 33.8 pg (ref 26.0–34.0)
MCHC: 32.9 g/dL (ref 30.0–36.0)
MCV: 102.6 fL — ABNORMAL HIGH (ref 80.0–100.0)
Platelets: 164 10*3/uL (ref 150–400)
RBC: 2.34 MIL/uL — ABNORMAL LOW (ref 4.22–5.81)
RDW: 14.6 % (ref 11.5–15.5)
WBC: 14.4 10*3/uL — ABNORMAL HIGH (ref 4.0–10.5)
nRBC: 0 % (ref 0.0–0.2)

## 2020-06-22 LAB — URINE CULTURE

## 2020-06-22 LAB — GLUCOSE, CAPILLARY
Glucose-Capillary: 123 mg/dL — ABNORMAL HIGH (ref 70–99)
Glucose-Capillary: 172 mg/dL — ABNORMAL HIGH (ref 70–99)
Glucose-Capillary: 270 mg/dL — ABNORMAL HIGH (ref 70–99)
Glucose-Capillary: 242 mg/dL — ABNORMAL HIGH (ref 70–99)

## 2020-06-22 MED ORDER — CALCIUM GLUCONATE-NACL 1-0.675 GM/50ML-% IV SOLN
1.0000 g | Freq: Once | INTRAVENOUS | Status: AC
  Administered 2020-06-22: 1000 mg via INTRAVENOUS
  Filled 2020-06-22: qty 50

## 2020-06-22 MED ORDER — ROPINIROLE HCL 1 MG PO TABS
2.0000 mg | ORAL_TABLET | Freq: Every day | ORAL | Status: DC
  Administered 2020-06-22 – 2020-06-28 (×7): 2 mg via ORAL
  Filled 2020-06-22 (×7): qty 2

## 2020-06-22 MED ORDER — RESOURCE THICKENUP CLEAR PO POWD
ORAL | Status: DC | PRN
  Filled 2020-06-22: qty 125

## 2020-06-22 NOTE — Evaluation (Signed)
Clinical/Bedside Swallow Evaluation Patient Details  Name: ZIAH LEANDRO MRN: [id number] Date of Birth: May 20, 1941  Today's Date: 06/22/2020 Time: SLP Start Time (ACUTE ONLY): 1545 SLP Stop Time (ACUTE ONLY): 1615 SLP Time Calculation (min) (ACUTE ONLY): 30 min  Past Medical History:  Past Medical History:  Diagnosis Date   Anxiety    Arthritis    Diabetes mellitus without complication (HCC)    GERD (gastroesophageal reflux disease)    Hyperlipidemia    Incontinence of urine    DR  RON   DAVIS    LOV FEB. 2015   Neuromuscular disorder (HCC)    Parkinson disease (HCC)    Just replaced battery 06/2019   RSD (reflex sympathetic dystrophy)    2005   Past Surgical History:  Past Surgical History:  Procedure Laterality Date   APPENDECTOMY     BRAIN SURGERY     deep brain stimulation for parkinsons   CARDIAC CATHETERIZATION     2005   COLON SURGERY     resection diverticulitis   DEEP BRAIN STIMULATOR PLACEMENT     replaced battery 06/2019   TOTAL KNEE ARTHROPLASTY Right 02/19/2014   Procedure: TOTAL KNEE ARTHROPLASTY;  Surgeon: Velna Ochs, MD;  Location: [facility] OR;  Service: Orthopedics;  Laterality: Right;   HPI:  79 yo male adm to [facility] with AMS, progressive weakness over the last few days.  Since early July, pt's wife reports a decline in his function. Pt aspirated liquids on day of admission.  CXR  negative for acute change.  Swallow evaluation ordered.   Assessment / Plan / Recommendation Clinical Impression  Pt sitting in recliner upon SLP entering the room - obtained assistance to slide pt up in bed.   Pt noted to be leaning to the left.  Pt with mildly weak volitional cough, masked facies with flat speech.  Pt observed consuming ice, milkshake, cereal bar and water.  No overt indication of aspiration with po however pt appears with delayed swallow. Suspect pt's dysphagia is primary oral initiation which results in occasional premature spillage of liquids into  his airway.  Swallow was audible which may be due to cricopharyngeal function.   Mildly prolonged mastication of solids noted and pt benefited from tsp of milkshake to moisten solid and aid transiting.  Recommend regular diet (to allow pt to choose food items he can manage- softer) and thin.  Advised pt take medications with puree *start and follow with liquids* to decrease risk of aspirating tablet or liquids.  SLP Visit Diagnosis: Dysphagia, oral phase (R13.11)    Aspiration Risk  Mild aspiration risk    Diet Recommendation Regular;Thin liquid (prefer pt consume softer foods)   Liquid Administration via: Cup Medication Administration: Whole meds with puree Supervision: Full supervision/cueing for compensatory strategies;Staff to assist with self feeding Compensations: Slow rate;Small sips/bites (start intake with liquids)    Other  Recommendations Oral Care Recommendations: Oral care QID   Follow up Recommendations        Frequency and Duration min 1 x/week  1 week       Prognosis Prognosis for Safe Diet Advancement: Guarded Barriers to Reach Goals: Other (Comment) (medical diagnosis)      Swallow Study   General Date of Onset: 06/22/20 HPI: 79 yo male adm to [facility] with AMS, progressive weakness over the last few days.  Since early July, pt's wife reports a decline in his function. Pt aspirated liquids on day of admission.  CXR  negative for acute change.  Swallow evaluation ordered. Type of Study: Bedside Swallow Evaluation Previous Swallow Assessment: none completed per pt/spouse Diet Prior to this Study: NPO Temperature Spikes Noted: No Respiratory Status: Room air History of Recent Intubation: No Oral Cavity Assessment: Dry Oral Care Completed by SLP: Yes (SLP assisted pt to brush his teeth after session completed) Oral Cavity - Dentition: Adequate natural dentition Self-Feeding Abilities: Needs assist Patient Positioning: Upright in chair Baseline Vocal Quality: Low  vocal intensity Volitional Cough: Weak Volitional Swallow: Able to elicit (after oral moisture)    Oral/Motor/Sensory Function Overall Oral Motor/Sensory Function: Other (comment) (pt leans left,  generalized weakness)   Ice Chips Ice chips: Within functional limits Presentation: Spoon   Thin Liquid Thin Liquid: Impaired Presentation: Straw;Self Fed;Spoon;Cup Oral Phase Functional Implications: Other (comment) (clinically appears with delayed swallow- suspect oral) Other Comments: straw tested in chin tuck posture    Nectar Thick Nectar Thick Liquid: Impaired Presentation: Cup;Self Fed;Spoon;Straw Pharyngeal Phase Impairments: Throat Clearing - Immediate Other Comments: Milkshake- delayed cough -- concern for retention around secretions   Honey Thick Honey Thick Liquid: Not tested   Puree Puree: Impaired Presentation: Self Fed;Spoon Oral Phase Impairments: Reduced lingual movement/coordination Oral Phase Functional Implications: Prolonged oral transit   Solid     Solid: Impaired Oral Phase Impairments: Reduced labial seal;Reduced lingual movement/coordination;Impaired mastication Oral Phase Functional Implications: Impaired mastication;Oral residue (use of icecream shake to moisten bolus to aid oral transiting)      Chales Abrahams 06/22/2020,6:42 PM  Rolena Infante, MS [facility] SLP Acute Rehab Services Office [phone]

## 2020-06-22 NOTE — Plan of Care (Signed)
  Problem: Clinical Measurements: Goal: Respiratory complications will improve Outcome: Progressing   Problem: Clinical Measurements: Goal: Cardiovascular complication will be avoided Outcome: Progressing   Problem: Coping: Goal: Level of anxiety will decrease Outcome: Progressing   Problem: Pain Managment: Goal: General experience of comfort will improve Outcome: Progressing   Problem: Safety: Goal: Ability to remain free from injury will improve Outcome: Progressing   Problem: Skin Integrity: Goal: Risk for impaired skin integrity will decrease Outcome: Progressing   

## 2020-06-22 NOTE — Consult Note (Signed)
Consultation Note Date: 06/22/2020   Patient Name: Randall [name]  DOB: 01-12-1941  MRN: [id number]  Age / Sex: 79 y.o., male   PCP: Tisovec, Fransico Him, MD Referring Physician: Marcell Anger*   REASON FOR CONSULTATION:Establishing goals of care  Palliative Care consult requested for goals of care discussion in this 79 y.o. male with multiple medical problems including Parkinson's disease, GERD, hyperlipidemia, arthritis, anxiety, diabetes, and urinary incontinence.  Patient presented to the ED from home with planes of increased weakness and decreased oral intake.  Per notation wife reported witnessing aspiration event while drinking fluids the morning prior to admission.  During ED work-up chest x-ray negative.  BUN 81, creatinine 4.76, WBC 13.7.  UA showed large leukocytes, many bacteria, and large blood.  Patient was initiated on IV Rocephin with IV bolus given.  Since admission patient continues to have worsening renal failure.  He has a pending SLP evaluation due to high risk of aspiration.  Clinical Assessment and Goals of Care: I have reviewed medical records including lab results, imaging, Epic notes, and MAR, received report from the bedside RN, and assessed the patient. I met at the bedside with patient and his wife, [place] to discuss diagnosis prognosis, [place], EOL wishes, disposition and options.  I introduced Palliative Medicine as specialized medical care for people living with serious illness. It focuses on providing relief from the symptoms and stress of a serious illness. The goal is to improve quality of life for both the patient and the family.   Randall [name] is awake, alert to self, wife, location.  He is unable to appropriately state date of birth.  When asked he says his birthday was yesterday.  Unable to express the year appropriately.  He does answer some questions appropriately however with some noticeable confusion.  We discussed a brief life review of the  patient, along with  his functional and nutritional status.  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  They have been married for more than 51 years.  They have 1 daughter who is a Teacher, English as a foreign language in [place] and 1 grandchild.  Patient enjoys reading the [place] Times and watching the news/sports on TV.  Wife reports patient originally diagnosed with Parkinson's in 2010.  He generally has a good appetite including snacks throughout the day and at bedtime.  However approximately 10 days prior to admission patient began showing signs of somewhat of decline.  Reports he was less responsive, grumpy, decreased appetite to the point he was not eating or drinking much, and she also noticed 2 of his 3 daily dose of Effexor pills in the sink.  He is generally incontinent of urine and stool.  Patient was previously ambulatory with a walker but required assistance with all ADLs.  We discussed His current illness and what it means in the larger context of His on-going co-morbidities. With specific discussions regarding his progression of Parkinson's, acute renal failure, risk of aspiration, and overall functional and nutritional state.  Natural disease trajectory and expectations at EOL were discussed.  Wife verbalized understanding of current illness, comorbidities, and disease trajectory.  We discussed signs and symptoms of disease progression with a focus on was most important to patient and family.  I attempted to elicit values and goals of care important to the patient.    The difference between aggressive medical intervention and comfort care was considered in light of the patient's goals of care. I educated wife on what  comfort care measures would look like.  Randall [name] verbalized understanding expressing wishes to continue to treat the treatable while hospitalized with goal of discharging home with hospice support.  Wife realistic in her understanding of patient's overall  condition and needs. She reports she has spoken with hospice over the past several days regarding initiation of services.  Support given.  Education provided regarding hospice services outpatient including their goals and philosophy of care.  Wife verbalizes understanding care will be focused on patient's comfort in the home with a goal of eliminating rehospitalization's and managing symptoms and needs in place.  Randall [name] verbalizes understanding expressing wishes to proceed with their support at discharge. She reports she has been in discussion and would like [facility] for services.   Advanced directives, concepts specific to code status, artifical feeding and hydration, and rehospitalization were considered and discussed. Patient does have a documented advanced directive.  Randall [name] confirmed DNR/DNI, no artificial feedings/PEG and hydration, no unnecessary rehospitalization, and no wishes to pursue dialysis in the setting of renal failure. We discussed patient's risk of aspiration and pending SLP evaluation.  Wife verbalized understanding however if patient is not going to be evaluated sometime today she would like to proceed with a soft diet to allow patient nourishment.  He has been requesting applesauce and she does not want this with help from him.  We discussed comfort feedings and wife would like to allow patient food as he desires while waiting for further recommendations from SLP team.  We discussed home equipment need. At this time she would like to see how patient would do in the home prior to bringing in a hospital bed. Patient may need a shower chair with arms and non-emergent ems transport home at discharge.   Questions and concerns were addressed.  Wife was encouraged to call with questions or concerns.  PMT will continue to support holistically.   SOCIAL HISTORY:     reports that he has never smoked. He has never used smokeless tobacco. He reports current alcohol use of about  20.0 standard drinks of alcohol per week. He reports that he does not use drugs.  CODE STATUS: DNR  ADVANCE DIRECTIVES: Randall [name] (wife/POA)   SYMPTOM MANAGEMENT: Per attending  Palliative Prophylaxis:   Aspiration, Bowel Regimen, Delirium Protocol, Frequent Pain Assessment, Oral Care and Turn Reposition  PSYCHO-SOCIAL/SPIRITUAL:  Support System: Family  Desire for further Chaplaincy support: No  Additional Recommendations (Limitations, Scope, Preferences):  Avoid Hospitalization, Initiate Comfort Feeding, No Artificial Feeding, No Hemodialysis and Continue to treat the treatable with a goal of discharging home with hospice support  Education on hospice   PAST MEDICAL HISTORY: Past Medical History:  Diagnosis Date  . Anxiety   . Arthritis   . Diabetes mellitus without complication ([place])   . GERD (gastroesophageal reflux disease)   . Hyperlipidemia   . Incontinence of urine    DR  RON   DAVIS    LOV FEB. 2015  . Neuromuscular disorder ([place])   . Parkinson disease ([place])    Just replaced battery 06/2019  . RSD (reflex sympathetic dystrophy)    2005    ALLERGIES:  is allergic to iodine and shellfish allergy.   MEDICATIONS:  Current Facility-Administered Medications  Medication Dose Route Frequency Provider Last Rate Last Admin  . 0.9 %  sodium chloride infusion   Intravenous Continuous Donne Hazel, MD 100 mL/hr at 06/22/20 0730 Rate Verify at 06/22/20 0730  . acetaminophen (TYLENOL) tablet 650 mg  650 mg Oral Q6H PRN Donne Hazel, MD       Or  . acetaminophen (TYLENOL) suppository 650 mg  650 mg Rectal Q6H PRN Donne Hazel, MD      . calcium gluconate 1 g/ 50 mL sodium chloride IVPB  1 g Intravenous Once Spongberg, Audie Pinto, MD      . Carbidopa-Levodopa ER (SINEMET CR) 25-100 MG tablet controlled release 1.5 tablet  1.5 tablet Oral TID Donne Hazel, MD   1.5 tablet at 06/22/20 0929  . cefTRIAXone (ROCEPHIN) 1 g in sodium chloride 0.9 % 100 mL  IVPB  1 g Intravenous Q24H Donne Hazel, MD      . donepezil (ARICEPT) tablet 5 mg  5 mg Oral QHS Donne Hazel, MD   5 mg at 06/21/20 2211  . heparin injection 5,000 Units  5,000 Units Subcutaneous Q8H Donne Hazel, MD   5,000 Units at 06/22/20 [phone]  . insulin aspart (novoLOG) injection 0-5 Units  0-5 Units Subcutaneous QHS Donne Hazel, MD      . insulin aspart (novoLOG) injection 0-9 Units  0-9 Units Subcutaneous TID WC Donne Hazel, MD      . lipase/protease/amylase (CREON) capsule 36,000 Units  36,000 Units Oral TID with meals Donne Hazel, MD      . lipase/protease/amylase (CREON) capsule 36,000 Units  36,000 Units Oral PRN Donne Hazel, MD      . rOPINIRole (REQUIP) tablet 2 mg  2 mg Oral TID Donne Hazel, MD   2 mg at 06/21/20 2213  . venlafaxine XR (EFFEXOR-XR) 24 hr capsule 225 mg  225 mg Oral Q breakfast Donne Hazel, MD   225 mg at 06/22/20 0931    VITAL SIGNS: BP (!) 141/65 (BP Location: Left Arm)   Pulse 87   Temp 98.3 F (36.8 C) (Oral)   Resp (!) 22   Ht 5' 11"  (1.803 m)   Wt 67.8 kg   SpO2 98%   BMI 20.85 kg/m  Filed Weights   06/21/20 1940  Weight: 67.8 kg    Estimated body mass index is 20.85 kg/m as calculated from the following:   Height as of this encounter: 5' 11"  (1.803 m).   Weight as of this encounter: 67.8 kg.  LABS: CBC:    Component Value Date/Time   WBC 14.4 (H) 06/22/2020 0407   HGB 7.9 (L) 06/22/2020 0407   HCT 24.0 (L) 06/22/2020 0407   PLT 164 06/22/2020 0407   Comprehensive Metabolic Panel:    Component Value Date/Time   NA 143 06/22/2020 0407   K 4.4 06/22/2020 0407   BUN 82 (H) 06/22/2020 0407   CREATININE 4.83 (H) 06/22/2020 0407   ALBUMIN 3.4 (L) 06/22/2020 0407     Review of Systems  Neurological: Positive for weakness.  Unless otherwise noted, a complete review of systems is negative.  Physical Exam General: NAD, chronically-ill appearing Cardiovascular: regular rate and rhythm Pulmonary:  clear ant fields, diminished bilaterally  Abdomen: soft, nontender, + bowel sounds Extremities: no edema, no joint deformities Skin: no rashes, warm and dry Neurological: Alert to self, location, wife.  Mood appropriate.  Will follow commands.   Prognosis: (Guarded-POOR)  Discharge Planning:  Home with Hospice  Recommendations: . DNR/DNI-as confirmed by wife, [place]. . Continue to treat the treatable while hospitalized per medical team with a goal of discharging home with outpatient hospice support. . No artificial feeding/PEG, no hemodialysis, no unnecessary rehospitalizations. . Detailed education  regarding disease trajectory, risk of aspiration, renal failure.  Wife is realistic in her understanding expressing goal of patient to be in the home with support versus a facility.  Patient somewhat able to be involved in discussion and is aware wife is making arrangements for home support.  He acknowledges he would not want to be placed in a facility. . Wife has initially been in contact with outpatient hospice services.  She reports she has been speaking to someone with the [facility] hospice location [facility]).  She would like their support once patient is discharged home.  (TOC referral placed).  Wife reports patient would possibly need EMS transport home and shower chair with handles if possible. Marland Kitchen PMT will continue to support and follow. Please call team line with urgent needs.   Palliative Performance Scale: PPS 20-30%              Wife ([place]) expressed understanding and was in agreement with this plan.   Thank you for allowing the Palliative Medicine Team to assist in the care of this patient.  Time In: 1330 Time Out: 1435 Time Total: 65 min.   Visit consisted of counseling and education dealing with the complex and emotionally intense issues of symptom management and palliative care in the setting of serious and potentially life-threatening illness.Greater than 50%  of  this time was spent counseling and coordinating care related to the above assessment and plan.  Signed by:  Alda Lea, AGPCNP-BC Palliative Medicine Team  Phone: [phone] Pager: [phone] Amion: Bjorn Pippin

## 2020-06-22 NOTE — Evaluation (Signed)
Occupational Therapy Evaluation Patient Details Name: Randall [name] MRN: [id number] DOB: [birth year] Today's Date: 06/22/2020    History of Present Illness Randall [name] is a 79 y.o. male with medical history significant of parkinson's disease, DM2, arthritis who presents to the ED with concerns of increased weakness and poor PO intake.   Clinical Impression   [name] presents with grossly functional ROM of upper extremities (though some limitations in wrist and fingers) and strength with manual muscle testing but overall presents with generalized weakness. Patient also exhibits decreased activity tolerance and impaired balance. Patient exhibits mild tremors and bradykinesia. Patient mod assist to transfer out of bed and mod assist for steadying and walker management to take steps to the recliner. Patient max assist for lower body ADLs and predominantly mod assist for upper body ADLs. Patient's activity limited to transfer to recliner and seated grooming task today. With standing and taking steps patient exhibits heavy breathing but o2 sat and HR WFL. Patient will benefit from skilled OT services to improve deficits and learn compensatory strategies as needed in order to improve functional abilities. Patient's wife would like patient to go home at discharge with [facility] services.    Follow Up Recommendations  Home health OT    Equipment Recommendations  Tub/shower seat    Recommendations for Other Services       Precautions / Restrictions Precautions Precautions: Fall Restrictions Weight Bearing Restrictions: No      Mobility Bed Mobility Overal bed mobility: Needs Assistance Bed Mobility: Supine to Sit     Supine to sit: Mod assist;HOB elevated        Transfers Overall transfer level: Needs assistance Equipment used: Rolling walker (2 wheeled) Transfers: Sit to/from [employer] Transfers Sit to Stand: From elevated surface;Min assist Stand pivot transfers:  Mod assist            Balance Overall balance assessment: Needs assistance Sitting-balance support: No upper extremity supported;Feet supported Sitting balance-Leahy Scale: Fair     Standing balance support: During functional activity;Bilateral upper extremity supported Standing balance-Leahy Scale: Poor Standing balance comment: Initial posterior lean when standing but improved with steps                           ADL either performed or assessed with clinical judgement   ADL Overall ADL's : Needs assistance/impaired Eating/Feeding: Set up;Sitting   Grooming: Set up;Sitting Grooming Details (indicate cue type and reason): patient washed face sitting in recliner Upper Body Bathing: Set up;Moderate assistance;Sitting   Lower Body Bathing: Maximal assistance;Set up;Sit to/from stand   Upper Body Dressing : Moderate assistance;Set up;Sitting   Lower Body Dressing: Maximal assistance;Sit to/from stand;Set up   Toilet Transfer: Stand-pivot;Moderate assistance;BSC;RW;Cueing for sequencing   Toileting- Clothing Manipulation and Hygiene: Maximal assistance;Sit to/from stand       Functional mobility during ADLs: Moderate assistance;Rolling walker General ADL Comments: assistance for steadying and walker management     Vision   Vision Assessment?: No apparent visual deficits     Perception     Praxis      Pertinent Vitals/Pain Pain Assessment: Faces Faces Pain Scale: Hurts a little bit Pain Location: Left Hip Pain Intervention(s): Monitored during session     Hand Dominance Right   Extremity/Trunk Assessment Upper Extremity Assessment Upper Extremity Assessment: Overall WFL for tasks assessed;RUE deficits/detail;LUE deficits/detail RUE Deficits / Details: Grossly functional shoulder ROM 4/5 strength, wrist extension to neutral, tends to keep wrist in  a flexed position, some loss of ROM in finger extension LUE Deficits / Details: Grossly functional  shoulder ROM, strength 4-5, decreased wrist extension, tends to keep wrist in a flexed position, some loss of ROM in finger extension   Lower Extremity Assessment Lower Extremity Assessment: Defer to PT evaluation       Communication Communication Communication: No difficulties   Cognition Arousal/Alertness: Awake/alert Behavior During Therapy: WFL for tasks assessed/performed Overall Cognitive Status: Within Functional Limits for tasks assessed                                     General Comments       Exercises     Shoulder Instructions      Home Living Family/patient expects to be discharged to:: Private residence Living Arrangements: Spouse/significant other Available Help at Discharge: Available 24 hours/day Type of Home: House Home Access: Stairs to enter Secretary/administrator of Steps: 4 Entrance Stairs-Rails: Right Home Layout: One level               Home Equipment: Walker - 2 wheels   Additional Comments: Has a step w/ rail to assist with getting into bed      Prior Functioning/Environment Level of Independence: Needs assistance  Gait / Transfers Assistance Needed: Ambulated with walker ADL's / Homemaking Assistance Needed: assistance with dressing, toileting and bathing - especially lower body or any activity that requires pulling up or bending over. Communication / Swallowing Assistance Needed: Mild dysarthria.          OT Problem List: Decreased strength;Decreased activity tolerance;Impaired balance (sitting and/or standing);Decreased coordination;Decreased safety awareness;Decreased knowledge of use of DME or AE;Pain      OT Treatment/Interventions: Self-care/ADL training;Therapeutic exercise;Neuromuscular education;DME and/or AE instruction;Patient/family education;Manual therapy;Therapeutic activities;Balance training    OT Goals(Current goals can be found in the care plan section) Acute Rehab OT Goals Patient Stated Goal:  improve mobility OT Goal Formulation: With patient/family Time For Goal Achievement: 07/06/20 Potential to Achieve Goals: Fair  OT Frequency: Min 2X/week   Barriers to D/C: Inaccessible home environment  4 steps to get inside house       Co-evaluation              AM-PAC OT "6 Clicks" Daily Activity     Outcome Measure Help from another person eating meals?: A Little Help from another person taking care of personal grooming?: A Little Help from another person toileting, which includes using toliet, bedpan, or urinal?: A Lot Help from another person bathing (including washing, rinsing, drying)?: A Lot Help from another person to put on and taking off regular upper body clothing?: A Lot Help from another person to put on and taking off regular lower body clothing?: A Lot 6 Click Score: 14   End of Session Equipment Utilized During Treatment: Gait belt;Rolling walker Nurse Communication: Mobility status  Activity Tolerance: Patient tolerated treatment well Patient left: in chair;with call bell/phone within reach;with family/visitor present;with chair alarm set  OT Visit Diagnosis: Muscle weakness (generalized) (M62.81);Other abnormalities of gait and mobility (R26.89);Unsteadiness on feet (R26.81);Pain Pain - Right/Left: Left Pain - part of body: Hip                Time: 6144-3154 OT Time Calculation (min): 29 min Charges:  OT General Charges $OT Visit: 1 Visit OT Evaluation $OT Eval Moderate Complexity: 1 Mod OT Treatments $Self Care/Home Management : 8-22 mins  Waldron Session, OTR/L Acute Care Rehab Services  Office [phone] Pager: [phone]   Kelli Churn 06/22/2020, 11:06 AM

## 2020-06-22 NOTE — Progress Notes (Signed)
PROGRESS NOTE    Randall [name]  ZOX:[id number] DOB: 08/10/1941 DOA: 06/21/2020 PCP: Gaspar Garbe, MD   Brief Narrative:  Per admitting MD HPI: Randall [name] is a 79 y.o. male with medical history significant of parkinson's disease, DM2, arthritis who presents to the ED with concerns of increased weakness and poor PO intake. Pt is somewhat confused at this time. Majority of history was obtained from pt's wife who is at bedside. Per family , pt had been in his usual state of health until 5 days prior to admit, when he acutely lost his appetite and had only minimal amounts of PO intake. Decreased appetite continued and patient soon lost the majority of his strength, ultimately being unable to stand even with assistance one day prior to admit. Pt has been complaining of nausea during this time and pt's wife reports witnessing aspiration event while pt was drinking fluids on the morning of admission. Pt denies coughing or chest pains.   Of note, patient's wife is currently in the process of establishing pt with home hospice services through "friends."   ED Course: In the ED, pt noted to have BUN/Cr of 81 and 4.76, respectively. WBC noted to be 13.7. Bladder scan revealed around 180cc of urine. UA was notable for large leukocytes, many bacteria, and large blood. CXR was obtained, reviewed, and was found to be clear. Pt was started empirically on rocephin and 1LNS bolus was given. Hospitalist consulted for consideration for admission.   Assessment & Plan:   Principal Problem:   ARF (acute renal failure) (HCC) Active Problems:   Diabetes (HCC)   Parkinson disease (HCC)   S/P total knee arthroplasty   Hyperlipidemia   Benign fibroma of prostate   Acute lower UTI   DNR (do not resuscitate)   1. ARF 1. Presenting BUN/Cr elevated at 81 and 4.76, respectively, baseline Cr <1 2. Clinically dehydrated with decreased turgor, dry membranes with history of very limited PO intake 3. Bladder  scan in ED with 182cc of urine 4. Will continue IVF as tolerated 5. Recheck bmet in AM, if not imrpoving then renal consult depending on hospice status/palliative care consult 2. UTI 1. Pt with UA showing >50 wbc, many bacteria, large blood, large leuks with elevated WBC of 13.7 2. One dose of rocephin given in ED, will continue 3. Follow urine culture, re-order blood cx 4. Repeat cbc in AM 3. Possible aspiration pneumonitis 1. Pt had witnessed aspiration event on AM of admit while trying to drink fluids 2. CXR reviewed, clear 3. On rocephin for UTI above 4. Will keep NPO and consult SLP-entered 7/18 4. DM2 1. Monitor glucose 2. On insulin prior to admit 3. Given NPO status and very limited PO intake in setting of ARF, will continue only on sensitive scale SSI coverage as needed 4. Will check a1c 5. Parkinson's 1. Seems to be stable at this time 2. Cont home meds as tolerated 6. OA 1. Pt is s/p prior knee surgery 2. Will consult PT/OT 7. HLD 1. On statin prior to admit, will continue 8. Hx fibroma of prostate 1. Follows Urology at [name], last seen on 7/12 2. Would cont home regimen 9. Hypocalcemia 1. Presenting calcium of 6.1, repeat 6.2 with alb 3.4 2. Suspect secondary to marked malnutrition 3. Calcium gluconate IV given in ED, repeat 4. Repeat lytes in AM 10. End of Life 1. Family understands patient has progressive disease and has been working on trying to establish with home  hospice through "friends" prior to admit 2. Given acute decompensation, will consult Palliative Care 3. DNR status confirmed with family  DVT prophylaxis: Heparin SQ  Code Status: dnr    Code Status Orders  (From admission, onward)         Start     Ordered   06/21/20 1722  Do not attempt resuscitation (DNR)  Continuous       Question Answer Comment  In the event of cardiac or respiratory ARREST Do not call a "code blue"   In the event of cardiac or respiratory ARREST Do not  perform Intubation, CPR, defibrillation or ACLS   In the event of cardiac or respiratory ARREST Use medication by any route, position, wound care, and other measures to relive pain and suffering. May use oxygen, suction and manual treatment of airway obstruction as needed for comfort.      06/21/20 1724        Code Status History    Date Active Date Inactive Code Status Order ID Comments User Context   03/09/2014 2319 03/11/2014 1405 Full Code [id number]  Minda Meo, MD Inpatient   03/05/2014 1049 03/09/2014 2319 Full Code [id number]  Margit Hanks, MD Outpatient   02/19/2014 1139 02/22/2014 1602 Full Code [id number]  Drema Halon, PA-C Inpatient   Advance Care Planning Activity    Advance Directive Documentation     Most Recent Value  Type of Advance Directive Healthcare Power of Attorney  Pre-existing out of facility DNR order (yellow form or pink MOST form) --  "MOST" Form in Place? --     Family Communication: Discussed with the patient in detail, no one at bedside today Disposition Plan:   Status is: Inpatient  Remains inpatient appropriate because:IV treatments appropriate due to intensity of illness or inability to take PO and Inpatient level of care appropriate due to severity of illness   Dispo: The patient is from: Home              Anticipated d/c is to: hospice home vs house              Anticipated d/c date is: 2 days              Patient currently is not medically stable to d/c.      Consults called: palliative Admission status: Inpatient   Consultants:   as above  Procedures:  DG Chest 2 View  Result Date: 06/21/2020 CLINICAL DATA:  Worsening weakness and difficulty swallowing. Cough. EXAM: CHEST - 2 VIEW COMPARISON:  May 10, 2005 FINDINGS: The heart size and mediastinal contours are within normal limits. Both lungs are clear. The visualized skeletal structures are unremarkable. IMPRESSION: No active cardiopulmonary disease. Electronically Signed    By: Ted Mcalpine M.D.   On: 06/21/2020 14:16   CT HEAD WO CONTRAST  Result Date: 06/21/2020 CLINICAL DATA:  Altered mental status. EXAM: CT HEAD WITHOUT CONTRAST TECHNIQUE: Contiguous axial images were obtained from the base of the skull through the vertex without intravenous contrast. COMPARISON:  04/01/2015. FINDINGS: Brain: Moderate to marked dilatation of the ventricles and moderate dilatation of the cortical sulci with progression. Mild to moderate patchy white matter low density in both cerebral hemispheres with progression. Interval right frontal electrode with its tip at the inferior aspect of the thalamus on the right. No intracranial hemorrhage, mass lesion or CT evidence of acute infarction. Vascular: No hyperdense vessel or unexpected calcification. Skull: Normal. Negative for fracture or focal lesion. Sinuses/Orbits:  Status post bilateral cataract extraction. Unremarkable bones and included paranasal sinuses. Other: None. IMPRESSION: 1. No acute abnormality. 2. Moderate to marked diffuse cerebral and cerebellar atrophy with progression. 3. Mild to moderate chronic small vessel white matter ischemic changes in both cerebral hemispheres with progression. 4. Interval right frontal electrode with its tip at the inferior aspect of the thalamus on the right. Electronically Signed   By: Beckie Salts M.D.   On: 06/21/2020 14:31   US RENAL  Result Date: 06/21/2020 CLINICAL DATA:  Acute renal failure. EXAM: RENAL / URINARY TRACT ULTRASOUND COMPLETE COMPARISON:  None. FINDINGS: Right Kidney: Renal measurements: 10.1 x 5.3 x 5.7 cm = volume: 159 mL. Increased echogenicity. Mild right hydronephrosis. Left Kidney: Renal measurements: 12.4 x 6.8 x 5.8 cm = volume: 255 mL. Increased echogenicity. Moderate hydronephrosis. Bladder: Thick-walled with mobile debris.  Bilateral ureteral jets seen. Other: None. IMPRESSION: 1.  Increased echogenicity of the bilateral kidneys. 2.  Mild right and moderate left  hydronephrosis. 3.  Thick-walled urinary bladder contains mobile debris. 4.  Bilateral ureteral jets are present. Electronically Signed   By: Ted Mcalpine M.D.   On: 06/21/2020 18:29     Antimicrobials:   rocephin   Subjective: No acute events ovenright  Objective: Vitals:   06/22/20 0239 06/22/20 0604 06/22/20 0956 06/22/20 1227  BP: 137/60 (!) 147/66 (!) 141/65   Pulse: 86 89 87   Resp: 18 17 (!) 22   Temp: 98.1 F (36.7 C) 98.3 F (36.8 C)    TempSrc: Oral Oral    SpO2: 98% 98% 98% 98%  Weight:      Height:        Intake/Output Summary (Last 24 hours) at 06/22/2020 1250 Last data filed at 06/22/2020 1043 Gross per 24 hour  Intake 1092.03 ml  Output 1175 ml  Net -82.97 ml   Filed Weights   06/21/20 1940  Weight: 67.8 kg    Examination:  General exam: Appears calm and comfortable  Respiratory system: Clear to auscultation. Respiratory effort normal. Cardiovascular system: S1 & S2 heard, RRR. No JVD, murmurs, rubs, gallops or clicks. No pedal edema. Gastrointestinal system: Abdomen is nondistended, soft and nontender. No organomegaly or masses felt. Normal bowel sounds heard. Central nervous system: Alert and oriented. No focal neurological deficits. Extremities: globally weak, wwp Skin: No rashes, lesions or ulcers Psychiatry: Judgement and insight appear normal. Mood & affect flat    Data Reviewed: I have personally reviewed following labs and imaging studies  CBC: Recent Labs  Lab 06/21/20 1312 06/22/20 0407  WBC 13.7* 14.4*  NEUTROABS 11.9*  --   HGB 8.2* 7.9*  HCT 24.6* 24.0*  MCV 101.7* 102.6*  PLT 167 164   Basic Metabolic Panel: Recent Labs  Lab 06/21/20 1312 06/22/20 0407  NA 142 143  K 4.3 4.4  CL 117* 121*  CO2 10* 8*  GLUCOSE 102* 83  BUN 81* 82*  CREATININE 4.76* 4.83*  CALCIUM 6.1* 6.2*   GFR: Estimated Creatinine Clearance: 11.9 mL/min (A) (by C-G formula based on SCr of 4.83 mg/dL (H)). Liver Function  Tests: Recent Labs  Lab 06/21/20 1312 06/22/20 0407  AST 14* 11*  ALT 8 7  ALKPHOS 179* 160*  BILITOT 0.2* 0.4  PROT 6.6 6.1*  ALBUMIN 3.6 3.4*   No results for input(s): LIPASE, AMYLASE in the last 168 hours. No results for input(s): AMMONIA in the last 168 hours. Coagulation Profile: No results for input(s): INR, PROTIME in the last 168 hours. Cardiac  Enzymes: No results for input(s): CKTOTAL, CKMB, CKMBINDEX, TROPONINI in the last 168 hours. BNP (last 3 results) No results for input(s): PROBNP in the last 8760 hours. HbA1C: No results for input(s): HGBA1C in the last 72 hours. CBG: Recent Labs  Lab 06/21/20 2129 06/22/20 0734 06/22/20 1202  GLUCAP 71 123* 172*   Lipid Profile: No results for input(s): CHOL, HDL, LDLCALC, TRIG, CHOLHDL, LDLDIRECT in the last 72 hours. Thyroid Function Tests: No results for input(s): TSH, T4TOTAL, FREET4, T3FREE, THYROIDAB in the last 72 hours. Anemia Panel: No results for input(s): VITAMINB12, FOLATE, FERRITIN, TIBC, IRON, RETICCTPCT in the last 72 hours. Sepsis Labs: No results for input(s): PROCALCITON, LATICACIDVEN in the last 168 hours.  Recent Results (from the past 240 hour(s))  SARS Coronavirus 2 by RT PCR (hospital order, performed in [facility] hospital lab) Nasopharyngeal Nasopharyngeal Swab     Status: None   Collection Time: 06/21/20  3:20 PM   Specimen: Nasopharyngeal Swab  Result Value Ref Range Status   SARS Coronavirus 2 NEGATIVE NEGATIVE Final    Comment: (NOTE) SARS-CoV-2 target nucleic acids are NOT DETECTED.  The SARS-CoV-2 RNA is generally detectable in upper and lower respiratory specimens during the acute phase of infection. The lowest concentration of SARS-CoV-2 viral copies this assay can detect is 250 copies / mL. A negative result does not preclude SARS-CoV-2 infection and should not be used as the sole basis for treatment or other patient management decisions.  A negative result may occur  with improper specimen collection / handling, submission of specimen other than nasopharyngeal swab, presence of viral mutation(s) within the areas targeted by this assay, and inadequate number of viral copies (<250 copies / mL). A negative result must be combined with clinical observations, patient history, and epidemiological information.  Fact Sheet for Patients:   BoilerBrush.com.cyhttps://www.fda.gov/media/136312/download  Fact Sheet for Healthcare Providers: https://pope.com/https://www.fda.gov/media/136313/download  This test is not yet approved or  cleared by the [place] FDA and has been authorized for detection and/or diagnosis of SARS-CoV-2 by FDA under an Emergency Use Authorization (EUA).  This EUA will remain in effect (meaning this test can be used) for the duration of the COVID-19 declaration under Section 564(b)(1) of the Act, 21 U.S.C. section 360bbb-3(b)(1), unless the authorization is terminated or revoked sooner.  Performed at [facility], [address] [place], [place] [address]          Radiology Studies: DG Chest 2 View  Result Date: 06/21/2020 CLINICAL DATA:  Worsening weakness and difficulty swallowing. Cough. EXAM: CHEST - 2 VIEW COMPARISON:  May 10, 2005 FINDINGS: The heart size and mediastinal contours are within normal limits. Both lungs are clear. The visualized skeletal structures are unremarkable. IMPRESSION: No active cardiopulmonary disease. Electronically Signed   By: Ted Mcalpineobrinka  Dimitrova M.D.   On: 06/21/2020 14:16   CT HEAD WO CONTRAST  Result Date: 06/21/2020 CLINICAL DATA:  Altered mental status. EXAM: CT HEAD WITHOUT CONTRAST TECHNIQUE: Contiguous axial images were obtained from the base of the skull through the vertex without intravenous contrast. COMPARISON:  04/01/2015. FINDINGS: Brain: Moderate to marked dilatation of the ventricles and moderate dilatation of the cortical sulci with progression. Mild to moderate patchy white matter low density  in both cerebral hemispheres with progression. Interval right frontal electrode with its tip at the inferior aspect of the thalamus on the right. No intracranial hemorrhage, mass lesion or CT evidence of acute infarction. Vascular: No hyperdense vessel or unexpected calcification. Skull: Normal. Negative for fracture or  focal lesion. Sinuses/Orbits: Status post bilateral cataract extraction. Unremarkable bones and included paranasal sinuses. Other: None. IMPRESSION: 1. No acute abnormality. 2. Moderate to marked diffuse cerebral and cerebellar atrophy with progression. 3. Mild to moderate chronic small vessel white matter ischemic changes in both cerebral hemispheres with progression. 4. Interval right frontal electrode with its tip at the inferior aspect of the thalamus on the right. Electronically Signed   By: Beckie Salts M.D.   On: 06/21/2020 14:31   US RENAL  Result Date: 06/21/2020 CLINICAL DATA:  Acute renal failure. EXAM: RENAL / URINARY TRACT ULTRASOUND COMPLETE COMPARISON:  None. FINDINGS: Right Kidney: Renal measurements: 10.1 x 5.3 x 5.7 cm = volume: 159 mL. Increased echogenicity. Mild right hydronephrosis. Left Kidney: Renal measurements: 12.4 x 6.8 x 5.8 cm = volume: 255 mL. Increased echogenicity. Moderate hydronephrosis. Bladder: Thick-walled with mobile debris.  Bilateral ureteral jets seen. Other: None. IMPRESSION: 1.  Increased echogenicity of the bilateral kidneys. 2.  Mild right and moderate left hydronephrosis. 3.  Thick-walled urinary bladder contains mobile debris. 4.  Bilateral ureteral jets are present. Electronically Signed   By: Ted Mcalpine M.D.   On: 06/21/2020 18:29        Scheduled Meds: . Carbidopa-Levodopa ER  1.5 tablet Oral TID  . donepezil  5 mg Oral QHS  . heparin  5,000 Units Subcutaneous Q8H  . insulin aspart  0-5 Units Subcutaneous QHS  . insulin aspart  0-9 Units Subcutaneous TID WC  . lipase/protease/amylase  36,000 Units Oral TID with meals  .  rOPINIRole  2 mg Oral TID  . venlafaxine XR  225 mg Oral Q breakfast   Continuous Infusions: . sodium chloride 100 mL/hr at 06/22/20 0730  . cefTRIAXone (ROCEPHIN)  IV       LOS: 1 day    Time spent: 35 min    Burke Keels, MD [facility]  If 7PM-7AM, please contact night-coverage  06/22/2020, 12:50 PM

## 2020-06-22 NOTE — Evaluation (Signed)
Physical Therapy Evaluation Patient Details Name: Randall [name] MRN: [id number] DOB: 1941-08-04 Today's Date: 06/22/2020   History of Present Illness  79 yo admitted with acute renal failure due to poor PO intake x 5 days with AMS and weakness. PMhx: Parkinson's, DM, arthritis  Clinical Impression  Pt pleasant with flat affect sitting in recliner on arrival and end of session. Pt disoriented and able to follow simple commands 80% of session. Pt with decreased strength, transfers, function and mobility who will benefit from acute therapy to maximize function and decrease burden of care.      Follow Up Recommendations Home health PT;Supervision for mobility/OOB    Equipment Recommendations  None recommended by PT    Recommendations for Other Services       Precautions / Restrictions Precautions Precautions: Fall Restrictions Weight Bearing Restrictions: No      Mobility  Bed Mobility Overal bed mobility: Needs Assistance Bed Mobility: Supine to Sit     Supine to sit: Mod assist;HOB elevated     General bed mobility comments: in chair on arrival and end of session  Transfers Overall transfer level: Needs assistance Equipment used: Rolling walker (2 wheeled) Transfers: Sit to/from Stand Sit to Stand: From elevated surface;Min assist Stand pivot transfers: Mod assist       General transfer comment: pt able to stand from recliner with bil UE on RW despite cues with RW stabilized x 2 trials with decreased assist on 2nd trial. With sitting on 2nd trial pt required max cues and physical assist to initiate hip flexion  Ambulation/Gait Ambulation/Gait assistance: Min assist Gait Distance (Feet): 120 Feet Assistive device: Rolling walker (2 wheeled) Gait Pattern/deviations: Shuffle;Trunk flexed   Gait velocity interpretation: 1.31 - 2.62 ft/sec, indicative of limited community ambulator General Gait Details: cues for direction and proximity to RW with assist at times to  reposition and steer RW. Pt able to recognize fatigue and need to limit distance with increased WOB with sats 98% on RA  Stairs            Wheelchair Mobility    Modified Rankin (Stroke Patients Only)       Balance Overall balance assessment: Needs assistance Sitting-balance support: No upper extremity supported;Feet supported Sitting balance-Leahy Scale: Fair     Standing balance support: During functional activity;Bilateral upper extremity supported Standing balance-Leahy Scale: Poor Standing balance comment: reliant on RW for support with gait, posterior lean with sitting                             Pertinent Vitals/Pain Pain Assessment: No/denies pain Faces Pain Scale: Hurts a little bit Pain Location: Left Hip Pain Intervention(s): Monitored during session    Home Living Family/patient expects to be discharged to:: Private residence Living Arrangements: Spouse/significant other Available Help at Discharge: Available 24 hours/day Type of Home: House Home Access: Stairs to enter Entrance Stairs-Rails: Right Entrance Stairs-Number of Steps: 4 Home Layout: One level Home Equipment: Walker - 2 wheels Additional Comments: Has a step w/ rail to assist with getting into bed    Prior Function Level of Independence: Needs assistance   Gait / Transfers Assistance Needed: Ambulated with walker  ADL's / Homemaking Assistance Needed: assistance with dressing, toileting and bathing - especially lower body or any activity that requires pulling up or bending over.        Hand Dominance   Dominant Hand: Right    Extremity/Trunk Assessment   Upper  Extremity Assessment Upper Extremity Assessment: Generalized weakness RUE Deficits / Details: Grossly functional shoulder ROM 4/5 strength, wrist extension to neutral, tends to keep wrist in a flexed position, some loss of ROM in finger extension LUE Deficits / Details: Grossly functional shoulder ROM, strength  4-5, decreased wrist extension, tends to keep wrist in a flexed position, some loss of ROM in finger extension    Lower Extremity Assessment Lower Extremity Assessment: Generalized weakness    Cervical / Trunk Assessment Cervical / Trunk Assessment: Kyphotic  Communication   Communication: No difficulties  Cognition Arousal/Alertness: Awake/alert Behavior During Therapy: WFL for tasks assessed/performed Overall Cognitive Status: Impaired/Different from baseline Area of Impairment: Orientation;Memory;Safety/judgement                 Orientation Level: Disoriented to;Place;Time;Situation   Memory: Decreased short-term memory   Safety/Judgement: Decreased awareness of deficits;Decreased awareness of safety     General Comments: Pt stating "[place]" even after oriented to [facility] and stating he only lives a few blocks away      General Comments      Exercises     Assessment/Plan    PT Assessment Patient needs continued PT services  PT Problem List Decreased strength;Decreased mobility;Decreased safety awareness;Decreased activity tolerance;Decreased cognition;Decreased balance;Decreased knowledge of use of DME       PT Treatment Interventions DME instruction;Gait training;Balance training;Stair training;Functional mobility training;Therapeutic activities;Patient/family education;Cognitive remediation;Neuromuscular re-education    PT Goals (Current goals can be found in the Care Plan section)  Acute Rehab PT Goals Patient Stated Goal: return to walking PT Goal Formulation: With patient Time For Goal Achievement: 07/06/20 Potential to Achieve Goals: Fair    Frequency Min 3X/week   Barriers to discharge        Co-evaluation               AM-PAC PT "6 Clicks" Mobility  Outcome Measure Help needed turning from your back to your side while in a flat bed without using bedrails?: A Little Help needed moving from lying on your back to sitting on the side of  a flat bed without using bedrails?: A Little Help needed moving to and from a bed to a chair (including a wheelchair)?: A Little Help needed standing up from a chair using your arms (e.g., wheelchair or bedside chair)?: A Little Help needed to walk in hospital room?: A Little Help needed climbing 3-5 steps with a railing? : A Lot 6 Click Score: 17    End of Session Equipment Utilized During Treatment: Gait belt Activity Tolerance: Patient tolerated treatment well Patient left: in chair;with call bell/phone within reach;with nursing/sitter in room;with chair alarm set Nurse Communication: Mobility status PT Visit Diagnosis: Other abnormalities of gait and mobility (R26.89);Difficulty in walking, not elsewhere classified (R26.2)    Time: 1205-1220 PT Time Calculation (min) (ACUTE ONLY): 15 min   Charges:   PT Evaluation $PT Eval Moderate Complexity: 1 Mod          Theophil Thivierge P, PT Acute Rehabilitation Services Pager: [phone] Office: [phone]   Enedina Finner Santos Hardwick 06/22/2020, 12:36 PM

## 2020-06-22 NOTE — Progress Notes (Addendum)
  Speech Language Pathology Treatment: Dysphagia  Patient Details Name: CAMRYN QUESINBERRY MRN: [id number] DOB: 01-26-1941 Today's Date: 06/22/2020 Time: 8208-1388 SLP Time Calculation (min) (ACUTE ONLY): 25 min  Assessment / Plan / Recommendation Clinical Impression  Session focused on education regarding dysphagia, Parkinson's, alternative diets/compensations.    Chin tuck posture tested with thin water - no s/s of aspiration with chin tuck but also not present with neutral.   Advised this posture may be helpful as it closes off the airway and can accommodate a delay in the swallow.     Reviewed with pt/wife importance of oral care to decrease asp pna risk.  SLP against using mouthrinse with alcohol due to alcohol contributing to xerostomia.  Rinsing and expectorating after meals advised - or using toothettes to clear oral residuals to decrease aspiration of solid food retention.    Pt reports he does not prefer straw use but pt's wife advised it is difficult at times for pt to extend head to consume from a cup.  Reviewed option of "nosey cups" to allow use of cup without head extension required.    SLP also advised to consider consuming thicker liquids with meals if decreases cough with intake as increased viscocity can be helpful to accommodate oral control issues.  Provided pt and wife with a starter kit of xantham gum based thickener.  Pt agreeable to take medications with puree while in hospital.    Nutritionally dense foods *eg Ensures, protein shakes* may be helpful for energy conservation for pt to consume as needed if weak and mastication is laborious.    All education completed to maximize comfort with intake and mitigate frequency and quantity of aspiration.  No SLP follow up as all education completed.  Thanks for allow me to help care for this most pleasant pt and his wife.    HPI HPI: 79 yo male adm to [facility] with AMS, progressive weakness over the last few days.  Since early July,  pt's wife reports a decline in his function. Pt aspirated liquids on day of admission.  CXR  negative for acute change.  Swallow evaluation ordered.      SLP Plan  All goals met       Recommendations  Diet recommendations: Regular;Thin liquid (soft foods) Liquids provided via: Cup (only straw with Milkshakes, Ensure, etc or thin with chin tuck) Medication Administration: Whole meds with puree Compensations: Slow rate;Small sips/bites (start intake with liquids) Postural Changes and/or Swallow Maneuvers: Seated upright 90 degrees;Upright 30-60 min after meal                Oral Care Recommendations: Oral care QID Follow up Recommendations: None SLP Visit Diagnosis: Dysphagia, oral phase (R13.11) Plan: All goals met       GO                Macario Golds 06/22/2020, 7:02 PM  Kathleen Lime, MS [place] Office [phone]

## 2020-06-22 NOTE — Progress Notes (Signed)
Dr. Lurene Shadow notified pt swallowing concerns with straw this am. Pt did well overall with oral medications but did cough with use of straw. This improved however when taking sips from cup with no straw. Speech therapy ordered.

## 2020-06-22 NOTE — Progress Notes (Signed)
PT Cancellation Note  Patient Details Name: Randall [name] MRN: [id number] DOB: 1941-06-13   Cancelled Treatment:    Reason Eval/Treat Not Completed: Patient at procedure or test/unavailable (pt currently working with OT)   Enedina Finner Karine Garn 06/22/2020, 9:10 AM Merryl Hacker, PT Acute Rehabilitation Services Pager: [phone] Office: [phone]

## 2020-06-23 ENCOUNTER — Inpatient Hospital Stay (HOSPITAL_COMMUNITY): Payer: Medicare Other

## 2020-06-23 LAB — CBC WITH DIFFERENTIAL/PLATELET
Abs Immature Granulocytes: 0.15 10*3/uL — ABNORMAL HIGH (ref 0.00–0.07)
Basophils Absolute: 0 10*3/uL (ref 0.0–0.1)
Basophils Relative: 0 %
Eosinophils Absolute: 0 10*3/uL (ref 0.0–0.5)
Eosinophils Relative: 0 %
HCT: 22.5 % — ABNORMAL LOW (ref 39.0–52.0)
Hemoglobin: 7.2 g/dL — ABNORMAL LOW (ref 13.0–17.0)
Immature Granulocytes: 1 %
Lymphocytes Relative: 4 %
Lymphs Abs: 0.6 10*3/uL — ABNORMAL LOW (ref 0.7–4.0)
MCH: 33.8 pg (ref 26.0–34.0)
MCHC: 32 g/dL (ref 30.0–36.0)
MCV: 105.6 fL — ABNORMAL HIGH (ref 80.0–100.0)
Monocytes Absolute: 1.1 10*3/uL — ABNORMAL HIGH (ref 0.1–1.0)
Monocytes Relative: 8 %
Neutro Abs: 12.8 10*3/uL — ABNORMAL HIGH (ref 1.7–7.7)
Neutrophils Relative %: 87 %
Platelets: 158 10*3/uL (ref 150–400)
RBC: 2.13 MIL/uL — ABNORMAL LOW (ref 4.22–5.81)
RDW: 14.5 % (ref 11.5–15.5)
WBC: 14.7 10*3/uL — ABNORMAL HIGH (ref 4.0–10.5)
nRBC: 0 % (ref 0.0–0.2)

## 2020-06-23 LAB — GLUCOSE, CAPILLARY
Glucose-Capillary: 162 mg/dL — ABNORMAL HIGH (ref 70–99)
Glucose-Capillary: 180 mg/dL — ABNORMAL HIGH (ref 70–99)
Glucose-Capillary: 208 mg/dL — ABNORMAL HIGH (ref 70–99)
Glucose-Capillary: 176 mg/dL — ABNORMAL HIGH (ref 70–99)

## 2020-06-23 LAB — HEMOGLOBIN A1C
Hgb A1c MFr Bld: 6 % — ABNORMAL HIGH (ref 4.8–5.6)
Mean Plasma Glucose: 125.5 mg/dL

## 2020-06-23 LAB — BASIC METABOLIC PANEL
Anion gap: 16 — ABNORMAL HIGH (ref 5–15)
BUN: 84 mg/dL — ABNORMAL HIGH (ref 8–23)
CO2: 7 mmol/L — ABNORMAL LOW (ref 22–32)
Calcium: 6.6 mg/dL — ABNORMAL LOW (ref 8.9–10.3)
Chloride: 121 mmol/L — ABNORMAL HIGH (ref 98–111)
Creatinine, Ser: 5.05 mg/dL — ABNORMAL HIGH (ref 0.61–1.24)
GFR calc Af Amer: 12 mL/min — ABNORMAL LOW (ref >60)
GFR calc non Af Amer: 10 mL/min — ABNORMAL LOW (ref >60)
Glucose, Bld: 192 mg/dL — ABNORMAL HIGH (ref 70–99)
Potassium: 4.4 mmol/L (ref 3.5–5.1)
Sodium: 144 mmol/L (ref 135–145)

## 2020-06-23 LAB — IRON AND TIBC
Iron: 22 ug/dL — ABNORMAL LOW (ref 45–182)
Saturation Ratios: 15 % — ABNORMAL LOW (ref 17.9–39.5)
TIBC: 151 ug/dL — ABNORMAL LOW (ref 250–450)
UIBC: 129 ug/dL

## 2020-06-23 LAB — PREPARE RBC (CROSSMATCH)

## 2020-06-23 LAB — PHOSPHORUS: Phosphorus: 4.6 mg/dL (ref 2.5–4.6)

## 2020-06-23 LAB — FERRITIN: Ferritin: 232 ng/mL (ref 24–336)

## 2020-06-23 LAB — VITAMIN B12: Vitamin B-12: 161 pg/mL — ABNORMAL LOW (ref 180–914)

## 2020-06-23 LAB — FOLATE: Folate: 8 ng/mL (ref >5.9)

## 2020-06-23 MED ORDER — VITAMIN B-12 1000 MCG PO TABS
1000.0000 ug | ORAL_TABLET | Freq: Every day | ORAL | Status: DC
  Administered 2020-06-23 – 2020-06-29 (×6): 1000 ug via ORAL
  Filled 2020-06-23 (×6): qty 1

## 2020-06-23 MED ORDER — ENSURE ENLIVE PO LIQD: 237.0000 mL | Freq: Two times a day (BID) | ORAL | Status: DC | PRN

## 2020-06-23 MED ORDER — CHLORHEXIDINE GLUCONATE CLOTH 2 % EX PADS
6.0000 | MEDICATED_PAD | Freq: Every day | CUTANEOUS | Status: AC
  Administered 2020-06-23 – 2020-06-26 (×4): 6 via TOPICAL

## 2020-06-23 MED ORDER — SODIUM CHLORIDE 0.9% IV SOLUTION: Freq: Once | INTRAVENOUS | Status: DC

## 2020-06-23 MED ORDER — FERROUS SULFATE 325 (65 FE) MG PO TABS
325.0000 mg | ORAL_TABLET | Freq: Every day | ORAL | Status: DC
  Administered 2020-06-24 – 2020-06-29 (×6): 325 mg via ORAL
  Filled 2020-06-23 (×6): qty 1

## 2020-06-23 MED ORDER — CALCIUM CARBONATE ANTACID 500 MG PO CHEW
1.0000 | CHEWABLE_TABLET | Freq: Three times a day (TID) | ORAL | Status: DC
  Administered 2020-06-23: 200 mg via ORAL
  Filled 2020-06-23 (×2): qty 1

## 2020-06-23 MED ORDER — STERILE WATER FOR INJECTION IV SOLN
INTRAVENOUS | Status: DC
  Filled 2020-06-23: qty 150
  Filled 2020-06-23: qty 850
  Filled 2020-06-23 (×2): qty 150

## 2020-06-23 MED ORDER — ALBUTEROL SULFATE (2.5 MG/3ML) 0.083% IN NEBU
INHALATION_SOLUTION | RESPIRATORY_TRACT | Status: AC
  Administered 2020-06-23: 2.5 mg via RESPIRATORY_TRACT
  Filled 2020-06-23: qty 3

## 2020-06-23 MED ORDER — ALBUTEROL SULFATE (2.5 MG/3ML) 0.083% IN NEBU: 2.5000 mg | INHALATION_SOLUTION | Freq: Four times a day (QID) | RESPIRATORY_TRACT | Status: DC | PRN

## 2020-06-23 NOTE — Progress Notes (Signed)
RT called to bedside due to pt aspirating some water while taking pills.  Pt very congested with mild wheeze.  Pt given Neb and NT suctioned for a large amt of thin, clear secretions.  Pt tolerated well, O2 decreased to 4 LPM.  RT to monitor and assess as needed.

## 2020-06-23 NOTE — Consult Note (Addendum)
I have been asked to see the patient by Dr. Skeet Simmer, for evaluation and management of bilateral hydronephrosis.  History of present illness: 79 year old man with a history of Parkinson's disease presented to the ER with increased weakness and poor p.o. intake.  Patient's wife provided history today.  Urology was consulted after renal ultrasound showed mild right and moderate left hydronephrosis with thick-walled urinary bladder and debris.  Patient receives urologic care at [facility].  Per wife patient has been struggling with urinary incontinence and has recently tried a condom catheter.  She is unaware of any BPH diagnosis.   Creatinine 4.76 on admission and now 5.05.  Urinalysis with greater than 50 WBCs and many bacteria.   Review of systems: A 12 point comprehensive review of systems was obtained and is negative unless otherwise stated in the history of present illness.  Patient Active Problem List   Diagnosis Date Noted  . ARF (acute renal failure) (HCC) 06/21/2020  . Acute lower UTI 06/21/2020  . DNR (do not resuscitate) 06/21/2020  . Wound infection after surgery 03/09/2014  . 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 Hyperlipidemia   . Incontinence of urine   . Parkinson disease (HCC) 02/21/2014  . 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 Diabetes (HCC) 02/19/2014  . 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   Current Outpatient Medications on File Prior to Encounter  Medication Sig Dispense Refill  . Carbidopa-Levodopa ER (SINEMET CR) 25-100 MG tablet controlled release Take 1.5 tablets by mouth 3 (three) times daily.    Marland Kitchen CREON 36000-114000 units CPEP capsule TAKE 2 CAPSULES WITH EACH MEAL AND 1 CAPSULE WITH A SNACK-UP TO 7 CAPS PER DAY (Patient taking differently: Take 36,000 Units by mouth See admin instructions. Takes 2  capsules with each meals and 1 capsule with snacks, up to 6-7 capsules per day.) 200 capsule 0  . donepezil (ARICEPT) 5 MG tablet Take 5 mg by mouth at bedtime.    . insulin glargine (LANTUS) 100 unit/mL SOPN Inject 10-20 Units into the skin See admin instructions. Take 20 units in the morning and 10 units at night. Will decrease or increase depending on sugar level.    . insulin lispro (HUMALOG) 100 UNIT/ML injection Inject 5 Units into the skin as needed for high blood sugar (takes if blood sugar over 150).     . MYRBETRIQ 50 MG TB24 tablet Take 50 mg by mouth daily.    . Ropinirole HCl 6 MG TB24 Take 6 mg by mouth daily.    . simvastatin (ZOCOR) 40 MG tablet Take 40 mg by mouth daily. Lowers cholesterol    . triamcinolone cream (KENALOG) 0.1 % Apply 1 application topically daily as needed (for eczema).     . venlafaxine XR (EFFEXOR XR) 75 MG 24 hr capsule Take 225 mg by mouth daily with breakfast.       Past Medical History:  Diagnosis Date  . Anxiety   . Arthritis   . Diabetes mellitus without complication (HCC)   . GERD (gastroesophageal reflux disease)   . Hyperlipidemia   . Incontinence of urine    DR  RON   DAVIS    LOV FEB. 2015  . Neuromuscular disorder (HCC)   . Parkinson disease (HCC)    Just replaced battery 06/2019  . RSD (reflex sympathetic dystrophy)    2005  Past Surgical History:  Procedure Laterality Date  . APPENDECTOMY    . BRAIN SURGERY     deep brain stimulation for parkinsons  . CARDIAC CATHETERIZATION     2005  . COLON SURGERY     resection diverticulitis  . DEEP BRAIN STIMULATOR PLACEMENT     replaced battery 06/2019  . TOTAL KNEE ARTHROPLASTY Right 02/19/2014   Procedure: TOTAL KNEE ARTHROPLASTY;  Surgeon: Velna Ochs, MD;  Location: [facility] OR;  Service: Orthopedics;  Laterality: Right;    Social History   Tobacco Use  . Smoking status: Never Smoker  . Smokeless tobacco: Never Used  Vaping Use  . Vaping Use: Never used  Substance Use Topics   . Alcohol use: Yes    Alcohol/week: 20.0 standard drinks    Types: 20 Cans of beer per week    Comment: 1-2 beers a day  . Drug use: No    Family History  Problem Relation Age of Onset  . Colon cancer Neg Hx   . Esophageal cancer Neg Hx   . Stomach cancer Neg Hx   . Pancreatic cancer Neg Hx     PE: Vitals:   06/22/20 1421 06/22/20 2107 06/23/20 0550 06/23/20 0826  BP: 137/70 135/60 137/62   Pulse: 80 78 72 85  Resp: 18 18 16  (!) 21  Temp: 98.1 F (36.7 C) 98.5 F (36.9 C) 99.2 F (37.3 C) 97.8 F (36.6 C)  TempSrc:  Oral Axillary   SpO2: 100% 100% 100% 95%  Weight:      Height:       Patient appears to be in no acute distress  Patient can respond to his name but does not answer questions well Atraumatic normocephalic head No cervical or supraclavicular lymphadenopathy appreciated No increased work of breathing, Foley catheter draining clear yellow urine Lower extremities are symmetric without appreciable edema No identifiable skin lesions  Recent Labs    06/21/20 1312 06/22/20 0407 06/23/20 0426  WBC 13.7* 14.4* 14.7*  HGB 8.2* 7.9* 7.2*  HCT 24.6* 24.0* 22.5*   Recent Labs    06/21/20 1312 06/22/20 0407 06/23/20 0426  NA 142 143 144  K 4.3 4.4 4.4  CL 117* 121* 121*  CO2 10* 8* 7*  GLUCOSE 102* 83 192*  BUN 81* 82* 84*  CREATININE 4.76* 4.83* 5.05*  CALCIUM 6.1* 6.2* 6.6*   No results for input(s): LABPT, INR in the last 72 hours. No results for input(s): LABURIN in the last 72 hours. Results for orders placed or performed during the hospital encounter of 06/21/20  SARS Coronavirus 2 by RT PCR (hospital order, performed in [facility] hospital lab) Nasopharyngeal Nasopharyngeal Swab     Status: None   Collection Time: 06/21/20  3:20 PM   Specimen: Nasopharyngeal Swab  Result Value Ref Range Status   SARS Coronavirus 2 NEGATIVE NEGATIVE Final    Comment: (NOTE) SARS-CoV-2 target nucleic acids are NOT DETECTED.  The SARS-CoV-2 RNA is  generally detectable in upper and lower respiratory specimens during the acute phase of infection. The lowest concentration of SARS-CoV-2 viral copies this assay can detect is 250 copies / mL. A negative result does not preclude SARS-CoV-2 infection and should not be used as the sole basis for treatment or other patient management decisions.  A negative result may occur with improper specimen collection / handling, submission of specimen other than nasopharyngeal swab, presence of viral mutation(s) within the areas targeted by this assay, and inadequate number of viral copies (<250  copies / mL). A negative result must be combined with clinical observations, patient history, and epidemiological information.  Fact Sheet for Patients:   https://www.fda.gov/media/136312/download  Fact Sheet for Healthcare Providers: https://www.fda.gov/media/136313/download  This test is not yet approved or  cleared by the [place] FDA and has been authorized for detection and/or diagnosis of SARS-CoV-2 by FDA under an Emergency Use Authorization (EUA).  This EUA will remain in effect (meaning this test can be used) for the duration of the COVID-19 declaration under Section 564(b)(1) of the Act, 21 U.S.C. section 360bbb-3(b)(1), unless the authorization is terminated or revoked sooner.  Performed at [facility], [address] [place], [place] [address]   Urine Culture     Status: Abnormal   Collection Time: 06/21/20  4:01 PM   Specimen: Urine, Random  Result Value Ref Range Status   Specimen Description   Final    URINE, RANDOM Performed at [facility], [address] [place], [place] [address]    Special Requests   Final    NONE Performed at [facility], [address] [place], [place] [address]    Culture MULTIPLE SPECIES PRESENT, SUGGEST RECOLLECTION (A)  Final   Report Status 06/22/2020 FINAL  Final  Culture, blood  (routine x 2)     Status: None (Preliminary result)   Collection Time: 06/22/20  1:12 PM   Specimen: BLOOD  Result Value Ref Range Status   Specimen Description   Final    BLOOD LEFT HAND Performed at [facility], [address] [place], [place] [address]    Special Requests   Final    BOTTLES DRAWN AEROBIC ONLY Blood Culture adequate volume Performed at [facility], [address] [place], [place] [address]    Culture   Final    NO GROWTH < 24 HOURS Performed at [facility], [address] [place], [place] [address]    Report Status PENDING  Incomplete  Culture, blood (routine x 2)     Status: None (Preliminary result)   Collection Time: 06/22/20  1:16 PM   Specimen: BLOOD  Result Value Ref Range Status   Specimen Description   Final    BLOOD LEFT ANTECUBITAL Performed at [facility], [address] [place], [place] [address]    Special Requests   Final    BOTTLES DRAWN AEROBIC AND ANAEROBIC Blood Culture adequate volume Performed at [facility], [address] [place], [place] [address]    Culture   Final    NO GROWTH < 24 HOURS Performed at [facility], [address] [place],  [address]    Report Status PENDING  Incomplete    Imaging: Renal US 06/23/20: IMPRESSION: 1.  Increased echogenicity of the bilateral kidneys.  2.  Mild right and moderate left hydronephrosis.  3.  Thick-walled urinary bladder contains mobile debris.  4.  Bilateral ureteral jets are present.   Imp: 78-year-old man with a history of Parkinson's disease currently admitted for weakness/fatigue found to have bilateral hydronephrosis with AKI in setting of UTI.  Recommendations: -Foley catheter has been placed and will trend creatinine -If kidney function improves, recommend repeat imaging to ensure resolution of hydronephrosis in 3 to 4 days -If no change in kidney function or  worsening kidney function may obtain CT scan to better evaluate anatomy/obstruction  Thank you for involving me in this patient's care, I will continue to follow   along. Please page with any further questions or concerns. Natalyn Szymanowski D Ewald Beg

## 2020-06-23 NOTE — Progress Notes (Signed)
Initial Nutrition Assessment  DOCUMENTATION CODES:   Not applicable  INTERVENTION:  - will order Ensure Enlive PRN BID, each supplement provides 350 kcal and 20 grams of protein. - will order Magic Cup BID with meals, each supplement provides 290 kcal and 9 grams of protein.  NUTRITION DIAGNOSIS:   Inadequate oral intake related to lethargy/confusion, acute illness, poor appetite as evidenced by per patient/family report.  GOAL:   Patient will meet greater than or equal to 90% of their needs  MONITOR:   PO intake, Supplement acceptance, Labs, Weight trends  REASON FOR ASSESSMENT:   Malnutrition Screening Tool  ASSESSMENT:   79 y.o. male with medical history significant of Parkinson's disease, type 2 DM, and arthritis. He presented to the ED with concerns of increased weakness and poor PO intake. His family reported to ED staff that he was in his usual state of health until 5 days PTA at which time he lost his appetite and began eating very little. This led to decreased strength to the point of being unable to stand even with assistance on the day PTA. His wife began the process of enrolling him with home hospice PTA.  SLP following and recommended Regular diet, thin liquids which is currently ordered. He ate 50% of dinner last night. He weighed 149 lb on 7/17 and PTA the most recently documented weight was on 09/11/19 when he weighed 162 lb. This indicates 13 lb weight loss (8% body weight) in the past 9 months; not significant for time frame but unable to determine if weight loss occurred more acutely.   Palliative Care and Hospice following and plan is for d/c home with hospice. Notes indicate functional status of 20-30% and poor prognosis/guarded prognosis.   Per notes: - ARF - UTI - possible aspiration PNA - hx of Parkinson's  - nearing EOL  Labs reviewed; CBGs: 162 and 180 mg/dl, BUN: 84 mg/dl, creatinine: 4.31 mg/dl, Ca: 6.6 mg/dl, GFR: 10 ml/min. Medications reviewed;  sliding scale novolog, 36000 units creon TID + PRN. IVF; NS @ 100 ml/hr.    NUTRITION - FOCUSED PHYSICAL EXAM:  did not complete at this time.   Diet Order:   Diet Order            Diet regular Room service appropriate? Yes with Assist; Fluid consistency: Thin  Diet effective now                 EDUCATION NEEDS:   No education needs have been identified at this time  Skin:  Skin Assessment: Reviewed RN Assessment  Last BM:  7/17  Height:   Ht Readings from Last 1 Encounters:  06/21/20 5\' 11"  (1.803 m)    Weight:   Wt Readings from Last 1 Encounters:  06/21/20 67.8 kg     Estimated Nutritional Needs:  Kcal:  1650-1850 kcal Protein:  75-90 grams Fluid:  >/= 1.8 L/day     06/23/20, MS, RD, LDN, CNSC Inpatient Clinical Dietitian RD pager # available in [facility]  After hours/weekend pager # available in [facility]

## 2020-06-23 NOTE — TOC Initial Note (Addendum)
Transition of Care [facility]) - Initial/Assessment Note    Patient Details  Name: Randall [name] MRN: [id number] Date of Birth: 09-Dec-1940  Transition of [facility] CM/SW Contact:    Randall [name], Randall [name] Phone Number: 06/23/2020, 10:54 AM  Clinical Narrative:                 CSW met with the patient at beside to discuss hospice services through [place]. Patient explain she "made a mistake." She thought the physicians office arranged for [facility] "[facility]." Spouse reports,  "so many people called I  thought I was talking with the people here in [place]." Spouse wishes to use [facility] services. Patient called and canceled services with [facility].  CSW notified Randall [name] and  made a referral to Randall [name]. Randall [name] will follow up with the patient spouse today. [place] to discuss DME need w/ patient spouse.  PTAR to transport the patient home.   Expected Discharge Plan: Home w Hospice Care Barriers to Discharge: No Barriers Identified   Patient Goals and CMS Choice Patient states their goals for this hospitalization and ongoing recovery are:: Home with Hospice Care   Choice offered to / list presented to : Spouse  Expected Discharge Plan and Services Expected Discharge Plan: [place] In-house Referral: Hospice / Palliative Care Discharge Planning Services: CM Consult   Living arrangements for the past 2 months: Single Family Home                 DME Arranged:  ([place] to Arrange DME needs-Shower chair w/ rails)   Date DME Agency Contacted: 06/23/20 Time DME Agency Contacted: [id number] Representative spoke with at DME Agency: [place]: Hospice and [place] Date [place]: 06/23/20 Time [place]: 1053 Representative spoke with at [place]: Randall [name]  Prior Living Arrangements/Services Living  arrangements for the past 2 months: [place] with:: Spouse Patient language and need for interpreter reviewed:: No Do you feel safe going back to the place where you live?: Yes      Need for Family Participation in Patient Care: Yes (Comment) Care giver support system in place?: Yes (comment) Current home services: DME Criminal Activity/Legal Involvement Pertinent to Current Situation/Hospitalization: No - Comment as needed  Activities of Daily Living Home Assistive Devices/Equipment: CBG Meter, Eyeglasses, Shower chair without back, Grab bars around toilet, Grab bars in shower, Hand-held shower hose, Stimulators (deep brain stimulator R chest) ADL Screening (condition at time of admission) Patient's cognitive ability adequate to safely complete daily activities?: Yes Is the patient deaf or have difficulty hearing?: No Does the patient have difficulty seeing, even when wearing glasses/contacts?: No Does the patient have difficulty concentrating, remembering, or making decisions?: Yes Patient able to express need for assistance with ADLs?: Yes Does the patient have difficulty dressing or bathing?: No Independently performs ADLs?: Yes (appropriate for developmental age) Does the patient have difficulty walking or climbing stairs?: Yes Weakness of Legs: Both Weakness of Arms/Hands: None  Permission Sought/Granted Permission sought to share information with : Family Supports Permission granted to share information with : Yes, Verbal Permission Granted  Share Information with NAME: RandallHarper Spouse  Permission granted to share info w AGENCY: [place] granted to share info w Relationship: Spouse  Permission granted to share info w Contact Information: [phone]  [phone]  Emotional Assessment Appearance:: Appears stated age Attitude/Demeanor/Rapport: Unable to Assess Affect (typically observed): Unable to Assess   Alcohol / Substance Use: Not  Applicable Psych Involvement: No (comment)  Admission diagnosis:  Hypocalcemia [E83.51] ARF (acute renal failure) (HCC) [N17.9] Weakness [R53.1] Acute UTI [N39.0] AKI (acute kidney injury) ([place]) [N17.9] Patient Active Problem List   Diagnosis Date Noted  . ARF (acute renal failure) ([place]) 06/21/2020  . Acute lower UTI 06/21/2020  . DNR (do not resuscitate) 06/21/2020  . Wound infection after surgery 03/09/2014  . 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 Hyperlipidemia   . Incontinence of urine   . Parkinson disease ([place]) 02/21/2014  . 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 Diabetes ([place]) 02/19/2014  . Idiopathic Parkinson's disease ([place]) 01/14/2014  . 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

## 2020-06-23 NOTE — Progress Notes (Signed)
°   06/23/20 0900  Clinical Encounter Type  Visited With Family;Patient not available  Visit Type Initial;Psychological support;Spiritual support  Referral From Nurse  Consult/Referral To Chaplain  Spiritual Encounters  Spiritual Needs Emotional;Other (Comment) (Spiritual Care Conversation/Support)  Stress Factors  Patient Stress Factors Not reviewed  Family Stress Factors Health changes   I spoke with Randall [name] per spiritual care consult. Randall [name] was asleep at the time of my conversation with his wife.  She did not state any needs at this time. They are members of [employer] and know one of our prn Chaplains, [place].   Please, contact Spiritual Care for further assistance.   Chaplain Clint Bolder M.Div., [facility]

## 2020-06-23 NOTE — Progress Notes (Signed)
PROGRESS NOTE    Randall [name]  EAV:[id number] DOB: 1941-11-18 DOA: 06/21/2020 PCP: Randall Garbeisovec, Richard W, MD   Chief Complaint  Patient presents with   Weakness    Brief Narrative: Randall [name] Randall [name] 79 y.o.malewith medical history significant ofparkinson's disease, DM2, arthritis who presents to the ED with concerns of increased weakness and poor PO intake.Pt is somewhat confused at this time. Majority of history was obtained from pt's wife who is at bedside. Per family , pt had been in his usual state of health until 5 days prior to admit, when he acutely lost his appetite and had only minimal amounts of PO intake. Decreased appetite continued and patient soon lost the majority of his strength, ultimately being unable to stand even with assistance one day prior to admit. Pt has been complaining of nausea during this time and pt's wife reports witnessing aspiration event while pt was drinking fluids on the morning of admission. Pt denies coughing or chest pains.   Of note, patient's wife is currently in the process of establishing pt with home hospice services through "friends."  ED Course:In the ED, pt noted to have BUN/Cr of 81 and 4.76, respectively. WBC noted to be 13.7. Bladder scan revealed around 180cc of urine. UA was notable for large leukocytes, many bacteria, and large blood.CXR was obtained, reviewed, and was found to be clear.Pt was started empirically on rocephin and 1LNS bolus was given. Hospitalist consulted for consideration for admission.  Assessment & Plan:   Principal Problem:   ARF (acute renal failure) (HCC) Active Problems:   Diabetes (HCC)   Parkinson disease (HCC)   S/P total knee arthroplasty   Hyperlipidemia   Benign fibroma of prostate   Acute lower UTI   DNR (do not resuscitate)   1. Goals of Care: appreciate assistance of palliative care and hospice.  He's had progressive renal failure since admission.  Currently plan to d/c with hospice  support.  I think palliative care consult is reasonable, but with bilateral hydro in setting of aki, I think reasonable to see if he might have any improvement with foley catheter placement/discussion with urology prior to plan for discharge.  Will place foley and hear urology recommendations prior to plan for discharge with hospice, will see how he does over next few days.  Per his wife, he was walking with Randall [name] walker, but incontinent of urine at baseline, unable to dress himself, spent [place] lot of time in chair reading [place] times.  2. Acute Kidney Injury   Bilateral Hydronephrosis   Anion Gap Metabolic Acidosis 1. Creatinine gradually worsening - baseline 1.39 in 2020 2. Presented with creatinine 4.76, worsening today 3. Renal US from 7/17 with mild R and moderate L hydronephrosis - will place indwelling foley catheter - discussed with pts wife, will see how his kidney function does after foley placement 4. Appreciate urology assistance 5.  Continue IVF with bicarb - AGMA 2/2 progressive renal failure  3. UTI 1. Pt with UA showing >50 wbc, many bacteria, large blood, large leuks with elevated WBC of 13.7 2. Urine cx with multiple species 3. Blood cx pending 4. Continue with ceftriaxone   4. Macrocytic Anemia: follow iron, b12, folate, ferritin 1. With downtrending H/H, will give 1 unit pRBC's (discussed risks/benefits)  5. Possible aspiration pneumonitis 1. Pt had witnessed aspiration event on AM of admit while trying to drink fluids 2. CXR reviewed, clear 3. On rocephin for UTI above 4. SLP recommending regular diet, think liquids  6. DM2 1. Monitor glucose 2. On insulin prior to admit -may need to decrease or d/c this at discharge 3. SSI 4. A1c 6  7. Parkinson's 1. Seems to be stable at this time 2. Cont home meds as tolerated  8. OA 1. Pt is s/p prior knee surgery 2. Will consult PT/OT - recommending home health  9. HLD 1. On statin prior to admit, will continue  10. Hx fibroma  of prostate 1. Follows Urology at [facility], last seen on 7/12 2. Would cont home regimen  11. Hypocalcemia 1. Presenting calcium of 6.1, repeat 6.2 with alb 3.4 2. Suspect secondary to marked malnutrition 3. Likely hyperphos in setting of AKI 4. Start PO calcium   DVT prophylaxis: SCD Code Status: dnr Family Communication: wife at bedside Disposition:   Status is: Inpatient  Remains inpatient appropriate because:Inpatient level of care appropriate due to severity of illness   Dispo: The patient is from: Home              Anticipated d/c is to: Home              Anticipated d/c date is: > 3 days              Patient currently is not medically stable to d/c.   Consultants:   Palliative care  urology  Procedures:  none  Antimicrobials:  Anti-infectives (From admission, onward)   Start     Dose/Rate Route Frequency Ordered Stop   06/22/20 1800  cefTRIAXone (ROCEPHIN) 1 g in sodium chloride 0.9 % 100 mL IVPB     Discontinue     1 g 200 mL/hr over 30 Minutes Intravenous Every 24 hours 06/21/20 1724     06/21/20 1615  cefTRIAXone (ROCEPHIN) 1 g in sodium chloride 0.9 % 100 mL IVPB        1 g 200 mL/hr over 30 Minutes Intravenous  Once 06/21/20 1601 06/21/20 1851      Subjective: No new complaints Randall [name]&Ox1-2 (didn't know month, knew city, not clearly hospital)  Objective: Vitals:   06/22/20 1421 06/22/20 2107 06/23/20 0550 06/23/20 0826  BP: 137/70 135/60 137/62   Pulse: 80 78 72 85  Resp: 18 18 16  (!) 21  Temp: 98.1 F (36.7 C) 98.5 F (36.9 C) 99.2 F (37.3 C) 97.8 F (36.6 C)  TempSrc:  Oral Axillary   SpO2: 100% 100% 100% 95%  Weight:      Height:        Intake/Output Summary (Last 24 hours) at 06/23/2020 1521 Last data filed at 06/23/2020 0600 Gross per 24 hour  Intake 2187.79 ml  Output 1375 ml  Net 812.79 ml   Filed Weights   06/21/20 1940  Weight: 67.8 kg    Examination:  General exam: Appears calm and comfortable  Respiratory  system: Clear to auscultation. Respiratory effort normal. Cardiovascular system: S1 & S2 heard, RRR. Gastrointestinal system: Abdomen is nondistended, soft and nontender.  Central nervous system: Alert and oriented x1-2. No focal neurological deficits. Extremities: no LEE Skin: No rashes, lesions or ulcers Psychiatry: Judgement and insight appear normal. Mood & affect appropriate.     Data Reviewed: I have personally reviewed following labs and imaging studies  CBC: Recent Labs  Lab 06/21/20 1312 06/22/20 0407 06/23/20 0426  WBC 13.7* 14.4* 14.7*  NEUTROABS 11.9*  --  12.8*  HGB 8.2* 7.9* 7.2*  HCT 24.6* 24.0* 22.5*  MCV 101.7* 102.6* 105.6*  PLT 167 164 158  Basic Metabolic Panel: Recent Labs  Lab 06/21/20 1312 06/22/20 0407 06/23/20 0426  NA 142 143 144  K 4.3 4.4 4.4  CL 117* 121* 121*  CO2 10* 8* 7*  GLUCOSE 102* 83 192*  BUN 81* 82* 84*  CREATININE 4.76* 4.83* 5.05*  CALCIUM 6.1* 6.2* 6.6*    GFR: Estimated Creatinine Clearance: 11.4 mL/min ([place]) (by C-G formula based on SCr of 5.05 mg/dL (H)).  Liver Function Tests: Recent Labs  Lab 06/21/20 1312 06/22/20 0407  AST 14* 11*  ALT 8 7  ALKPHOS 179* 160*  BILITOT 0.2* 0.4  PROT 6.6 6.1*  ALBUMIN 3.6 3.4*    CBG: Recent Labs  Lab 06/22/20 1202 06/22/20 1656 06/22/20 2111 06/23/20 0737 06/23/20 1159  GLUCAP 172* 270* 242* 162* 180*     Recent Results (from the past 240 hour(s))  SARS Coronavirus 2 by RT PCR (hospital order, performed in [facility] hospital lab) Nasopharyngeal Nasopharyngeal Swab     Status: None   Collection Time: 06/21/20  3:20 PM   Specimen: Nasopharyngeal Swab  Result Value Ref Range Status   SARS Coronavirus 2 NEGATIVE NEGATIVE Final    Comment: (NOTE) SARS-CoV-2 target nucleic acids are NOT DETECTED.  The SARS-CoV-2 RNA is generally detectable in upper and lower respiratory specimens during the acute phase of infection. The lowest concentration of SARS-CoV-2 viral  copies this assay can detect is 250 copies / mL. Traycen Goyer negative result does not preclude SARS-CoV-2 infection and should not be used as the sole basis for treatment or other patient management decisions.  Nidia Grogan negative result may occur with improper specimen collection / handling, submission of specimen other than nasopharyngeal swab, presence of viral mutation(s) within the areas targeted by this assay, and inadequate number of viral copies (<250 copies / mL). Sahasra Belue negative result must be combined with clinical observations, patient history, and epidemiological information.  Fact Sheet for Patients:   BoilerBrush.com.cy  Fact Sheet for Healthcare Providers: https://pope.com/  This test is not yet approved or  cleared by the [place] FDA and has been authorized for detection and/or diagnosis of SARS-CoV-2 by FDA under an Emergency Use Authorization (EUA).  This EUA will remain in effect (meaning this test can be used) for the duration of the COVID-19 declaration under Section 564(b)(1) of the Act, 21 U.S.C. section 360bbb-3(b)(1), unless the authorization is terminated or revoked sooner.  Performed at [facility], [name] [place], [place] [address]   Urine Culture     Status: Abnormal   Collection Time: 06/21/20  4:01 PM   Specimen: Urine, Random  Result Value Ref Range Status   Specimen Description   Final    URINE, RANDOM Performed at [facility], [name], [place], [place] [address]    Special Requests   Final    NONE Performed at [facility], [name], [place], [place] [address]    Culture MULTIPLE SPECIES PRESENT, SUGGEST RECOLLECTION (Trinten Boudoin)  Final   Report Status 06/22/2020 FINAL  Final  Culture, blood (routine x 2)     Status: None (Preliminary result)   Collection Time: 06/22/20  1:12 PM   Specimen: BLOOD  Result Value Ref Range Status   Specimen  Description   Final    BLOOD LEFT HAND Performed at [facility], [name], [place], [place] [address]    Special Requests   Final    BOTTLES DRAWN AEROBIC ONLY Blood Culture adequate volume Performed  at [facility], [name] [place], [place] [address]    Culture   Final    NO GROWTH < 24 HOURS Performed at [facility], [address] [place], [place] [address]    Report Status PENDING  Incomplete  Culture, blood (routine x 2)     Status: None (Preliminary result)   Collection Time: 06/22/20  1:16 PM   Specimen: BLOOD  Result Value Ref Range Status   Specimen Description   Final    BLOOD LEFT ANTECUBITAL Performed at [facility], [name], [place], [place] [address]    Special Requests   Final    BOTTLES DRAWN AEROBIC AND ANAEROBIC Blood Culture adequate volume Performed at [facility], [name], [place], [place] [address]    Culture   Final    NO GROWTH < 24 HOURS Performed at [facility], [address], [place], [place] [address]    Report Status PENDING  Incomplete         Radiology Studies: US RENAL  Result Date: 06/21/2020 CLINICAL DATA:  Acute renal failure. EXAM: RENAL / URINARY TRACT ULTRASOUND COMPLETE COMPARISON:  None. FINDINGS: Right Kidney: Renal measurements: 10.1 x 5.3 x 5.7 cm = volume: 159 mL. Increased echogenicity. Mild right hydronephrosis. Left Kidney: Renal measurements: 12.4 x 6.8 x 5.8 cm = volume: 255 mL. Increased echogenicity. Moderate hydronephrosis. Bladder: Thick-walled with mobile debris.  Bilateral ureteral jets seen. Other: None. IMPRESSION: 1.  Increased echogenicity of the bilateral kidneys. 2.  Mild right and moderate left hydronephrosis. 3.  Thick-walled urinary bladder contains mobile debris. 4.  Bilateral ureteral jets are present. Electronically Signed   By: Ted Mcalpine M.D.   On: 06/21/2020 18:29         Scheduled Meds:  Carbidopa-Levodopa ER  1.5 tablet Oral TID   donepezil  5 mg Oral QHS   insulin aspart  0-5 Units Subcutaneous QHS   insulin aspart  0-9 Units Subcutaneous TID WC   lipase/protease/amylase  36,000 Units Oral TID with meals   rOPINIRole  2 mg Oral QHS   venlafaxine XR  225 mg Oral Q breakfast   Continuous Infusions:  sodium chloride Stopped (06/22/20 1714)   cefTRIAXone (ROCEPHIN)  IV 1 g (06/22/20 1714)     LOS: 2 days    Time spent: over 30 min    Lacretia Nicks, MD [facility]   To contact the attending provider between 7A-7P or the covering provider during after hours 7P-7A, please log into the web site [email or web address] and access using universal [place] password for that web site. If you do not have the password, please call the hospital operator.  06/23/2020, 3:21 PM

## 2020-06-23 NOTE — Progress Notes (Signed)
[facility]  received a referral from PCP office Dr. Wylene Simmer on 06/20/20 to enroll pt to hospice services at home. However, when we reached out to the family they did not want [place] to come until Thursday 06/26/20 for enrollment to the program at home. He unfortunately has decline rapidly and now has been hospitalized. Please reach out to me if we can help in anyway during this hospitalization--   Norm Parcel RN [phone]

## 2020-06-23 NOTE — Progress Notes (Signed)
   Daily Progress Note   Patient Name: Randall [name]       Date: 06/23/2020 DOB: 01-28-1941  Age: 79 y.o. MRN#: [id number] Attending Physician: Zigmund Daniel., * Primary Care Physician: Gaspar Garbe, MD Admit Date: 06/21/2020  Reason for Consultation/Follow-up: Establishing goals of care  Subjective: Patient awake and alert in bed. Answers questions appropriately. Wife is at the bedside feeding patient a baked potato which he is tolerating without complications. Patient expresses his appreciation of the food and ability to eat. Denies pain or shortness. Is hopeful he will be able to return home with wife soon.   Discussed updates and previous GOC discussion with wife. Patient has foley catheter placed and wife is hopeful for some improvement regarding renal function. Support given. Wife and patient request to treat the treatable with watchful waiting and hopes for some improvement/stability.   All questions and support provided.   Length of Stay: 2 days  Vital Signs: BP 137/62 (BP Location: Left Arm)   Pulse 85   Temp 97.8 F (36.6 C)   Resp (!) 21   Ht 5\' 11"  (1.803 m)   Wt 67.8 kg   SpO2 95%   BMI 20.85 kg/m  SpO2: SpO2: 95 % O2 Device: O2 Device: Room Air O2 Flow Rate:    Physical Exam: -awake, A&O x2, chronically-ill appearing -normal breathing pattern -mood appropriate, follows commands             Palliative Care Assessment & Plan    Code Status:  DNR  Goals of Care/Recommendations:  Continue to treat the treatable  Wife/Patient hopeful for some improvement with foley catheter in regards to renal function/hydronephrosis. Pending Urology/Attending recommendations. This is reasonable for symptom management and aligns with their goals of care.   Wife is realistic in understanding patient's overall condition including co-morbidities and current illness and disease trajectory. She reports a decline in quality of life over the past several months to  most recent days.   Plan for outpatient hospice support. PMT will continue to follow and support for further changes or needs.   Prognosis: GUARDED   Discharge Planning: To Be Determined (Possibly home with outpatient hospice support)  Thank you for allowing the Palliative Medicine Team to assist in the care of this patient.  Time Total: 35 min.   Visit consisted of counseling and education dealing with the complex and emotionally intense issues of symptom management and palliative care in the setting of serious and potentially life-threatening illness.Greater than 50%  of this time was spent counseling and coordinating care related to the above assessment and plan.  , AGPCNP-BC  Palliative Medicine Team [phone]

## 2020-06-23 NOTE — H&P (View-Only) (Signed)
I have been asked to see the patient by Dr. Skeet Simmer, for evaluation and management of bilateral hydronephrosis.  History of present illness: 79 year old man with a history of Parkinson's disease presented to the ER with increased weakness and poor p.o. intake.  Patient's wife provided history today.  Urology was consulted after renal ultrasound showed mild right and moderate left hydronephrosis with thick-walled urinary bladder and debris.  Patient receives urologic care at [facility].  Per wife patient has been struggling with urinary incontinence and has recently tried a condom catheter.  She is unaware of any BPH diagnosis.   Creatinine 4.76 on admission and now 5.05.  Urinalysis with greater than 50 WBCs and many bacteria.   Review of systems: A 12 point comprehensive review of systems was obtained and is negative unless otherwise stated in the history of present illness.  Patient Active Problem List   Diagnosis Date Noted  . ARF (acute renal failure) (HCC) 06/21/2020  . Acute lower UTI 06/21/2020  . DNR (do not resuscitate) 06/21/2020  . Wound infection after surgery 03/09/2014  . 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 Hyperlipidemia   . Incontinence of urine   . Parkinson disease (HCC) 02/21/2014  . 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 Diabetes (HCC) 02/19/2014  . 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   Current Outpatient Medications on File Prior to Encounter  Medication Sig Dispense Refill  . Carbidopa-Levodopa ER (SINEMET CR) 25-100 MG tablet controlled release Take 1.5 tablets by mouth 3 (three) times daily.    Marland Kitchen CREON 36000-114000 units CPEP capsule TAKE 2 CAPSULES WITH EACH MEAL AND 1 CAPSULE WITH A SNACK-UP TO 7 CAPS PER DAY (Patient taking differently: Take 36,000 Units by mouth See admin instructions. Takes 2  capsules with each meals and 1 capsule with snacks, up to 6-7 capsules per day.) 200 capsule 0  . donepezil (ARICEPT) 5 MG tablet Take 5 mg by mouth at bedtime.    . insulin glargine (LANTUS) 100 unit/mL SOPN Inject 10-20 Units into the skin See admin instructions. Take 20 units in the morning and 10 units at night. Will decrease or increase depending on sugar level.    . insulin lispro (HUMALOG) 100 UNIT/ML injection Inject 5 Units into the skin as needed for high blood sugar (takes if blood sugar over 150).     . MYRBETRIQ 50 MG TB24 tablet Take 50 mg by mouth daily.    . Ropinirole HCl 6 MG TB24 Take 6 mg by mouth daily.    . simvastatin (ZOCOR) 40 MG tablet Take 40 mg by mouth daily. Lowers cholesterol    . triamcinolone cream (KENALOG) 0.1 % Apply 1 application topically daily as needed (for eczema).     . venlafaxine XR (EFFEXOR XR) 75 MG 24 hr capsule Take 225 mg by mouth daily with breakfast.       Past Medical History:  Diagnosis Date  . Anxiety   . Arthritis   . Diabetes mellitus without complication (HCC)   . GERD (gastroesophageal reflux disease)   . Hyperlipidemia   . Incontinence of urine    DR  RON   DAVIS    LOV FEB. 2015  . Neuromuscular disorder (HCC)   . Parkinson disease (HCC)    Just replaced battery 06/2019  . RSD (reflex sympathetic dystrophy)    2005  Past Surgical History:  Procedure Laterality Date  . APPENDECTOMY    . BRAIN SURGERY     deep brain stimulation for parkinsons  . CARDIAC CATHETERIZATION     2005  . COLON SURGERY     resection diverticulitis  . DEEP BRAIN STIMULATOR PLACEMENT     replaced battery 06/2019  . TOTAL KNEE ARTHROPLASTY Right 02/19/2014   Procedure: TOTAL KNEE ARTHROPLASTY;  Surgeon: Velna Ochs, MD;  Location: [facility] OR;  Service: Orthopedics;  Laterality: Right;    Social History   Tobacco Use  . Smoking status: Never Smoker  . Smokeless tobacco: Never Used  Vaping Use  . Vaping Use: Never used  Substance Use Topics   . Alcohol use: Yes    Alcohol/week: 20.0 standard drinks    Types: 20 Cans of beer per week    Comment: 1-2 beers a day  . Drug use: No    Family History  Problem Relation Age of Onset  . Colon cancer Neg Hx   . Esophageal cancer Neg Hx   . Stomach cancer Neg Hx   . Pancreatic cancer Neg Hx     PE: Vitals:   06/22/20 1421 06/22/20 2107 06/23/20 0550 06/23/20 0826  BP: 137/70 135/60 137/62   Pulse: 80 78 72 85  Resp: 18 18 16  (!) 21  Temp: 98.1 F (36.7 C) 98.5 F (36.9 C) 99.2 F (37.3 C) 97.8 F (36.6 C)  TempSrc:  Oral Axillary   SpO2: 100% 100% 100% 95%  Weight:      Height:       Patient appears to be in no acute distress  Patient can respond to his name but does not answer questions well Atraumatic normocephalic head No cervical or supraclavicular lymphadenopathy appreciated No increased work of breathing, Foley catheter draining clear yellow urine Lower extremities are symmetric without appreciable edema No identifiable skin lesions  Recent Labs    06/21/20 1312 06/22/20 0407 06/23/20 0426  WBC 13.7* 14.4* 14.7*  HGB 8.2* 7.9* 7.2*  HCT 24.6* 24.0* 22.5*   Recent Labs    06/21/20 1312 06/22/20 0407 06/23/20 0426  NA 142 143 144  K 4.3 4.4 4.4  CL 117* 121* 121*  CO2 10* 8* 7*  GLUCOSE 102* 83 192*  BUN 81* 82* 84*  CREATININE 4.76* 4.83* 5.05*  CALCIUM 6.1* 6.2* 6.6*   No results for input(s): LABPT, INR in the last 72 hours. No results for input(s): LABURIN in the last 72 hours. Results for orders placed or performed during the hospital encounter of 06/21/20  SARS Coronavirus 2 by RT PCR (hospital order, performed in [facility] hospital lab) Nasopharyngeal Nasopharyngeal Swab     Status: None   Collection Time: 06/21/20  3:20 PM   Specimen: Nasopharyngeal Swab  Result Value Ref Range Status   SARS Coronavirus 2 NEGATIVE NEGATIVE Final    Comment: (NOTE) SARS-CoV-2 target nucleic acids are NOT DETECTED.  The SARS-CoV-2 RNA is  generally detectable in upper and lower respiratory specimens during the acute phase of infection. The lowest concentration of SARS-CoV-2 viral copies this assay can detect is 250 copies / mL. A negative result does not preclude SARS-CoV-2 infection and should not be used as the sole basis for treatment or other patient management decisions.  A negative result may occur with improper specimen collection / handling, submission of specimen other than nasopharyngeal swab, presence of viral mutation(s) within the areas targeted by this assay, and inadequate number of viral copies (<250  copies / mL). A negative result must be combined with clinical observations, patient history, and epidemiological information.  Fact Sheet for Patients:   BoilerBrush.com.cyhttps://www.fda.gov/media/136312/download  Fact Sheet for Healthcare Providers: https://pope.com/https://www.fda.gov/media/136313/download  This test is not yet approved or  cleared by the [place] FDA and has been authorized for detection and/or diagnosis of SARS-CoV-2 by FDA under an Emergency Use Authorization (EUA).  This EUA will remain in effect (meaning this test can be used) for the duration of the COVID-19 declaration under Section 564(b)(1) of the Act, 21 U.S.C. section 360bbb-3(b)(1), unless the authorization is terminated or revoked sooner.  Performed at [facility], [address] [place], [place] [address]   Urine Culture     Status: Abnormal   Collection Time: 06/21/20  4:01 PM   Specimen: Urine, Random  Result Value Ref Range Status   Specimen Description   Final    URINE, RANDOM Performed at [facility], [address] [place], [place] [address]    Special Requests   Final    NONE Performed at [facility], [address] [place], [place] [address]    Culture MULTIPLE SPECIES PRESENT, SUGGEST RECOLLECTION (A)  Final   Report Status 06/22/2020 FINAL  Final  Culture, blood  (routine x 2)     Status: None (Preliminary result)   Collection Time: 06/22/20  1:12 PM   Specimen: BLOOD  Result Value Ref Range Status   Specimen Description   Final    BLOOD LEFT HAND Performed at [facility], [address] [place], [place] [address]    Special Requests   Final    BOTTLES DRAWN AEROBIC ONLY Blood Culture adequate volume Performed at [facility], [address] [place], [place] [address]    Culture   Final    NO GROWTH < 24 HOURS Performed at [facility], [address] [place], [place] [address]    Report Status PENDING  Incomplete  Culture, blood (routine x 2)     Status: None (Preliminary result)   Collection Time: 06/22/20  1:16 PM   Specimen: BLOOD  Result Value Ref Range Status   Specimen Description   Final    BLOOD LEFT ANTECUBITAL Performed at [facility], [address] [place], [place] [address]    Special Requests   Final    BOTTLES DRAWN AEROBIC AND ANAEROBIC Blood Culture adequate volume Performed at [facility], [address] [place], [place] [address]    Culture   Final    NO GROWTH < 24 HOURS Performed at [facility], [address] [place], [place] [address]    Report Status PENDING  Incomplete    Imaging: Renal US 06/23/20: IMPRESSION: 1.  Increased echogenicity of the bilateral kidneys.  2.  Mild right and moderate left hydronephrosis.  3.  Thick-walled urinary bladder contains mobile debris.  4.  Bilateral ureteral jets are present.   Imp: 79 year old man with a history of Parkinson's disease currently admitted for weakness/fatigue found to have bilateral hydronephrosis with AKI in setting of UTI.  Recommendations: -Foley catheter has been placed and will trend creatinine -If kidney function improves, recommend repeat imaging to ensure resolution of hydronephrosis in 3 to 4 days -If no change in kidney function or  worsening kidney function may obtain CT scan to better evaluate anatomy/obstruction  Thank you for involving me in this patient's care, I will continue to follow  along. Please page with any further questions or concerns. Yazmyne Sara D Weslee Fogg

## 2020-06-23 NOTE — Progress Notes (Signed)
Patent examiner [facility]) Hospital Liaison: RN note     Notified by Transition of Care Manger Vivi Barrack, CSW of patient/family request for [facility] services at home after discharge. Chart and patient information under review by [facility] physician. Hospice eligibility pending currently.     Writer spoke with wife, [place] to initiate education related to hospice philosophy, services and team approach to care. [place] verbalized understanding of information given. Please send signed and completed DNR form home with patient/family. Patient will need prescriptions for discharge comfort medications.      DME needs have been discussed, patient currently has the following equipment in the home:  walker.  Patient/family requests the following DME for delivery to the home: shower chair. [facility] equipment manager has been notified and will contact DME provider to arrange delivery to the home. Home address has been verified and is correct in the chart.  Fara Boros is the family member to contact to arrange time of delivery.      [facility] aware of the above. Please notify [facility] when patient is ready to leave the unit at discharge. (Call [phone] or [phone] after 5pm.) [facility] information and contact numbers given to [place].       Please call with any hospice related questions.      Thank you for this referral.     Elsie Saas, RN, [facility] (listed on AMION under [facility])   [phone]

## 2020-06-24 ENCOUNTER — Inpatient Hospital Stay (HOSPITAL_COMMUNITY): Payer: Medicare Other

## 2020-06-24 LAB — COMPREHENSIVE METABOLIC PANEL
ALT: 5 U/L (ref 0–44)
AST: 10 U/L — ABNORMAL LOW (ref 15–41)
Albumin: 2.8 g/dL — ABNORMAL LOW (ref 3.5–5.0)
Alkaline Phosphatase: 122 U/L (ref 38–126)
Anion gap: 12 (ref 5–15)
BUN: 87 mg/dL — ABNORMAL HIGH (ref 8–23)
CO2: 12 mmol/L — ABNORMAL LOW (ref 22–32)
Calcium: 6.2 mg/dL — CL (ref 8.9–10.3)
Chloride: 120 mmol/L — ABNORMAL HIGH (ref 98–111)
Creatinine, Ser: 5.11 mg/dL — ABNORMAL HIGH (ref 0.61–1.24)
GFR calc Af Amer: 11 mL/min — ABNORMAL LOW (ref >60)
GFR calc non Af Amer: 10 mL/min — ABNORMAL LOW (ref >60)
Glucose, Bld: 207 mg/dL — ABNORMAL HIGH (ref 70–99)
Potassium: 3.5 mmol/L (ref 3.5–5.1)
Sodium: 144 mmol/L (ref 135–145)
Total Bilirubin: 0.7 mg/dL (ref 0.3–1.2)
Total Protein: 5.6 g/dL — ABNORMAL LOW (ref 6.5–8.1)

## 2020-06-24 LAB — BPAM RBC
Blood Product Expiration Date: 202108072359
ISSUE DATE / TIME: 202107191843
Unit Type and Rh: 5100

## 2020-06-24 LAB — PHOSPHORUS: Phosphorus: 5 mg/dL — ABNORMAL HIGH (ref 2.5–4.6)

## 2020-06-24 LAB — CBC WITH DIFFERENTIAL/PLATELET
Abs Immature Granulocytes: 0.13 10*3/uL — ABNORMAL HIGH (ref 0.00–0.07)
Basophils Absolute: 0 10*3/uL (ref 0.0–0.1)
Basophils Relative: 0 %
Eosinophils Absolute: 0 10*3/uL (ref 0.0–0.5)
Eosinophils Relative: 0 %
HCT: 22.8 % — ABNORMAL LOW (ref 39.0–52.0)
Hemoglobin: 7.6 g/dL — ABNORMAL LOW (ref 13.0–17.0)
Immature Granulocytes: 1 %
Lymphocytes Relative: 4 %
Lymphs Abs: 0.5 10*3/uL — ABNORMAL LOW (ref 0.7–4.0)
MCH: 33.5 pg (ref 26.0–34.0)
MCHC: 33.3 g/dL (ref 30.0–36.0)
MCV: 100.4 fL — ABNORMAL HIGH (ref 80.0–100.0)
Monocytes Absolute: 0.8 10*3/uL (ref 0.1–1.0)
Monocytes Relative: 7 %
Neutro Abs: 11 10*3/uL — ABNORMAL HIGH (ref 1.7–7.7)
Neutrophils Relative %: 88 %
Platelets: 160 10*3/uL (ref 150–400)
RBC: 2.27 MIL/uL — ABNORMAL LOW (ref 4.22–5.81)
RDW: 14.6 % (ref 11.5–15.5)
WBC: 12.5 10*3/uL — ABNORMAL HIGH (ref 4.0–10.5)
nRBC: 0 % (ref 0.0–0.2)

## 2020-06-24 LAB — TYPE AND SCREEN
ABO/RH(D): O POS
Antibody Screen: NEGATIVE
Unit division: 0

## 2020-06-24 LAB — GLUCOSE, CAPILLARY
Glucose-Capillary: 170 mg/dL — ABNORMAL HIGH (ref 70–99)
Glucose-Capillary: 293 mg/dL — ABNORMAL HIGH (ref 70–99)
Glucose-Capillary: 221 mg/dL — ABNORMAL HIGH (ref 70–99)
Glucose-Capillary: 377 mg/dL — ABNORMAL HIGH (ref 70–99)

## 2020-06-24 LAB — MAGNESIUM: Magnesium: 1.5 mg/dL — ABNORMAL LOW (ref 1.7–2.4)

## 2020-06-24 MED ORDER — CALCIUM CARBONATE ANTACID 500 MG PO CHEW
400.0000 mg | CHEWABLE_TABLET | Freq: Three times a day (TID) | ORAL | Status: DC
  Administered 2020-06-24 – 2020-06-26 (×7): 400 mg via ORAL
  Filled 2020-06-24 (×7): qty 2

## 2020-06-24 MED ORDER — MAGNESIUM OXIDE 400 (241.3 MG) MG PO TABS
400.0000 mg | ORAL_TABLET | Freq: Two times a day (BID) | ORAL | Status: DC
  Administered 2020-06-24 – 2020-06-29 (×10): 400 mg via ORAL
  Filled 2020-06-24 (×10): qty 1

## 2020-06-24 MED ORDER — CALCIUM GLUCONATE-NACL 2-0.675 GM/100ML-% IV SOLN
2.0000 g | Freq: Once | INTRAVENOUS | Status: AC
  Administered 2020-06-24: 2000 mg via INTRAVENOUS
  Filled 2020-06-24: qty 100

## 2020-06-24 NOTE — Progress Notes (Signed)
Urology Inpatient Progress Report  Hypocalcemia [E83.51] ARF (acute renal failure) (HCC) [N17.9] Weakness [R53.1] Acute UTI [N39.0] AKI (acute kidney injury) (HCC) [N17.9]        Intv/Subj: Patient is without complaint.  He denies abdominal pain or back pain.  Kidney function slightly worsened this morning.    Principal Problem:   ARF (acute renal failure) (HCC) Active Problems:   Diabetes (HCC)   Parkinson disease (HCC)   S/P total knee arthroplasty   Hyperlipidemia   Benign fibroma of prostate   Acute lower UTI   DNR (do not resuscitate)  Current Facility-Administered Medications  Medication Dose Route Frequency Provider Last Rate Last Admin  . 0.9 %  sodium chloride infusion (Manually program via Guardrails IV Fluids)   Intravenous Once Zigmund Daniel., MD   Stopped at 06/23/20 1631  . acetaminophen (TYLENOL) tablet 650 mg  650 mg Oral Q6H PRN Jerald Kief, MD   650 mg at 06/23/20 2152   Or  . acetaminophen (TYLENOL) suppository 650 mg  650 mg Rectal Q6H PRN Jerald Kief, MD      . albuterol (PROVENTIL) (2.5 MG/3ML) 0.083% nebulizer solution 2.5 mg  2.5 mg Nebulization Q6H PRN Zigmund Daniel., MD   2.5 mg at 06/23/20 2228  . calcium carbonate (TUMS - dosed in mg elemental calcium) chewable tablet 400 mg of elemental calcium  400 mg of elemental calcium Oral TID Marikay Alar, FNP   400 mg of elemental calcium at 06/24/20 0909  . Carbidopa-Levodopa ER (SINEMET CR) 25-100 MG tablet controlled release 1.5 tablet  1.5 tablet Oral TID Jerald Kief, MD   1.5 tablet at 06/24/20 0910  . cefTRIAXone (ROCEPHIN) 1 g in sodium chloride 0.9 % 100 mL IVPB  1 g Intravenous Q24H Jerald Kief, MD   Stopped at 06/23/20 1701  . Chlorhexidine Gluconate Cloth 2 % PADS 6 each  6 each Topical Daily Zigmund Daniel., MD   6 each at 06/23/20 2200  . donepezil (ARICEPT) tablet 5 mg  5 mg Oral QHS Jerald Kief, MD   5 mg at 06/23/20 2153  . feeding supplement  (ENSURE ENLIVE) (ENSURE ENLIVE) liquid 237 mL  237 mL Oral BID PRN Zigmund Daniel., MD      . ferrous sulfate tablet 325 mg  325 mg Oral Q breakfast Zigmund Daniel., MD   325 mg at 06/24/20 0854  . insulin aspart (novoLOG) injection 0-5 Units  0-5 Units Subcutaneous QHS Jerald Kief, MD   2 Units at 06/22/20 2210  . insulin aspart (novoLOG) injection 0-9 Units  0-9 Units Subcutaneous TID WC Jerald Kief, MD   2 Units at 06/24/20 [phone]  . lipase/protease/amylase (CREON) capsule 36,000 Units  36,000 Units Oral TID with meals Jerald Kief, MD   36,000 Units at 06/24/20 0911  . lipase/protease/amylase (CREON) capsule 36,000 Units  36,000 Units Oral PRN Jerald Kief, MD      . magnesium oxide (MAG-OX) tablet 400 mg  400 mg Oral BID Zigmund Daniel., MD   400 mg at 06/24/20 0909  . Resource ThickenUp Clear   Oral PRN Spongberg, Susy Frizzle, MD      . rOPINIRole (REQUIP) tablet 2 mg  2 mg Oral QHS Marzetta Board, MD   2 mg at 06/23/20 2153  . sodium bicarbonate 150 mEq in sterile water 1,000 mL infusion   Intravenous Continuous Zigmund Daniel., MD 100  mL/hr at 06/23/20 1629 New Bag at 06/23/20 1629  . venlafaxine XR (EFFEXOR-XR) 24 hr capsule 225 mg  225 mg Oral Q breakfast Jerald Kief, MD   225 mg at 06/24/20 0857  . vitamin B-12 (CYANOCOBALAMIN) tablet 1,000 mcg  1,000 mcg Oral Daily Zigmund Daniel., MD   1,000 mcg at 06/24/20 0910     Objective: Vital: Vitals:   06/24/20 0250 06/24/20 0655 06/24/20 1136 06/24/20 1321  BP: 119/64 (!) 121/48 136/62 (!) 145/69  Pulse: 71 69 79 79  Resp: 20 20 18 20   Temp: 99.1 F (37.3 C) 99.1 F (37.3 C) 97.7 F (36.5 C)   TempSrc:  Oral    SpO2: 98% 98% 99% 98%  Weight:      Height:       I/Os: I/O last 3 completed shifts: In: 3266.8 [P.O.:180; I.V.:2464.4; Blood:330; IV Piggyback:292.5] Out: 3475 [Urine:3475]  Physical Exam:  General: Patient is in no apparent distress Lungs: Normal  respiratory effort, chest expands symmetrically. GI: The abdomen is soft and nontender Foley: draining clear yellow urine Ext: lower extremities symmetric  Lab Results: Recent Labs    06/22/20 0407 06/23/20 0426 06/24/20 0334  WBC 14.4* 14.7* 12.5*  HGB 7.9* 7.2* 7.6*  HCT 24.0* 22.5* 22.8*   Recent Labs    06/22/20 0407 06/23/20 0426 06/24/20 0334  NA 143 144 144  K 4.4 4.4 3.5  CL 121* 121* 120*  CO2 8* 7* 12*  GLUCOSE 83 192* 207*  BUN 82* 84* 87*  CREATININE 4.83* 5.05* 5.11*  CALCIUM 6.2* 6.6* 6.2*   No results for input(s): LABPT, INR in the last 72 hours. No results for input(s): LABURIN in the last 72 hours. Results for orders placed or performed during the hospital encounter of 06/21/20  SARS Coronavirus 2 by RT PCR (hospital order, performed in [facility] hospital lab) Nasopharyngeal Nasopharyngeal Swab     Status: None   Collection Time: 06/21/20  3:20 PM   Specimen: Nasopharyngeal Swab  Result Value Ref Range Status   SARS Coronavirus 2 NEGATIVE NEGATIVE Final    Comment: (NOTE) SARS-CoV-2 target nucleic acids are NOT DETECTED.  The SARS-CoV-2 RNA is generally detectable in upper and lower respiratory specimens during the acute phase of infection. The lowest concentration of SARS-CoV-2 viral copies this assay can detect is 250 copies / mL. A negative result does not preclude SARS-CoV-2 infection and should not be used as the sole basis for treatment or other patient management decisions.  A negative result may occur with improper specimen collection / handling, submission of specimen other than nasopharyngeal swab, presence of viral mutation(s) within the areas targeted by this assay, and inadequate number of viral copies (<250 copies / mL). A negative result must be combined with clinical observations, patient history, and epidemiological information.  Fact Sheet for Patients:   06/23/20  Fact Sheet for Healthcare  Providers: BoilerBrush.com.cy  This test is not yet approved or  cleared by the https://pope.com/ FDA and has been authorized for detection and/or diagnosis of SARS-CoV-2 by FDA under an Emergency Use Authorization (EUA).  This EUA will remain in effect (meaning this test can be used) for the duration of the COVID-19 declaration under Section 564(b)(1) of the Act, 21 U.S.C. section 360bbb-3(b)(1), unless the authorization is terminated or revoked sooner.  Performed at [facility], [address], [place], [place]   Urine Culture     Status: Abnormal   Collection Time: 06/21/20  4:01 PM   Specimen: Urine, Random  Result Value Ref Range Status   Specimen Description   Final    URINE, RANDOM Performed at [facility], [address] [place], [place] [address]    Special Requests   Final    NONE Performed at [facility], [address], [place], [place] [address]    Culture MULTIPLE SPECIES PRESENT, SUGGEST RECOLLECTION (A)  Final   Report Status 06/22/2020 FINAL  Final  Culture, blood (routine x 2)     Status: None (Preliminary result)   Collection Time: 06/22/20  1:12 PM   Specimen: BLOOD  Result Value Ref Range Status   Specimen Description   Final    BLOOD LEFT HAND Performed at [facility], [address] [place], [place] [address]    Special Requests   Final    BOTTLES DRAWN AEROBIC ONLY Blood Culture adequate volume Performed at [facility], [address] [place], [place] [address]    Culture   Final    NO GROWTH 2 DAYS Performed at [facility], [address], [place], [place] [address]    Report Status PENDING  Incomplete  Culture, blood (routine x 2)     Status: None (Preliminary result)   Collection Time: 06/22/20  1:16 PM   Specimen: BLOOD  Result Value Ref Range Status   Specimen Description   Final    BLOOD LEFT  ANTECUBITAL Performed at [facility], [address] [place], [place] [address]    Special Requests   Final    BOTTLES DRAWN AEROBIC AND ANAEROBIC Blood Culture adequate volume Performed at [facility], [address] [place], [place] [address]    Culture   Final    NO GROWTH 2 DAYS Performed at [facility], [address], [place], [place] [address]    Report Status PENDING  Incomplete    Studies/Results: DG CHEST PORT 1 VIEW  Result Date: 06/23/2020 CLINICAL DATA:  Aspiration EXAM: PORTABLE CHEST 1 VIEW COMPARISON:  06/21/2020 FINDINGS: Lungs are clear. No pneumothorax or pleural effusion. Cardiac size within normal limits. Pulmonary vascularity is normal. Neurostimulator battery pack overlies the right hemithorax with the electrode extending superiorly, unchanged. IMPRESSION: No active disease. Electronically Signed   By: Helyn Numbers MD   On: 06/23/2020 22:55    Assessment: 79 year old man with a history of Parkinson's disease currently admitted for weakness/fatigue found to have bilateral hydronephrosis with AKI in setting of UTI.   -Foley catheter has been placed and will trend creatinine -If kidney function improves, recommend repeat imaging to ensure resolution of hydronephrosis in 3 to 4 days -If no change in kidney function or worsening kidney function may obtain CT scan to better evaluate anatomy/obstruction  Plan: Plan for CT scan to better evaluate anatomy/obstruction  Update : CT scan reviewed, radiology read pending. Patient with 1 cm mid ureteral calculus with moderate hydro on imaging.  Called wife to discuss findings and recommendation for ureteroscopy with laser lithotripsy and stent placement.  She is going to discuss with family and let me know. Possible plans for surgery Wed/Thurs.   Kasandra Knudsen, MD Urology 06/24/2020, 1:22 PM

## 2020-06-24 NOTE — Progress Notes (Signed)
Physical Therapy Treatment Patient Details Name: Randall [name] MRN: [id number] DOB: [birth year] Today's Date: 06/24/2020    History of Present Illness 79 yo admitted with acute renal failure due to poor PO intake x 5 days with AMS and weakness. PMhx: Parkinson's, DM, arthritis.    PT Comments    Patient limited this session by fatigue and orthostatic hypotension (see vitals below). Patient able to complete supine to sit with min-mod assist to manage LE's and raise trunk. Pt required assist to steady balance sitting EOB. He was able to complete sit<>stand transfer 2x. Pt reported some dizziness after first stand and returned to sit with some improvement. BP in sitting 113/68. On second stand pt BP dropped to 83/51 and pt was returned to supine and reported resolution of dizziness. Pt attempted supine exercises however was limited by fatigue. He will continue to benefit from skilled PT interventions. Will follow in acute setting and progress as able.  VITALS: Supine: 136/62 Sitting: 113/68 Standing: 83/51 Supine: 133/60   Follow Up Recommendations  Home health PT;Supervision for mobility/OOB     Equipment Recommendations  None recommended by PT    Recommendations for Other Services       Precautions / Restrictions Precautions Precautions: Fall Restrictions Weight Bearing Restrictions: No    Mobility  Bed Mobility Overal bed mobility: Needs Assistance Bed Mobility: Supine to Sit;Sit to Supine     Supine to sit: Mod assist;HOB elevated;Min assist Sit to supine: Min assist;Mod assist   General bed mobility comments: Min-Mod assist for LE's to move to EOB and min assist to facilitate reaching UE to bed rail, mod assist to raise trunk upright. +2 mod assist to return to supine due to hypotension.  Transfers Overall transfer level: Needs assistance Equipment used: Rolling walker (2 wheeled) Transfers: Sit to/from Stand Sit to Stand: From elevated surface;Min assist          General transfer comment: pt able to complete rise from EOB with min assist for power up and to steady in standing. pt with poserior lean upon rising. VC's to increse weight shift into great toes and pt's balance improved. Pt became lightheaded with standing and noted to be hypotensive. returned to supine in bed.  Ambulation/Gait        Stairs        Wheelchair Mobility    Modified Rankin (Stroke Patients Only)       Balance Overall balance assessment: Needs assistance Sitting-balance support: No upper extremity supported;Feet supported Sitting balance-Leahy Scale: Fair   Postural control: Posterior lean Standing balance support: Bilateral upper extremity supported Standing balance-Leahy Scale: Poor Standing balance comment: posteior lean in standing, reliant on UE support and external support to maintain balance.               Cognition Arousal/Alertness: Awake/alert Behavior During Therapy: WFL for tasks assessed/performed Overall Cognitive Status: Within Functional Limits for tasks assessed        General Comments: patient more alert and oriented today however fatigued.      Exercises Other Exercises Other Exercises: attempted repeated bridges in supine, pt unable to complete due to fatigue. 1 rep performed.     General Comments        Pertinent Vitals/Pain Pain Assessment: No/denies pain           PT Goals (current goals can now be found in the care plan section) Acute Rehab PT Goals PT Goal Formulation: With patient Time For Goal Achievement: 07/06/20 Potential to Achieve Goals:  Fair Progress towards PT goals: Progressing toward goals    Frequency    Min 3X/week      PT Plan Current plan remains appropriate       AM-PAC PT "6 Clicks" Mobility   Outcome Measure  Help needed turning from your back to your side while in a flat bed without using bedrails?: A Little Help needed moving from lying on your back to sitting on the side  of a flat bed without using bedrails?: A Little Help needed moving to and from a bed to a chair (including a wheelchair)?: A Little Help needed standing up from a chair using your arms (e.g., wheelchair or bedside chair)?: A Little Help needed to walk in hospital room?: A Lot Help needed climbing 3-5 steps with a railing? : A Lot 6 Click Score: 16    End of Session Equipment Utilized During Treatment: Gait belt Activity Tolerance: Patient limited by fatigue Patient left: in bed;with call bell/phone within reach;with bed alarm set;with family/visitor present Nurse Communication: Mobility status PT Visit Diagnosis: Other abnormalities of gait and mobility (R26.89);Difficulty in walking, not elsewhere classified (R26.2)     Time: 5638-7564 PT Time Calculation (min) (ACUTE ONLY): 29 min  Charges:  $Therapeutic Activity: 23-37 mins                     Wynn Maudlin, DPT Acute Rehabilitation Services  Office [phone] Pager [phone]  06/24/2020 1:12 PM

## 2020-06-24 NOTE — Progress Notes (Addendum)
PROGRESS NOTE    Randall [name]  JQZ:[id number] DOB: 06-Nov-1941 DOA: 06/21/2020 PCP: Gaspar Garbe, MD   Chief Complaint  Patient presents with  . Weakness    Brief Narrative: Randall [name] 79 y.o.malewith medical history significant ofparkinson's disease, DM2, arthritis who presents to the ED with concerns of increased weakness and poor PO intake.  Pt apparently in his usual state of health until 5 days prior to admit when he lost his appetite and was taking minimal PO.  He was losing strength and became unable to stand with assistance the day prior to admit.  Concern for aspiration events.  He was admitted with AKI and concern for UTI.  Renal US shows bilateral hydro and he's now s/p foley placement.  Wife had been looking into hospice prior to admission and palliative care was c/s and palliative care is following.  Renal CT pending at this time.  Dispo is pending improvement in renal function/overall status.       Assessment & Plan:   Principal Problem:   ARF (acute renal failure) (HCC) Active Problems:   Diabetes (HCC)   Parkinson disease (HCC)   S/P total knee arthroplasty   Hyperlipidemia   Benign fibroma of prostate   Acute lower UTI   DNR (do not resuscitate)   1. Goals of Care: appreciate assistance of palliative care and hospice.  He's had progressive renal failure since admission.  Initially plan to d/c with hospice support.  I think palliative care consult is reasonable, but with bilateral hydro in setting of aki, I think reasonable to see if he might have any improvement with foley catheter placement/discussion with urology prior to plan for discharge.  CT renal study pending today.  S/p foley placement.  Will follow renal CT and follow urology recommendations prior to plan for discharge with hospice, will see how he does over next few days with planned intervention to help with appropriate dispo.  Per his wife, he was walking with [name] walker, but incontinent  of urine at baseline, unable to dress himself, spent [name] lot of time in chair reading [place] times.  2. Acute Kidney Injury  Bilateral Hydronephrosis  Anion Gap Metabolic Acidosis 1. Creatinine gradually worsening - baseline 1.39 in 2020 2. Presented with creatinine 4.76, worsening again today  3. Renal US from 7/17 with mild R and moderate L hydronephrosis - s/p place indwelling foley catheter on 7/19 4. Follow CT renal stone protocol 7/20, appreciate urology assistance 5. Appreciate urology assistance - recommending CT scan 6.  Continue IVF with bicarb - AGMA 2/2 progressive renal failure  3. UTI 1. Pt with UA showing >50 wbc, many bacteria, large blood, large leuks with elevated WBC of 13.7 2. Urine cx with multiple species 3. Blood cx pending 4. Continue with ceftriaxone   4. Macrocytic Anemia  Iron Def  B12 Def: labs suggestive of B12 def and iron def 1. S/p 1 unit pRBC, inappropriate bump 2. Continue to monitor  5. Possible aspiration pneumonitis 1. Pt had witnessed aspiration event on AM of admit while trying to drink fluids 2. Last night (7/19) with episode of aspiration with pills, will continue to monitor for now - consider reeval by speech 3. CXR reviewed, clear 4. On rocephin for UTI above 5. SLP recommending regular diet, think liquids  6. DM2 1. Monitor glucose 2. On insulin prior to admit -may need to decrease or d/c this at discharge 3. SSI 4. A1c 6  7. Parkinson's 1. Seems  to be stable at this time 2. Cont home meds as tolerated  8. OA 1. Pt is s/p prior knee surgery 2. Will consult PT/OT - recommending home health  9. HLD 1. On statin prior to admit, will continue  10. Hx fibroma of prostate 1. Follows Urology at [facility], last seen on 7/12 2. Would cont home regimen  11. Hypocalcemia 1. Presenting calcium of 6.1, repeat 6.2 with alb 3.4 2. Suspect secondary to marked malnutrition 3. Likely hyperphos in setting of AKI 4. Start PO calcium     DVT prophylaxis: SCD Code Status: dnr Family Communication: wife at bedside Disposition:   Status is: Inpatient  Remains inpatient appropriate because:Inpatient level of care appropriate due to severity of illness   Dispo: The patient is from: Home              Anticipated d/c is to: Home              Anticipated d/c date is: > 3 days              Patient currently is not medically stable to d/c.   Consultants:   Palliative care  urology  Procedures:  none  Antimicrobials:  Anti-infectives (From admission, onward)   Start     Dose/Rate Route Frequency Ordered Stop   06/22/20 1800  cefTRIAXone (ROCEPHIN) 1 g in sodium chloride 0.9 % 100 mL IVPB     Discontinue     1 g 200 mL/hr over 30 Minutes Intravenous Every 24 hours 06/21/20 1724     06/21/20 1615  cefTRIAXone (ROCEPHIN) 1 g in sodium chloride 0.9 % 100 mL IVPB        1 g 200 mL/hr over 30 Minutes Intravenous  Once 06/21/20 1601 06/21/20 1851      Subjective: No complaints today  Objective: Vitals:   06/23/20 2313 06/24/20 0052 06/24/20 0250 06/24/20 0655  BP:  (!) 116/59 119/64 (!) 121/48  Pulse: 80 85 71 69  Resp: (!) 22 (!) 24 20 20   Temp:  98.7 F (37.1 C) 99.1 F (37.3 C) 99.1 F (37.3 C)  TempSrc:  Axillary  Oral  SpO2: 96% 94% 98% 98%  Weight:      Height:        Intake/Output Summary (Last 24 hours) at 06/24/2020 1025 Last data filed at 06/24/2020 0600 Gross per 24 hour  Intake 1832.47 ml  Output 2100 ml  Net -267.53 ml   Filed Weights   06/21/20 1940  Weight: 67.8 kg    Examination:  General: No acute distress. Cardiovascular: Heart sounds show Sivan Cuello regular rate, and rhythm.  Lungs: Clear to auscultation bilaterally Abdomen: Soft, nontender, nondistended  Neurological: Alert. Moves all extremities 4 with equal strength. Cranial nerves II through XII grossly intact. Skin: Warm and dry. No rashes or lesions. Extremities: No clubbing or cyanosis. No edema.   Data Reviewed: I have  personally reviewed following labs and imaging studies  CBC: Recent Labs  Lab 06/21/20 1312 06/22/20 0407 06/23/20 0426 06/24/20 0334  WBC 13.7* 14.4* 14.7* 12.5*  NEUTROABS 11.9*  --  12.8* 11.0*  HGB 8.2* 7.9* 7.2* 7.6*  HCT 24.6* 24.0* 22.5* 22.8*  MCV 101.7* 102.6* 105.6* 100.4*  PLT 167 164 158 160    Basic Metabolic Panel: Recent Labs  Lab 06/21/20 1312 06/22/20 0407 06/23/20 0421 06/23/20 0426 06/24/20 0334  NA 142 143  --  144 144  K 4.3 4.4  --  4.4 3.5  CL  117* 121*  --  121* 120*  CO2 10* 8*  --  7* 12*  GLUCOSE 102* 83  --  192* 207*  BUN 81* 82*  --  84* 87*  CREATININE 4.76* 4.83*  --  5.05* 5.11*  CALCIUM 6.1* 6.2*  --  6.6* 6.2*  MG  --   --   --   --  1.5*  PHOS  --   --  4.6  --  5.0*    GFR: Estimated Creatinine Clearance: 11.2 mL/min ([place]) (by C-G formula based on SCr of 5.11 mg/dL (H)).  Liver Function Tests: Recent Labs  Lab 06/21/20 1312 06/22/20 0407 06/24/20 0334  AST 14* 11* 10*  ALT 8 7 5   ALKPHOS 179* 160* 122  BILITOT 0.2* 0.4 0.7  PROT 6.6 6.1* 5.6*  ALBUMIN 3.6 3.4* 2.8*    CBG: Recent Labs  Lab 06/23/20 0737 06/23/20 1159 06/23/20 1731 06/23/20 2140 06/24/20 0711  GLUCAP 162* 180* 208* 176* 170*     Recent Results (from the past 240 hour(s))  SARS Coronavirus 2 by RT PCR (hospital order, performed in [facility] hospital lab) Nasopharyngeal Nasopharyngeal Swab     Status: None   Collection Time: 06/21/20  3:20 PM   Specimen: Nasopharyngeal Swab  Result Value Ref Range Status   SARS Coronavirus 2 NEGATIVE NEGATIVE Final    Comment: (NOTE) SARS-CoV-2 target nucleic acids are NOT DETECTED.  The SARS-CoV-2 RNA is generally detectable in upper and lower respiratory specimens during the acute phase of infection. The lowest concentration of SARS-CoV-2 viral copies this assay can detect is 250 copies / mL. Berkleigh Beckles negative result does not preclude SARS-CoV-2 infection and should not be used as the sole basis for treatment  or other patient management decisions.  Gershom Brobeck negative result may occur with improper specimen collection / handling, submission of specimen other than nasopharyngeal swab, presence of viral mutation(s) within the areas targeted by this assay, and inadequate number of viral copies (<250 copies / mL). Merrel Crabbe negative result must be combined with clinical observations, patient history, and epidemiological information.  Fact Sheet for Patients:   06/23/20  Fact Sheet for Healthcare Providers: BoilerBrush.com.cy  This test is not yet approved or  cleared by the https://pope.com/ FDA and has been authorized for detection and/or diagnosis of SARS-CoV-2 by FDA under an Emergency Use Authorization (EUA).  This EUA will remain in effect (meaning this test can be used) for the duration of the COVID-19 declaration under Section 564(b)(1) of the Act, 21 U.S.C. section 360bbb-3(b)(1), unless the authorization is terminated or revoked sooner.  Performed at [facility], [address], [place], [place]   Urine Culture     Status: Abnormal   Collection Time: 06/21/20  4:01 PM   Specimen: Urine, Random  Result Value Ref Range Status   Specimen Description   Final    URINE, RANDOM Performed at [facility], [address] [place], [place]    Special Requests   Final    NONE Performed at [facility], [address], [place], [place]    Culture MULTIPLE SPECIES PRESENT, SUGGEST RECOLLECTION (Kelsye Loomer)  Final   Report Status 06/22/2020 FINAL  Final  Culture, blood (routine x 2)     Status: None (Preliminary result)   Collection Time: 06/22/20  1:12 PM   Specimen: BLOOD  Result Value Ref Range Status   Specimen Description   Final    BLOOD LEFT  HAND Performed at [facility], [address] [place], [place] [address]    Special Requests   Final     BOTTLES DRAWN AEROBIC ONLY Blood Culture adequate volume Performed at [facility], [address] [place], [place] [address]    Culture   Final    NO GROWTH 2 DAYS Performed at [facility], [address] [place], [place] [address]    Report Status PENDING  Incomplete  Culture, blood (routine x 2)     Status: None (Preliminary result)   Collection Time: 06/22/20  1:16 PM   Specimen: BLOOD  Result Value Ref Range Status   Specimen Description   Final    BLOOD LEFT ANTECUBITAL Performed at [facility], [address] [place], [place] [address]    Special Requests   Final    BOTTLES DRAWN AEROBIC AND ANAEROBIC Blood Culture adequate volume Performed at [facility], [address] [place], [place] [address]    Culture   Final    NO GROWTH 2 DAYS Performed at [facility], [address] [place], [place] [address]    Report Status PENDING  Incomplete         Radiology Studies: DG CHEST PORT 1 VIEW  Result Date: 06/23/2020 CLINICAL DATA:  Aspiration EXAM: PORTABLE CHEST 1 VIEW COMPARISON:  06/21/2020 FINDINGS: Lungs are clear. No pneumothorax or pleural effusion. Cardiac size within normal limits. Pulmonary vascularity is normal. Neurostimulator battery pack overlies the right hemithorax with the electrode extending superiorly, unchanged. IMPRESSION: No active disease. Electronically Signed   By: Helyn NumbersAshesh  Parikh MD   On: 06/23/2020 22:55        Scheduled Meds: . sodium chloride   Intravenous Once  . calcium carbonate  400 mg of elemental calcium Oral TID  . Carbidopa-Levodopa ER  1.5 tablet Oral TID  . Chlorhexidine Gluconate Cloth  6 each Topical Daily  . donepezil  5 mg Oral QHS  . ferrous sulfate  325 mg Oral Q breakfast  . insulin aspart  0-5 Units Subcutaneous QHS  . insulin aspart  0-9 Units Subcutaneous TID WC  . lipase/protease/amylase  36,000 Units Oral TID with meals  . magnesium oxide   400 mg Oral BID  . rOPINIRole  2 mg Oral QHS  . venlafaxine XR  225 mg Oral Q breakfast  . vitamin B-12  1,000 mcg Oral Daily   Continuous Infusions: . cefTRIAXone (ROCEPHIN)  IV Stopped (06/23/20 1701)  .  sodium bicarbonate (isotonic) infusion in sterile water 100 mL/hr at 06/23/20 1629     LOS: 3 days    Time spent: over 30 min    Lacretia Nicksaldwell Powell, MD [facility]   To contact the attending provider between 7A-7P or the covering provider during after hours 7P-7A, please log into the web site [email or web address] and access using universal [place] password for that web site. If you do not have the password, please call the hospital operator.  06/24/2020, 10:25 AM

## 2020-06-25 ENCOUNTER — Inpatient Hospital Stay (HOSPITAL_COMMUNITY): Payer: Medicare Other | Admitting: Certified Registered Nurse Anesthetist

## 2020-06-25 ENCOUNTER — Inpatient Hospital Stay (HOSPITAL_COMMUNITY): Payer: Medicare Other

## 2020-06-25 ENCOUNTER — Encounter (HOSPITAL_COMMUNITY): Payer: Self-pay | Admitting: Internal Medicine

## 2020-06-25 ENCOUNTER — Encounter (HOSPITAL_COMMUNITY)
Admission: EM | Disposition: A | Discharge status: Hospice | Payer: Self-pay | Source: Home / Self Care | Attending: Internal Medicine

## 2020-06-25 DIAGNOSIS — N132 Hydronephrosis with renal and ureteral calculous obstruction: Secondary | ICD-10-CM

## 2020-06-25 DIAGNOSIS — E876 Hypokalemia: Secondary | ICD-10-CM

## 2020-06-25 DIAGNOSIS — E119 Type 2 diabetes mellitus without complications: Secondary | ICD-10-CM

## 2020-06-25 DIAGNOSIS — Z794 Long term (current) use of insulin: Secondary | ICD-10-CM

## 2020-06-25 DIAGNOSIS — E872 Acidosis: Secondary | ICD-10-CM

## 2020-06-25 DIAGNOSIS — Z96659 Presence of unspecified artificial knee joint: Secondary | ICD-10-CM

## 2020-06-25 DIAGNOSIS — E8729 Other acidosis: Secondary | ICD-10-CM | POA: Diagnosis present

## 2020-06-25 DIAGNOSIS — Z515 Encounter for palliative care: Secondary | ICD-10-CM

## 2020-06-25 HISTORY — PX: CYSTOSCOPY W/ URETERAL STENT PLACEMENT: SHX1429

## 2020-06-25 LAB — CBC WITH DIFFERENTIAL/PLATELET
Abs Immature Granulocytes: 0.17 10*3/uL — ABNORMAL HIGH (ref 0.00–0.07)
Basophils Absolute: 0 10*3/uL (ref 0.0–0.1)
Basophils Relative: 0 %
Eosinophils Absolute: 0 10*3/uL (ref 0.0–0.5)
Eosinophils Relative: 0 %
HCT: 23.2 % — ABNORMAL LOW (ref 39.0–52.0)
Hemoglobin: 8 g/dL — ABNORMAL LOW (ref 13.0–17.0)
Immature Granulocytes: 1 %
Lymphocytes Relative: 5 %
Lymphs Abs: 0.8 10*3/uL (ref 0.7–4.0)
MCH: 33.3 pg (ref 26.0–34.0)
MCHC: 34.5 g/dL (ref 30.0–36.0)
MCV: 96.7 fL (ref 80.0–100.0)
Monocytes Absolute: 1.1 10*3/uL — ABNORMAL HIGH (ref 0.1–1.0)
Monocytes Relative: 7 %
Neutro Abs: 13.8 10*3/uL — ABNORMAL HIGH (ref 1.7–7.7)
Neutrophils Relative %: 87 %
Platelets: 189 10*3/uL (ref 150–400)
RBC: 2.4 MIL/uL — ABNORMAL LOW (ref 4.22–5.81)
RDW: 14.1 % (ref 11.5–15.5)
WBC: 16 10*3/uL — ABNORMAL HIGH (ref 4.0–10.5)
nRBC: 0 % (ref 0.0–0.2)

## 2020-06-25 LAB — COMPREHENSIVE METABOLIC PANEL
ALT: 10 U/L (ref 0–44)
AST: 22 U/L (ref 15–41)
Albumin: 2.9 g/dL — ABNORMAL LOW (ref 3.5–5.0)
Alkaline Phosphatase: 138 U/L — ABNORMAL HIGH (ref 38–126)
Anion gap: 18 — ABNORMAL HIGH (ref 5–15)
BUN: 93 mg/dL — ABNORMAL HIGH (ref 8–23)
CO2: 19 mmol/L — ABNORMAL LOW (ref 22–32)
Calcium: 5.9 mg/dL — CL (ref 8.9–10.3)
Chloride: 108 mmol/L (ref 98–111)
Creatinine, Ser: 4.84 mg/dL — ABNORMAL HIGH (ref 0.61–1.24)
GFR calc Af Amer: 12 mL/min — ABNORMAL LOW (ref >60)
GFR calc non Af Amer: 11 mL/min — ABNORMAL LOW (ref >60)
Glucose, Bld: 296 mg/dL — ABNORMAL HIGH (ref 70–99)
Potassium: 3 mmol/L — ABNORMAL LOW (ref 3.5–5.1)
Sodium: 145 mmol/L (ref 135–145)
Total Bilirubin: 0.7 mg/dL (ref 0.3–1.2)
Total Protein: 6 g/dL — ABNORMAL LOW (ref 6.5–8.1)

## 2020-06-25 LAB — GLUCOSE, CAPILLARY
Glucose-Capillary: 301 mg/dL — ABNORMAL HIGH (ref 70–99)
Glucose-Capillary: 360 mg/dL — ABNORMAL HIGH (ref 70–99)
Glucose-Capillary: 311 mg/dL — ABNORMAL HIGH (ref 70–99)
Glucose-Capillary: 186 mg/dL — ABNORMAL HIGH (ref 70–99)
Glucose-Capillary: 173 mg/dL — ABNORMAL HIGH (ref 70–99)

## 2020-06-25 LAB — PHOSPHORUS: Phosphorus: 3.1 mg/dL (ref 2.5–4.6)

## 2020-06-25 LAB — VITAMIN B12: Vitamin B-12: 503 pg/mL (ref 180–914)

## 2020-06-25 LAB — MAGNESIUM: Magnesium: 1.6 mg/dL — ABNORMAL LOW (ref 1.7–2.4)

## 2020-06-25 SURGERY — CYSTOSCOPY, WITH RETROGRADE PYELOGRAM AND URETERAL STENT INSERTION
Anesthesia: General | Laterality: Left

## 2020-06-25 MED ORDER — ONDANSETRON HCL 4 MG/2ML IJ SOLN
4.0000 mg | Freq: Four times a day (QID) | INTRAMUSCULAR | Status: DC | PRN
  Administered 2020-06-25: 4 mg via INTRAVENOUS
  Filled 2020-06-25: qty 2

## 2020-06-25 MED ORDER — PHENYLEPHRINE 40 MCG/ML (10ML) SYRINGE FOR IV PUSH (FOR BLOOD PRESSURE SUPPORT)
PREFILLED_SYRINGE | INTRAVENOUS | Status: AC
  Filled 2020-06-25: qty 10

## 2020-06-25 MED ORDER — STARCH (THICKENING) PO POWD
ORAL | Status: DC | PRN
  Filled 2020-06-25: qty 227

## 2020-06-25 MED ORDER — IOHEXOL 300 MG/ML  SOLN
INTRAMUSCULAR | Status: DC | PRN
  Administered 2020-06-25: 7 mL

## 2020-06-25 MED ORDER — POTASSIUM CHLORIDE CRYS ER 20 MEQ PO TBCR
40.0000 meq | EXTENDED_RELEASE_TABLET | ORAL | Status: AC
  Administered 2020-06-25: 40 meq via ORAL
  Filled 2020-06-25: qty 2

## 2020-06-25 MED ORDER — INSULIN GLARGINE 100 UNIT/ML ~~LOC~~ SOLN
5.0000 [IU] | Freq: Every day | SUBCUTANEOUS | Status: DC
  Administered 2020-06-25 – 2020-06-26 (×2): 5 [IU] via SUBCUTANEOUS
  Filled 2020-06-25 (×2): qty 0.05

## 2020-06-25 MED ORDER — STERILE WATER FOR INJECTION IV SOLN
INTRAVENOUS | Status: DC
  Filled 2020-06-25: qty 850

## 2020-06-25 MED ORDER — LACTATED RINGERS IV SOLN: INTRAVENOUS | Status: DC

## 2020-06-25 MED ORDER — BISACODYL 10 MG RE SUPP
10.0000 mg | Freq: Every day | RECTAL | Status: DC
  Filled 2020-06-25 (×3): qty 1

## 2020-06-25 MED ORDER — SODIUM CHLORIDE 0.9 % IV SOLN
3.0000 g | INTRAVENOUS | Status: DC
  Administered 2020-06-25: 3 g via INTRAVENOUS
  Filled 2020-06-25: qty 3

## 2020-06-25 MED ORDER — PROPOFOL 10 MG/ML IV BOLUS
INTRAVENOUS | Status: AC
  Filled 2020-06-25: qty 20

## 2020-06-25 MED ORDER — PHENYLEPHRINE 40 MCG/ML (10ML) SYRINGE FOR IV PUSH (FOR BLOOD PRESSURE SUPPORT)
PREFILLED_SYRINGE | INTRAVENOUS | Status: DC | PRN
  Administered 2020-06-25 (×2): 80 ug via INTRAVENOUS

## 2020-06-25 MED ORDER — SENNOSIDES-DOCUSATE SODIUM 8.6-50 MG PO TABS
1.0000 | ORAL_TABLET | Freq: Two times a day (BID) | ORAL | Status: DC
  Administered 2020-06-28: 1 via ORAL
  Filled 2020-06-25 (×5): qty 1

## 2020-06-25 MED ORDER — SODIUM CHLORIDE 0.9 % IV SOLN: 3.0000 g | Freq: Four times a day (QID) | INTRAVENOUS | Status: DC

## 2020-06-25 MED ORDER — INSULIN ASPART 100 UNIT/ML ~~LOC~~ SOLN
0.0000 [IU] | Freq: Four times a day (QID) | SUBCUTANEOUS | Status: DC
  Administered 2020-06-25: 9 [IU] via SUBCUTANEOUS
  Administered 2020-06-25 – 2020-06-26 (×2): 2 [IU] via SUBCUTANEOUS
  Administered 2020-06-26 (×3): 7 [IU] via SUBCUTANEOUS
  Administered 2020-06-27: 2 [IU] via SUBCUTANEOUS
  Administered 2020-06-27: 5 [IU] via SUBCUTANEOUS

## 2020-06-25 MED ORDER — FENTANYL CITRATE (PF) 100 MCG/2ML IJ SOLN
INTRAMUSCULAR | Status: AC
  Filled 2020-06-25: qty 2

## 2020-06-25 MED ORDER — ONDANSETRON HCL 4 MG/2ML IJ SOLN
INTRAMUSCULAR | Status: AC
  Filled 2020-06-25: qty 2

## 2020-06-25 MED ORDER — SODIUM CHLORIDE 0.9 % IV SOLN: INTRAVENOUS | Status: DC

## 2020-06-25 MED ORDER — MAGNESIUM SULFATE 4 GM/100ML IV SOLN
4.0000 g | Freq: Once | INTRAVENOUS | Status: AC
  Administered 2020-06-25: 4 g via INTRAVENOUS
  Filled 2020-06-25: qty 100

## 2020-06-25 MED ORDER — POLYETHYLENE GLYCOL 3350 17 G PO PACK
17.0000 g | PACK | Freq: Every day | ORAL | Status: DC
  Filled 2020-06-25 (×3): qty 1

## 2020-06-25 MED ORDER — ONDANSETRON HCL 4 MG/2ML IJ SOLN
INTRAMUSCULAR | Status: DC | PRN
  Administered 2020-06-25: 4 mg via INTRAVENOUS

## 2020-06-25 MED ORDER — FENTANYL CITRATE (PF) 100 MCG/2ML IJ SOLN
INTRAMUSCULAR | Status: DC | PRN
  Administered 2020-06-25: 25 ug via INTRAVENOUS
  Administered 2020-06-25: 50 ug via INTRAVENOUS
  Administered 2020-06-25: 25 ug via INTRAVENOUS

## 2020-06-25 MED ORDER — METRONIDAZOLE IN NACL 5-0.79 MG/ML-% IV SOLN
500.0000 mg | Freq: Three times a day (TID) | INTRAVENOUS | Status: DC
  Administered 2020-06-25 – 2020-06-28 (×8): 500 mg via INTRAVENOUS
  Filled 2020-06-25 (×8): qty 100

## 2020-06-25 MED ORDER — LIDOCAINE 2% (20 MG/ML) 5 ML SYRINGE
INTRAMUSCULAR | Status: AC
  Filled 2020-06-25: qty 5

## 2020-06-25 MED ORDER — CALCIUM GLUCONATE-NACL 1-0.675 GM/50ML-% IV SOLN
1.0000 g | Freq: Once | INTRAVENOUS | Status: AC
  Administered 2020-06-25: 1000 mg via INTRAVENOUS
  Filled 2020-06-25: qty 50

## 2020-06-25 MED ORDER — SODIUM CHLORIDE 0.9 % IR SOLN
Status: DC | PRN
  Administered 2020-06-25: 3000 mL via INTRAVESICAL

## 2020-06-25 MED ORDER — SODIUM CHLORIDE 0.9 % IV SOLN
2.0000 g | INTRAVENOUS | Status: DC
  Administered 2020-06-25 – 2020-06-28 (×4): 2 g via INTRAVENOUS
  Filled 2020-06-25 (×4): qty 2
  Filled 2020-06-25: qty 20

## 2020-06-25 SURGICAL SUPPLY — 16 items
BAG URO CATCHER STRL LF (MISCELLANEOUS) ×2 IMPLANT
BASKET ZERO TIP NITINOL 2.4FR (BASKET) ×1 IMPLANT
CATH INTERMIT  6FR 70CM (CATHETERS) ×2 IMPLANT
CLOTH BEACON ORANGE TIMEOUT ST (SAFETY) ×2 IMPLANT
GLOVE BIO SURGEON STRL SZ 6.5 (GLOVE) ×2 IMPLANT
GOWN STRL REUS W/ TWL LRG LVL3 (GOWN DISPOSABLE) ×1 IMPLANT
GOWN STRL REUS W/TWL LRG LVL3 (GOWN DISPOSABLE) ×6 IMPLANT
GUIDEWIRE ANG ZIPWIRE 038X150 (WIRE) ×1 IMPLANT
GUIDEWIRE STR DUAL SENSOR (WIRE) ×2 IMPLANT
KIT TURNOVER KIT A (KITS) IMPLANT
LASER FIBER FLEXIV TRACTIP 200 (Laser) ×1 IMPLANT
MANIFOLD NEPTUNE II (INSTRUMENTS) ×2 IMPLANT
PACK CYSTO (CUSTOM PROCEDURE TRAY) ×2 IMPLANT
STENT URET 6FRX26 CONTOUR (STENTS) ×1 IMPLANT
TUBING CONNECTING 10 (TUBING) ×2 IMPLANT
TUBING UROLOGY SET (TUBING) IMPLANT

## 2020-06-25 NOTE — Progress Notes (Signed)
   Daily Progress Note   Patient Name: Randall [name]       Date: 06/25/2020 DOB: January 17, 1941  Age: 79 y.o. MRN#: [id number] Attending Physician: Rodolph Bong, MD Primary Care Physician: Gaspar Garbe, MD Admit Date: 06/21/2020  Reason for Consultation/Follow-up: Establishing goals of care  Subjective: Patient lying in bed. Awake, alert and oriented. Denies pain or shortness of breath. Foley catheter in place. Wife at the bedside with cold wet towel in his mouth due to being NPO for upcoming lithotripsy. Swabs provided with education on use. She verbalized understanding and appreciation.   Patient scheduled for lithotripsy with possible stent placement due to known calculus and hopes of some renal improvement. Wife confirms previous goals of care. Goals remain clear at this time to continue to treat the treatable. Wife confirms plan remains home with hospice, who she has been in contact with regarding discharging plans.   All questions answered and support provided.   Physical Exam: -awake, alert and oriented -normal breathing pattern -follows commands, mood appropriate            Palliative Care Assessment & Plan    Code Status:  DNR  Goals of Care/Recommendations:  Continue to treat the treatable  Watchful waiting. Hopeful for some renal improvement post procedure and with catheter placement.   Outpatient hospice vs. Palliative at discharge  PMT will continue to support as needed. Please call team line with needs.   Prognosis: Unable to determine  Discharge Planning: To Be Determined  Thank you for allowing the Palliative Medicine Team to assist in the care of this patient.  Time Total: 25 min.   Visit consisted of counseling and education dealing with the complex and emotionally intense issues of symptom management and palliative care in the setting of serious and potentially life-threatening illness.Greater than 50%  of this time was spent counseling and  coordinating care related to the above assessment and plan.  Willette Alma, AGPCNP-BC  Palliative Medicine Team [phone]

## 2020-06-25 NOTE — Progress Notes (Signed)
OT Cancellation Note  Patient Details Name: Randall [name] MRN: [id number] DOB: 1941/07/01   Cancelled Treatment:    Reason Eval/Treat Not Completed: Other (comment) Patient's spouse declined therapy today on patient's behalf. Reports he is having surgery today and needs to rest and remain unstimulated. Will f/u tomorrow or as able.  Luddie Boghosian L Kiaan Overholser 06/25/2020, 8:49 AM

## 2020-06-25 NOTE — Anesthesia Postprocedure Evaluation (Signed)
Anesthesia Post Note  Patient: Randall [name]  Procedure(s) Performed: CYSTOSCOPY WITH LEFT RETROGRADE PYELOGRAM URETEROSCOPY WITH LASER AND LEFT URETERAL STENT PLACEMENT (Left )     Patient location during evaluation: PACU Anesthesia Type: General Level of consciousness: awake and alert Pain management: pain level controlled Vital Signs Assessment: post-procedure vital signs reviewed and stable Respiratory status: spontaneous breathing, nonlabored ventilation and respiratory function stable Cardiovascular status: blood pressure returned to baseline and stable Postop Assessment: no apparent nausea or vomiting Anesthetic complications: no   No complications documented.  Last Vitals:  Vitals:   06/25/20 1600 06/25/20 1615  BP: 122/67 134/74  Pulse: 90 87  Resp: 13 12  Temp:    SpO2: 98% 99%    Last Pain:  Vitals:   06/25/20 1615  TempSrc:   PainSc: 0-No pain                 [name]

## 2020-06-25 NOTE — Interval H&P Note (Signed)
History and Physical Interval Note: Patient found to have 1 cm mid left ureteral stone on CT scan yesterday.  Discussed proceeding with below surgery with patient's wife.   06/25/2020 10:30 AM  Randall [name]  has presented today for surgery, with the diagnosis of left ureteral obstruction.  The various methods of treatment have been discussed with the patient and family. After consideration of risks, benefits and other options for treatment, the patient has consented to  Procedure(s): CYSTOSCOPY WITH LEFT RETROGRADE PYELOGRAM URETEROSCOPY WITH LASER AND LEFT URETERAL STENT PLACEMENT (Left) as a surgical intervention.  The patient's history has been reviewed, patient examined, no change in status, stable for surgery.  I have reviewed the patient's chart and labs.  Questions were answered to the patient's satisfaction.     Randall [name] D [place] Hijazi

## 2020-06-25 NOTE — Progress Notes (Signed)
CRITICAL VALUE ALERT  Critical Value:  Calcium 5.9  Date & Time Notied:  06/25/20 7673  Provider Notified: M. Katherina Right  Orders Received/Actions taken: awaiting orders

## 2020-06-25 NOTE — Progress Notes (Signed)
Pharmacy Antibiotic Note  Randall [name] is a 79 y.o. male admitted on 06/21/2020 with aspiration PNA .  Pharmacy has been consulted for Unasyn dosing.  Plan: Unasyn 3 gr IV q24h   Monitor clinical course, renal function, cultures as available   Height: 5\' 11"  (180.3 cm) Weight: 67.8 kg (149 lb 7.6 oz) IBW/kg (Calculated) : 75.3  Temp (24hrs), Avg:98.4 F (36.9 C), Min:97.7 F (36.5 C), Max:98.9 F (37.2 C)  Recent Labs  Lab 06/21/20 1312 06/22/20 0407 06/23/20 0426 06/24/20 0334 06/25/20 0530  WBC 13.7* 14.4* 14.7* 12.5* 16.0*  CREATININE 4.76* 4.83* 5.05* 5.11* 4.84*    Estimated Creatinine Clearance: 11.9 mL/min (A) (by C-G formula based on SCr of 4.84 mg/dL (H)).    Allergies  Allergen Reactions  . Iodine Nausea And Vomiting    CT trace   . Shellfish Allergy Nausea And Vomiting    Antimicrobials this admission: Ceftriaxone  7/17>> 7/21 Unasyn 7/21 >>   Dose adjustments this admission:    Microbiology results: 7/18 BCx: NGTD 7/17 UCx: multiple species present, suggest recollection  Thank you for allowing pharmacy to be a part of this patient's care.   8/17, PharmD, BCPS 06/25/2020 10:21 AM

## 2020-06-25 NOTE — Transfer of Care (Signed)
Immediate Anesthesia Transfer of Care Note  Patient: Randall [name]  Procedure(s) Performed: Procedure(s): CYSTOSCOPY WITH LEFT RETROGRADE PYELOGRAM URETEROSCOPY WITH LASER AND LEFT URETERAL STENT PLACEMENT (Left)  Patient Location: PACU  Anesthesia Type:General  Level of Consciousness: Alert, Awake, Oriented  Airway & Oxygen Therapy: Patient Spontanous Breathing  Post-op Assessment: Report given to RN  Post vital signs: Reviewed and stable  Last Vitals:  Vitals:   06/25/20 0621 06/25/20 1331  BP: (!) 136/59 133/70  Pulse: 76 85  Resp: 17 (!) 21  Temp: 37.2 C 36.7 C  SpO2: 94% 98%    Complications: No apparent anesthesia complications

## 2020-06-25 NOTE — Anesthesia Preprocedure Evaluation (Addendum)
Anesthesia Evaluation  Patient identified by MRN, date of birth, ID band Patient awake    Reviewed: Allergy & Precautions, NPO status , Patient's Chart, lab work & pertinent test results  Airway Mallampati: II  TM Distance: >3 FB Neck ROM: Full    Dental   Pulmonary neg pulmonary ROS,    Pulmonary exam normal breath sounds clear to auscultation       Cardiovascular negative cardio ROS Normal cardiovascular exam Rhythm:Regular Rate:Normal     Neuro/Psych Anxiety Parkinson's disease  Neuromuscular disease    GI/Hepatic GERD  Medicated and Controlled,  Endo/Other  diabetes, Well Controlled, Type 2, Insulin DependentHyperlipidemia  Renal/GU Renal diseaseRight ureteral calculus    Incontinent of uring    Musculoskeletal  (+) Arthritis , Osteoarthritis,    Abdominal   Peds  Hematology   Anesthesia Other Findings   Reproductive/Obstetrics                             Anesthesia Physical Anesthesia Plan  ASA: III  Anesthesia Plan: General   Post-op Pain Management:    Induction: Intravenous  PONV Risk Score and Plan: 3 and Ondansetron and Treatment may vary due to age or medical condition  Airway Management Planned: LMA  Additional Equipment:   Intra-op Plan:   Post-operative Plan: Extubation in OR  Informed Consent: I have reviewed the patients History and Physical, chart, labs and discussed the procedure including the risks, benefits and alternatives for the proposed anesthesia with the patient or authorized representative who has indicated his/her understanding and acceptance.    Discussed DNR with patient and Suspend DNR.   Dental advisory given  Plan Discussed with: CRNA and Anesthesiologist  Anesthesia Plan Comments:        Anesthesia Quick Evaluation

## 2020-06-25 NOTE — Op Note (Signed)
Preoperative diagnosis: left ureteral calculus  Postoperative diagnosis: left ureteral calculus  Procedure:  1. Cystoscopy 2. left ureteroscopy and stone removal 3. Ureteroscopic laser lithotripsy 4. left 49F x 26 ureteral stent placement  5. left retrograde pyelography with interpretation  Surgeon: Kasandra Knudsen, MD  Anesthesia: General  Complications: None  Intraoperative findings: left retrograde pyelography demonstrated a filling defect within the left ureter consistent with the patient's known calculus without other abnormalities.  EBL: Minimal  Specimens: 1. left ureteral calculus  Disposition of specimens: [facility] for stone analysis  Indication: Randall [name] is a 79 y.o.   patient with a 1 cm left ureteral stone and associated AKI and weakness. After reviewing the management options for treatment, the patient elected to proceed with the above surgical procedure(s). We have discussed the potential benefits and risks of the procedure, side effects of the proposed treatment, the likelihood of the patient achieving the goals of the procedure, and any potential problems that might occur during the procedure or recuperation. Informed consent has been obtained.   Description of procedure:  The patient was taken to the operating room and general anesthesia was induced.  The patient was placed in the dorsal lithotomy position, prepped and draped in the usual sterile fashion, and preoperative antibiotics were administered. A preoperative time-out was performed.   Cystourethroscopy was performed.  The patient's urethra was examined and demonstrated bilobar prostatic hypertrophy.  The bladder was then systematically examined in its entirety. There was no evidence for any bladder tumors, stones, or other mucosal pathology.  He did have severe trabeculations.   Attention then turned to the left ureteral orifice and a ureteral catheter was used to intubate the  ureteral orifice.  Omnipaque contrast was injected through the ureteral catheter and a retrograde pyelogram was performed with findings as dictated above.  A 0.38 sensor guidewire was then advanced up the left ureter into the renal pelvis under fluoroscopic guidance. The 4.5 Fr semirigid ureteroscope was then advanced into the ureter next to the guidewire and the calculus was identified.   The stone was then fragmented with the 200 micron holmium laser fiber.  The stone was noted to be impacted and visualization was poor.  Some fragments were removed with basket however it was difficult to see proximal to area of stone.  The decision place ureteral stent and reimage in 2 weeks.    The wire was then backloaded through the cystoscope and a ureteral stent was advance over the wire using Seldinger technique.  The stent was positioned appropriately under fluoroscopic and cystoscopic guidance.  The wire was then removed with an adequate stent curl noted in the renal pelvis as well as in the bladder.  The bladder was then emptied and the procedure ended.  The patient appeared to tolerate the procedure well and without complications.  The patient was able to be awakened and transferred to the recovery unit in satisfactory condition.   Disposition: The tether of the stent was removed.  Foley catheter was replaced.   He will be transferred back to the floor.

## 2020-06-25 NOTE — Progress Notes (Signed)
Patent examiner [facility]) Hospital Liaison: RN note     Notified by Transition of Care Manger Vivi Barrack, CSW of patient/family request for [facility] services at home after discharge. Chart and patient information reviewed by [facility] physician. Hospice eligibility confirmed.     Follow up call to wife today. Anticipated discharge in a few days.   DME ordered and wife has been contacted to set up delivery.   Please send signed and completed DNR form home with patient/family. Patient will need prescriptions for discharge comfort medications.     [facility] Referral [facility] aware of the above. Please notify [facility] when patient is ready to leave the unit at discharge. (Call [phone] or [phone] after 5pm.) [facility] information and contact numbers given to [place].       Please call with any hospice related questions.  [facility] hospital liaisons will continue to follow until discharge to assist in coordination of hospice services.      Elsie Saas, RN, [facility] (listed on [facility] under Hospice [facility])   [phone]

## 2020-06-25 NOTE — Progress Notes (Signed)
Inpatient Diabetes Program Recommendations  AACE/ADA: New Consensus Statement on Inpatient Glycemic Control (2015)  Target Ranges:  Prepandial:   less than 140 mg/dL      Peak postprandial:   less than 180 mg/dL (1-2 hours)      Critically ill patients:  140 - 180 mg/dL   Lab Results  Component Value Date   GLUCAP 360 (H) 06/25/2020   HGBA1C 6.0 (H) 06/22/2020    Review of Glycemic Control  Diabetes history: DM2 Outpatient Diabetes medications: Lantus 20 in am and 10 units QHS, Humalog 5 units tid if blood sugar > 150 mg/dL Current orders for Inpatient glycemic control: Lantus 5 units QD, Novolog 0-9 units Q6H.  HgbA1C - 6.0% Blood sugars 301, 360 this am. Had 12 units of Novolog on 7/20. Has had poor po intake. Surgery today.  Inpatient Diabetes Program Recommendations:     Increase Lantus to 5 units BID.  Will follow.  Thank you. Ailene Ards, RD, LDN, CDE Inpatient Diabetes Coordinator [phone]

## 2020-06-25 NOTE — Progress Notes (Signed)
PROGRESS NOTE    Randall [name]  UKG:[id number] DOB: July 07, 1941 DOA: 06/21/2020 PCP: Gaspar Garbe, MD    Chief Complaint  Patient presents with  . Weakness    Brief Narrative:  Randall [name] a 79 y.o.malewith medical history significant ofparkinson's disease, DM2, arthritis who presented to the ED with concerns of increased weakness and poor PO intake.  Pt apparently in his usual state of health until 5 days prior to admit when he lost his appetite and was taking minimal PO.  He was losing strength and became unable to stand with assistance the day prior to admit.  Concern for aspiration events.  He was admitted with AKI and concern for UTI.  Renal US shows bilateral hydro and he's now s/p foley placement.  Wife had been looking into hospice prior to admission and palliative care was c/s and palliative care is following.  Renal CT with moderate left hydronephrosis with perinephric stranding secondary to 1 cm proximal left ureteral calculus, large amount of stool seen in the sigmoid colon and rectum.  Patient has been assessed by urology and patient to undergo cystoscopy with left retrograde pyelogram ureteroscopy with laser and left ureteral stent placement today 06/25/2020. Dispo is pending improvement in renal function/overall status.         Assessment & Plan:   Principal Problem:   ARF (acute renal failure) (HCC) Active Problems:   Diabetes (HCC)   Parkinson disease (HCC)   S/P total knee arthroplasty   Hyperlipidemia   Benign fibroma of prostate   Acute lower UTI   DNR (do not resuscitate)   Hypokalemia   Hyperphosphatemia   Metabolic acidosis, increased anion gap   Hypomagnesemia   Palliative care by specialist   Hydronephrosis with renal and ureteral calculus obstruction   Hypocalcemia  #1 acute renal failure/bilateral hydronephrosis/anion gap metabolic acidosis Patient had presented with increased weakness, decreased oral intake noted to be in acute renal  failure with a creatinine on admission of 4.76 which went up as high as 5.11.  Last creatinine noted on epic was 0.62 on 03/10/2014.  Baseline creatinine per Dr. Lowell Guitar was 1.39 in 2020.  Renal ultrasound on 06/21/2020 with mild right and moderate left hydronephrosis status post Foley catheter placement.  Follow-up CT renal stone protocol 06/24/2020 with moderate left hydronephrosis noted with perinephric stranding, secondary to 1 cm proximal left ureteral calculus, minimal bilateral pleural effusions noted with adjacent subsegmental atelectasis.  Calcifications noted throughout the pancreas consistent with chronic pancreatitis.  Large amount of stool seen in the sigmoid colon and rectum.  Creatinine currently at 4.84.  Urine output of 2.375 L over the past 24 hours.  Patient seen by urology and patient for uteroscopy with laser lithotripsy and stent placement today 06/25/2020.  Patient was on the Rocephin and has been transitioned to IV Unasyn.  Acidosis improved.  Continue IV fluids with bicarb as AGMA secondary to progressive renal failure.  Follow.  Nephrology following and appreciate input and recommendations.  2.  UTI Urinalysis concerning for UTI as with greater than 50 WBCs, many bacteria, large blood, large leukocytosis and patient noted to have a elevated WBC.  Urine cultures with multiple species.  Blood cultures pending.  Patient was on IV Rocephin however will transition to IV Unasyn due to concurrent concern for possible aspiration pneumonitis.  Follow.  3.  Possible aspiration pneumonitis Patient noted to have a witnessed aspiration event on the morning of admission while trying to drink fluids.  The night of  06/23/2020 patient with an episode of aspiration with pills.  Patient seen by speech therapy.  Continue current diet as recommended by speech therapy.  Leukocytosis trending back up.  DC IV Rocephin placed on IV Unasyn.  Supportive care.  Follow.  4.  Well-controlled diabetes mellitus type  2 Hemoglobin A1c 6.0.  Patient noted to be on insulin prior to admission.  CBG of 301 this morning.  Patient noted to have CBGs in the 2-3 100s last night.  Patient currently n.p.o.  Will place on low-dose Lantus 5 units daily.  Continue sliding scale insulin.  Change CBGs to every 6 hours while NPO.  Follow.  5.  Parkinson's Currently stable.  Continue home medications.  6.  Osteoarthritis Status post prior knee surgery.  Patient seen by PT OT who are recommending home health therapies.  7.  Hyperlipidemia Continue statin.  8.  History of fibroma of the prostate Follows urology at [facility] last seen on 06/16/2020.  Continue current home regimen.  Outpatient follow-up.  9.  Hypocalcemia Likely secondary to malnutrition.  Check a vitamin D 25-hydroxy level.  Patient also noted to be hypomagnesium neck and hypophosphatemic.  Replace magnesium and keep magnesium greater than 2.  Continue oral calcium supplementation.  Outpatient follow-up.  10.-Iron deficiency anemia/B12 deficiency Anemia panel concerning for B12 deficiency and iron deficiency anemia.  Status post 1 unit packed red blood cells (06/23/2020).  Hemoglobin currently at 8.0.  Transfusion threshold hemoglobin < 7.  Follow.  11.  Moderate protein calorie malnutrition Continue nutritional supplementation.  12.  Hypokalemia/hypomagnesemia Magnesium sulfate 4 g IV x1.  K. Dur 40 mEq p.o. x1.  13.  Constipation Noted on CT renal stone protocol.  Dulcolax suppository daily.  MiraLAX 17 g p.o. daily.  Senokot S2 twice daily.  14.  Goals of care Patient presenting with decreased oral intake, noted to be in acute renal failure, with hydronephrosis bilateral.  Patient has been assessed by urology and plan for uteroscopy with laser lithotripsy and stent placement today 06/25/2020.  Foley catheter has been placed.  Patient seen in consultation by palliative care and hospice and once patient is medically stable likely discharge home with hospice  following.  Appreciate palliative care input and recommendations.  DVT prophylaxis: SCDs Code Status: DNR Family Communication: Updated patient and wife at bedside. Disposition:   Status is: Inpatient    Dispo: The patient is from: Home              Anticipated d/c is to: Home with hospice following.              Anticipated d/c date is: 2 to 3 days.              Patient currently awaiting urological procedure for left hydronephrosis with 1 cm mid left ureteral stone.  Patient also in acute renal failure.  Not stable for discharge.        Consultants:   Dr. Arita Miss 06/23/2020  Palliative care: Mayra Reel, NP  Procedures:   Chest x-ray 06/23/2020, 06/24/2019  Renal ultrasound 06/21/2020  CT head 06/21/2020  CT stone protocol 06/24/2020   Antimicrobials:   IV Unasyn 06/25/2020  IV Rocephin 06/21/2020 >>> 06/25/2020   Subjective: Patient laying in bed wife has a wet towel in his mouth.  Denies any chest pain or shortness of breath.  Denies any significant abdominal pain.  Foley catheter with urine noted.  Objective: Vitals:   06/25/20 0159 06/25/20 0621 06/25/20 1331 06/25/20 1345  BP: 140/69 Marland Kitchen)  136/59 133/70   Pulse: 80 76 85   Resp: 17 17 (!) 21   Temp: 98.1 F (36.7 C) 98.9 F (37.2 C) 98 F (36.7 C)   TempSrc: Oral Oral Oral   SpO2: 95% 94% 98%   Weight:    69.4 kg  Height:    5\' 11"  (1.803 m)    Intake/Output Summary (Last 24 hours) at 06/25/2020 1556 Last data filed at 06/25/2020 1205 Gross per 24 hour  Intake --  Output 2175 ml  Net -2175 ml   Filed Weights   06/21/20 1940 06/25/20 1345  Weight: 67.8 kg 69.4 kg    Examination:  General exam: Appears calm and comfortable  Respiratory system: Clear to auscultation anterior lung fields.  No wheezing, no crackles, no rhonchi.  Respiratory effort normal. Cardiovascular system: S1 & S2 heard, RRR. No JVD, murmurs, rubs, gallops or clicks. No pedal edema. Gastrointestinal system: Abdomen  is nondistended, soft and nontender. No organomegaly or masses felt. Normal bowel sounds heard. Central nervous system: Alert. No focal neurological deficits. Extremities: Symmetric 5 x 5 power. Skin: No rashes, lesions or ulcers Psychiatry: Judgement and insight appear normal. Mood & affect appropriate.     Data Reviewed: I have personally reviewed following labs and imaging studies  CBC: Recent Labs  Lab 06/21/20 1312 06/22/20 0407 06/23/20 0426 06/24/20 0334 06/25/20 0530  WBC 13.7* 14.4* 14.7* 12.5* 16.0*  NEUTROABS 11.9*  --  12.8* 11.0* 13.8*  HGB 8.2* 7.9* 7.2* 7.6* 8.0*  HCT 24.6* 24.0* 22.5* 22.8* 23.2*  MCV 101.7* 102.6* 105.6* 100.4* 96.7  PLT 167 164 158 160 189    Basic Metabolic Panel: Recent Labs  Lab 06/21/20 1312 06/22/20 0407 06/23/20 0421 06/23/20 0426 06/24/20 0334 06/25/20 0530  NA 142 143  --  144 144 145  K 4.3 4.4  --  4.4 3.5 3.0*  CL 117* 121*  --  121* 120* 108  CO2 10* 8*  --  7* 12* 19*  GLUCOSE 102* 83  --  192* 207* 296*  BUN 81* 82*  --  84* 87* 93*  CREATININE 4.76* 4.83*  --  5.05* 5.11* 4.84*  CALCIUM 6.1* 6.2*  --  6.6* 6.2* 5.9*  MG  --   --   --   --  1.5* 1.6*  PHOS  --   --  4.6  --  5.0* 3.1    GFR: Estimated Creatinine Clearance: 12.1 mL/min (A) (by C-G formula based on SCr of 4.84 mg/dL (H)).  Liver Function Tests: Recent Labs  Lab 06/21/20 1312 06/22/20 0407 06/24/20 0334 06/25/20 0530  AST 14* 11* 10* 22  ALT 8 7 5 10   ALKPHOS 179* 160* 122 138*  BILITOT 0.2* 0.4 0.7 0.7  PROT 6.6 6.1* 5.6* 6.0*  ALBUMIN 3.6 3.4* 2.8* 2.9*    CBG: Recent Labs  Lab 06/24/20 1711 06/24/20 2140 06/25/20 0730 06/25/20 1202 06/25/20 1341  GLUCAP 221* 377* 301* 360* 311*     Recent Results (from the past 240 hour(s))  SARS Coronavirus 2 by RT PCR (hospital order, performed in [facility] hospital lab) Nasopharyngeal Nasopharyngeal Swab     Status: None   Collection Time: 06/21/20  3:20 PM   Specimen: Nasopharyngeal  Swab  Result Value Ref Range Status   SARS Coronavirus 2 NEGATIVE NEGATIVE Final    Comment: (NOTE) SARS-CoV-2 target nucleic acids are NOT DETECTED.  The SARS-CoV-2 RNA is generally detectable in upper and lower respiratory specimens during the acute phase of  infection. The lowest concentration of SARS-CoV-2 viral copies this assay can detect is 250 copies / mL. A negative result does not preclude SARS-CoV-2 infection and should not be used as the sole basis for treatment or other patient management decisions.  A negative result may occur with improper specimen collection / handling, submission of specimen other than nasopharyngeal swab, presence of viral mutation(s) within the areas targeted by this assay, and inadequate number of viral copies (<250 copies / mL). A negative result must be combined with clinical observations, patient history, and epidemiological information.  Fact Sheet for Patients:   BoilerBrush.com.cy  Fact Sheet for Healthcare Providers: https://pope.com/  This test is not yet approved or  cleared by the [place] FDA and has been authorized for detection and/or diagnosis of SARS-CoV-2 by FDA under an Emergency Use Authorization (EUA).  This EUA will remain in effect (meaning this test can be used) for the duration of the COVID-19 declaration under Section 564(b)(1) of the Act, 21 U.S.C. section 360bbb-3(b)(1), unless the authorization is terminated or revoked sooner.  Performed at [facility], [address] [place], [place] [address]   Urine Culture     Status: Abnormal   Collection Time: 06/21/20  4:01 PM   Specimen: Urine, Random  Result Value Ref Range Status   Specimen Description   Final    URINE, RANDOM Performed at [facility], [address], [place], [place] [address]    Special Requests   Final    NONE Performed at [facility],  [address], [place], [place] [address]    Culture MULTIPLE SPECIES PRESENT, SUGGEST RECOLLECTION (A)  Final   Report Status 06/22/2020 FINAL  Final  Culture, blood (routine x 2)     Status: None (Preliminary result)   Collection Time: 06/22/20  1:12 PM   Specimen: BLOOD  Result Value Ref Range Status   Specimen Description   Final    BLOOD LEFT HAND Performed at [facility], [address] [place], [place] [address]    Special Requests   Final    BOTTLES DRAWN AEROBIC ONLY Blood Culture adequate volume Performed at [facility], [address], [place], [place] [address]    Culture   Final    NO GROWTH 3 DAYS Performed at [facility], [address], [place], [place] [address]    Report Status PENDING  Incomplete  Culture, blood (routine x 2)     Status: None (Preliminary result)   Collection Time: 06/22/20  1:16 PM   Specimen: BLOOD  Result Value Ref Range Status   Specimen Description   Final    BLOOD LEFT ANTECUBITAL Performed at [facility], [address], [place], [place] [address]    Special Requests   Final    BOTTLES DRAWN AEROBIC AND ANAEROBIC Blood Culture adequate volume Performed at [facility], [address], [place], [place] [address]    Culture   Final    NO GROWTH 3 DAYS Performed at [facility], [address] [place], [place] [address]    Report Status PENDING  Incomplete         Radiology Studies: DG CHEST PORT 1 VIEW  Result Date: 06/23/2020 CLINICAL DATA:  Aspiration EXAM: PORTABLE CHEST 1 VIEW COMPARISON:  06/21/2020 FINDINGS: Lungs are clear. No pneumothorax or pleural effusion. Cardiac size within normal limits. Pulmonary vascularity is normal. Neurostimulator battery pack overlies the  right hemithorax with the electrode extending superiorly, unchanged. IMPRESSION: No active disease. Electronically Signed   By: Helyn Numbers MD   On: 06/23/2020  22:55   DG C-Arm 1-60 Min-No Report  Result Date: 06/25/2020 Fluoroscopy was utilized by the requesting physician.  No radiographic interpretation.   CT RENAL STONE STUDY  Result Date: 06/24/2020 CLINICAL DATA:  Acute renal failure. EXAM: CT ABDOMEN AND PELVIS WITHOUT CONTRAST TECHNIQUE: Multidetector CT imaging of the abdomen and pelvis was performed following the standard protocol without IV contrast. COMPARISON:  None. FINDINGS: Lower chest: Minimal bilateral pleural effusions are noted with adjacent subsegmental atelectasis. Hepatobiliary: No focal liver abnormality is seen. No gallstones, gallbladder wall thickening, or biliary dilatation. Pancreas: Calcifications are noted throughout the pancreas consistent with chronic pancreatitis. No definite acute abnormality or ductal dilatation is noted. Spleen: Probable cyst or hemangioma is noted anteriorly. Adrenals/Urinary Tract: Adrenal glands appear normal. Right kidney and ureter are unremarkable. Moderate left hydronephrosis is noted with perinephric stranding, secondary to 1 cm proximal left ureteral calculus. Foley catheter is noted in urinary bladder. Stomach/Bowel: The stomach appears normal. There is no evidence of bowel obstruction or inflammation. The appendix is not clearly visualized, but no inflammation is noted in the right lower quadrant. Large amount of stool seen in the sigmoid colon and rectum. Vascular/Lymphatic: Aortic atherosclerosis. No enlarged abdominal or pelvic lymph nodes. Reproductive: Prostate is unremarkable. Other: No abdominal wall hernia or abnormality. No abdominopelvic ascites. Musculoskeletal: No acute or significant osseous findings. IMPRESSION: 1. Moderate left hydronephrosis is noted with perinephric stranding, secondary to 1 cm proximal left ureteral calculus. 2. Minimal bilateral pleural effusions are noted with adjacent subsegmental atelectasis. 3. Calcifications are noted throughout the pancreas consistent with  chronic pancreatitis. 4. Large amount of stool seen in the sigmoid colon and rectum. Aortic Atherosclerosis (ICD10-I70.0). Electronically Signed   By: Lupita Raider M.D.   On: 06/24/2020 13:51        Scheduled Meds: . [MAR Hold] sodium chloride   Intravenous Once  . bisacodyl  10 mg Rectal Daily  . [MAR Hold] calcium carbonate  400 mg of elemental calcium Oral TID  . [MAR Hold] Carbidopa-Levodopa ER  1.5 tablet Oral TID  . [MAR Hold] Chlorhexidine Gluconate Cloth  6 each Topical Daily  . [MAR Hold] donepezil  5 mg Oral QHS  . [MAR Hold] ferrous sulfate  325 mg Oral Q breakfast  . [MAR Hold] insulin aspart  0-5 Units Subcutaneous QHS  . [MAR Hold] insulin aspart  0-9 Units Subcutaneous Q6H  . [MAR Hold] insulin glargine  5 Units Subcutaneous Daily  . [MAR Hold] lipase/protease/amylase  36,000 Units Oral TID with meals  . [MAR Hold] magnesium oxide  400 mg Oral BID  . polyethylene glycol  17 g Oral Daily  . potassium chloride  40 mEq Oral Q4H  . [MAR Hold] rOPINIRole  2 mg Oral QHS  . senna-docusate  1 tablet Oral BID  . [MAR Hold] venlafaxine XR  225 mg Oral Q breakfast  . [MAR Hold] vitamin B-12  1,000 mcg Oral Daily   Continuous Infusions: . sodium chloride 750 mL/hr at 06/25/20 1550  . [MAR Hold] ampicillin-sulbactam (UNASYN) IV 3 g (06/25/20 1002)  .  sodium bicarbonate (isotonic) infusion in sterile water Stopped (06/25/20 1610)     LOS: 4 days    Time spent: 35 minutes    Ramiro Harvest, MD [facility]   To contact the attending provider between 7A-7P or the covering provider during after  hours 7P-7A, please log into the web site [email or web address] and access using universal [place] password for that web site. If you do not have the password, please call the hospital operator.  06/25/2020, 3:56 PM

## 2020-06-25 NOTE — Anesthesia Procedure Notes (Signed)
Procedure Name: LMA Insertion Date/Time: 06/25/2020 2:43 PM Performed by: Basilio Cairo, CRNA Pre-anesthesia Checklist: Patient identified, Patient being monitored, Timeout performed, Emergency Drugs available and Suction available Patient Re-evaluated:Patient Re-evaluated prior to induction Oxygen Delivery Method: Circle system utilized Preoxygenation: Pre-oxygenation with 100% oxygen Induction Type: IV induction Ventilation: Mask ventilation without difficulty LMA: LMA inserted LMA Size: 4.0 Tube type: Oral Number of attempts: 1 Placement Confirmation: positive ETCO2 and breath sounds checked- equal and bilateral Tube secured with: Tape Dental Injury: Teeth and Oropharynx as per pre-operative assessment

## 2020-06-25 NOTE — Care Management Important Message (Signed)
Important Message  Patient Details IM Letter given to Sandford Craze RN Case Manager to present to the Patient Name: Randall [name] MRN: [id number] Date of Birth: [birth year]   Medicare Important Message Given:  Yes     Caren Macadam 06/25/2020, 9:55 AM

## 2020-06-26 ENCOUNTER — Encounter (HOSPITAL_COMMUNITY): Payer: Self-pay | Admitting: Urology

## 2020-06-26 DIAGNOSIS — Z515 Encounter for palliative care: Secondary | ICD-10-CM

## 2020-06-26 DIAGNOSIS — E870 Hyperosmolality and hypernatremia: Secondary | ICD-10-CM | POA: Diagnosis not present

## 2020-06-26 DIAGNOSIS — R531 Weakness: Secondary | ICD-10-CM

## 2020-06-26 DIAGNOSIS — Z7189 Other specified counseling: Secondary | ICD-10-CM

## 2020-06-26 LAB — CBC WITH DIFFERENTIAL/PLATELET
Abs Immature Granulocytes: 0.15 10*3/uL — ABNORMAL HIGH (ref 0.00–0.07)
Basophils Absolute: 0 10*3/uL (ref 0.0–0.1)
Basophils Relative: 0 %
Eosinophils Absolute: 0 10*3/uL (ref 0.0–0.5)
Eosinophils Relative: 0 %
HCT: 24.6 % — ABNORMAL LOW (ref 39.0–52.0)
Hemoglobin: 8.4 g/dL — ABNORMAL LOW (ref 13.0–17.0)
Immature Granulocytes: 1 %
Lymphocytes Relative: 3 %
Lymphs Abs: 0.7 10*3/uL (ref 0.7–4.0)
MCH: 33.7 pg (ref 26.0–34.0)
MCHC: 34.1 g/dL (ref 30.0–36.0)
MCV: 98.8 fL (ref 80.0–100.0)
Monocytes Absolute: 1.5 10*3/uL — ABNORMAL HIGH (ref 0.1–1.0)
Monocytes Relative: 8 %
Neutro Abs: 17.1 10*3/uL — ABNORMAL HIGH (ref 1.7–7.7)
Neutrophils Relative %: 88 %
Platelets: 205 10*3/uL (ref 150–400)
RBC: 2.49 MIL/uL — ABNORMAL LOW (ref 4.22–5.81)
RDW: 14.3 % (ref 11.5–15.5)
WBC: 19.5 10*3/uL — ABNORMAL HIGH (ref 4.0–10.5)
nRBC: 0 % (ref 0.0–0.2)

## 2020-06-26 LAB — MAGNESIUM: Magnesium: 2.5 mg/dL — ABNORMAL HIGH (ref 1.7–2.4)

## 2020-06-26 LAB — BASIC METABOLIC PANEL
Anion gap: 17 — ABNORMAL HIGH (ref 5–15)
BUN: 82 mg/dL — ABNORMAL HIGH (ref 8–23)
CO2: 21 mmol/L — ABNORMAL LOW (ref 22–32)
Calcium: 5.7 mg/dL — CL (ref 8.9–10.3)
Chloride: 112 mmol/L — ABNORMAL HIGH (ref 98–111)
Creatinine, Ser: 4.7 mg/dL — ABNORMAL HIGH (ref 0.61–1.24)
GFR calc Af Amer: 13 mL/min — ABNORMAL LOW (ref >60)
GFR calc non Af Amer: 11 mL/min — ABNORMAL LOW (ref >60)
Glucose, Bld: 152 mg/dL — ABNORMAL HIGH (ref 70–99)
Potassium: 4 mmol/L (ref 3.5–5.1)
Sodium: 150 mmol/L — ABNORMAL HIGH (ref 135–145)

## 2020-06-26 LAB — URINALYSIS, ROUTINE W REFLEX MICROSCOPIC
Bilirubin Urine: NEGATIVE
Glucose, UA: 50 mg/dL — AB
Ketones, ur: NEGATIVE mg/dL
Nitrite: NEGATIVE
Protein, ur: 100 mg/dL — AB
RBC / HPF: >50 RBC/hpf — ABNORMAL HIGH (ref 0–5)
Specific Gravity, Urine: 1.009 (ref 1.005–1.030)
WBC, UA: >50 WBC/hpf — ABNORMAL HIGH (ref 0–5)
pH: 5 (ref 5.0–8.0)

## 2020-06-26 LAB — RENAL FUNCTION PANEL
Albumin: 2.8 g/dL — ABNORMAL LOW (ref 3.5–5.0)
Anion gap: 17 — ABNORMAL HIGH (ref 5–15)
BUN: 83 mg/dL — ABNORMAL HIGH (ref 8–23)
CO2: 22 mmol/L (ref 22–32)
Calcium: 5.9 mg/dL — CL (ref 8.9–10.3)
Chloride: 112 mmol/L — ABNORMAL HIGH (ref 98–111)
Creatinine, Ser: 4.76 mg/dL — ABNORMAL HIGH (ref 0.61–1.24)
GFR calc Af Amer: 13 mL/min — ABNORMAL LOW (ref >60)
GFR calc non Af Amer: 11 mL/min — ABNORMAL LOW (ref >60)
Glucose, Bld: 168 mg/dL — ABNORMAL HIGH (ref 70–99)
Phosphorus: 2.9 mg/dL (ref 2.5–4.6)
Potassium: 4 mmol/L (ref 3.5–5.1)
Sodium: 151 mmol/L — ABNORMAL HIGH (ref 135–145)

## 2020-06-26 LAB — GLUCOSE, CAPILLARY
Glucose-Capillary: 151 mg/dL — ABNORMAL HIGH (ref 70–99)
Glucose-Capillary: 169 mg/dL — ABNORMAL HIGH (ref 70–99)
Glucose-Capillary: 309 mg/dL — ABNORMAL HIGH (ref 70–99)
Glucose-Capillary: 323 mg/dL — ABNORMAL HIGH (ref 70–99)
Glucose-Capillary: 342 mg/dL — ABNORMAL HIGH (ref 70–99)

## 2020-06-26 LAB — VITAMIN D 25 HYDROXY (VIT D DEFICIENCY, FRACTURES): Vit D, 25-Hydroxy: <4.2 ng/mL — ABNORMAL LOW (ref 30–100)

## 2020-06-26 MED ORDER — DEXTROSE 5 % IV SOLN: INTRAVENOUS | Status: DC

## 2020-06-26 MED ORDER — LORAZEPAM 2 MG/ML IJ SOLN
0.5000 mg | Freq: Two times a day (BID) | INTRAMUSCULAR | Status: DC | PRN
  Administered 2020-06-26 – 2020-06-28 (×3): 0.5 mg via INTRAVENOUS
  Filled 2020-06-26 (×3): qty 1

## 2020-06-26 MED ORDER — CHLORHEXIDINE GLUCONATE CLOTH 2 % EX PADS
6.0000 | MEDICATED_PAD | Freq: Every day | CUTANEOUS | Status: DC
  Administered 2020-06-26 – 2020-06-29 (×4): 6 via TOPICAL

## 2020-06-26 MED ORDER — INSULIN GLARGINE 100 UNIT/ML ~~LOC~~ SOLN
5.0000 [IU] | Freq: Two times a day (BID) | SUBCUTANEOUS | Status: DC
  Administered 2020-06-26 – 2020-06-29 (×6): 5 [IU] via SUBCUTANEOUS
  Filled 2020-06-26 (×6): qty 0.05

## 2020-06-26 MED ORDER — CALCIUM GLUCONATE-NACL 2-0.675 GM/100ML-% IV SOLN
2.0000 g | Freq: Once | INTRAVENOUS | Status: AC
  Administered 2020-06-26: 2000 mg via INTRAVENOUS
  Filled 2020-06-26: qty 100

## 2020-06-26 MED ORDER — VITAMIN D (ERGOCALCIFEROL) 1.25 MG (50000 UNIT) PO CAPS
50000.0000 [IU] | ORAL_CAPSULE | ORAL | Status: DC
  Administered 2020-06-26: 50000 [IU] via ORAL
  Filled 2020-06-26: qty 1

## 2020-06-26 NOTE — Progress Notes (Signed)
At 1307, a critical lab (calcium 5.7) was reported to Ramiro Harvest, MD.

## 2020-06-26 NOTE — Progress Notes (Signed)
Civil engineer, contracting [facility]) Hospital Liaison Note        Spoke with Mrs. Shearer who reports pt seems to be feeling better, reports pt has shown improved appetite.   Plan made for hospital liaison team to continue to follow pt to plan appropriately for care after discharge.    Gillian Scarce, BSN, RN [facility] Liaison   [phone] (24h on call)

## 2020-06-26 NOTE — Progress Notes (Signed)
Urology Inpatient Progress Report  Hypocalcemia [E83.51] ARF (acute renal failure) (HCC) [N17.9] Weakness [R53.1] Acute UTI [N39.0] AKI (acute kidney injury) (HCC) [N17.9]  Procedure(s): CYSTOSCOPY WITH LEFT RETROGRADE PYELOGRAM URETEROSCOPY WITH LASER AND LEFT URETERAL STENT PLACEMENT  1 Day Post-Op   Intv/Subj:  Patient underwent ureteroscopy with laser lithotripsy of left ureteral calculus yesterday.    He had a rough night with inability to sleep.    Principal Problem:   ARF (acute renal failure) (HCC) Active Problems:   Diabetes (HCC)   Parkinson disease (HCC)   S/P total knee arthroplasty   Hyperlipidemia   Benign fibroma of prostate   Acute lower UTI   DNR (do not resuscitate)   Hypokalemia   Hyperphosphatemia   Metabolic acidosis, increased anion gap   Hypomagnesemia   Palliative care by specialist   Hydronephrosis with renal and ureteral calculus obstruction   Hypocalcemia   Hypernatremia  Current Facility-Administered Medications  Medication Dose Route Frequency Provider Last Rate Last Admin  . 0.9 %  sodium chloride infusion (Manually program via Guardrails IV Fluids)   Intravenous Once Zigmund Daniel., MD   Stopped at 06/23/20 1631  . acetaminophen (TYLENOL) tablet 650 mg  650 mg Oral Q6H PRN Jerald Kief, MD   650 mg at 06/25/20 2056   Or  . acetaminophen (TYLENOL) suppository 650 mg  650 mg Rectal Q6H PRN Jerald Kief, MD      . albuterol (PROVENTIL) (2.5 MG/3ML) 0.083% nebulizer solution 2.5 mg  2.5 mg Nebulization Q6H PRN Zigmund Daniel., MD   2.5 mg at 06/23/20 2228  . bisacodyl (DULCOLAX) suppository 10 mg  10 mg Rectal Daily Rodolph Bong, MD      . calcium carbonate (TUMS - dosed in mg elemental calcium) chewable tablet 400 mg of elemental calcium  400 mg of elemental calcium Oral TID Marikay Alar, FNP   400 mg of elemental calcium at 06/25/20 2128  . Carbidopa-Levodopa ER (SINEMET CR) 25-100 MG tablet controlled release 1.5  tablet  1.5 tablet Oral TID Jerald Kief, MD   1.5 tablet at 06/25/20 2129  . cefTRIAXone (ROCEPHIN) 2 g in sodium chloride 0.9 % 100 mL IVPB  2 g Intravenous Q24H Rodolph Bong, MD 200 mL/hr at 06/25/20 1833 2 g at 06/25/20 1833  . Chlorhexidine Gluconate Cloth 2 % PADS 6 each  6 each Topical Daily Zigmund Daniel., MD   6 each at 06/25/20 [phone]  . dextrose 5 % solution   Intravenous Continuous Rodolph Bong, MD      . donepezil (ARICEPT) tablet 5 mg  5 mg Oral QHS Jerald Kief, MD   5 mg at 06/25/20 2128  . feeding supplement (ENSURE ENLIVE) (ENSURE ENLIVE) liquid 237 mL  237 mL Oral BID PRN Zigmund Daniel., MD      . ferrous sulfate tablet 325 mg  325 mg Oral Q breakfast Zigmund Daniel., MD   325 mg at 06/25/20 0734  . food thickener (THICK IT) powder   Oral PRN Rodolph Bong, MD      . insulin aspart (novoLOG) injection 0-5 Units  0-5 Units Subcutaneous QHS Jerald Kief, MD   5 Units at 06/24/20 2143  . insulin aspart (novoLOG) injection 0-9 Units  0-9 Units Subcutaneous Q6H Rodolph Bong, MD   2 Units at 06/26/20 [phone]  . insulin glargine (LANTUS) injection 5 Units  5 Units Subcutaneous Daily Rodolph Bong,  MD   5 Units at 06/25/20 1004  . lipase/protease/amylase (CREON) capsule 36,000 Units  36,000 Units Oral TID with meals Jerald Kiefhiu, Stephen K, MD   36,000 Units at 06/24/20 1439  . lipase/protease/amylase (CREON) capsule 36,000 Units  36,000 Units Oral PRN Jerald Kiefhiu, Stephen K, MD      . magnesium oxide (MAG-OX) tablet 400 mg  400 mg Oral BID Zigmund DanielPowell, A Caldwell Jr., MD   400 mg at 06/25/20 2129  . metroNIDAZOLE (FLAGYL) IVPB 500 mg  500 mg Intravenous Q8H Rodolph Bonghompson, Daniel V, MD 100 mL/hr at 06/26/20 0643 500 mg at 06/26/20 0643  . ondansetron (ZOFRAN) injection 4 mg  4 mg Intravenous Q6H PRN Marikay Alarenny, Melanie, FNP   4 mg at 06/25/20 0239  . polyethylene glycol (MIRALAX / GLYCOLAX) packet 17 g  17 g Oral Daily Rodolph Bonghompson, Daniel V, MD      . Resource  ThickenUp Clear   Oral PRN Lurene ShadowSpongberg, Susy Frizzlehristopher N, MD      . rOPINIRole (REQUIP) tablet 2 mg  2 mg Oral QHS Marzetta BoardSpongberg, Christopher N, MD   2 mg at 06/25/20 2128  . senna-docusate (Senokot-S) tablet 1 tablet  1 tablet Oral BID Rodolph Bonghompson, Daniel V, MD      . venlafaxine XR [facility](EFFEXOR-XR) 24 hr capsule 225 mg  225 mg Oral Q breakfast Jerald Kiefhiu, Stephen K, MD   225 mg at 06/25/20 0734  . vitamin B-12 (CYANOCOBALAMIN) tablet 1,000 mcg  1,000 mcg Oral Daily Zigmund DanielPowell, A Caldwell Jr., MD   1,000 mcg at 06/24/20 0910     Objective: Vital: Vitals:   06/25/20 1724 06/25/20 2106 06/26/20 0059 06/26/20 0519  BP: (!) 145/79 140/71 135/70 (!) 169/75  Pulse: 88 96 85 81  Resp: 19 19 19 19   Temp: 98.2 F (36.8 C) 98.1 F (36.7 C) 99.4 F (37.4 C) 98.3 F (36.8 C)  TempSrc: Oral  Oral   SpO2: 98% 98% 99% (!) 85%  Weight:      Height:       I/Os: I/O last 3 completed shifts: In: -  Out: 3875 [Urine:3875]  Physical Exam:  General: Patient is in no apparent distress Lungs: Normal respiratory effort, chest expands symmetrically. GI: The abdomen is soft and nontender  Foley: draining clear yellow urine Ext: lower extremities symmetric  Lab Results: Recent Labs    06/24/20 0334 06/25/20 0530 06/26/20 0434  WBC 12.5* 16.0* 19.5*  HGB 7.6* 8.0* 8.4*  HCT 22.8* 23.2* 24.6*   Recent Labs    06/24/20 0334 06/25/20 0530 06/26/20 0434  NA 144 145 151*  K 3.5 3.0* 4.0  CL 120* 108 112*  CO2 12* 19* 22  GLUCOSE 207* 296* 168*  BUN 87* 93* 83*  CREATININE 5.11* 4.84* 4.76*  CALCIUM 6.2* 5.9* 5.9*   No results for input(s): LABPT, INR in the last 72 hours. No results for input(s): LABURIN in the last 72 hours. Results for orders placed or performed during the hospital encounter of 06/21/20  SARS Coronavirus 2 by RT PCR (hospital order, performed in [facility]) Nasopharyngeal Nasopharyngeal Swab     Status: None   Collection Time: 06/21/20  3:20 PM   Specimen: Nasopharyngeal Swab   Result Value Ref Range Status   SARS Coronavirus 2 NEGATIVE NEGATIVE Final    Comment: (NOTE) SARS-CoV-2 target nucleic acids are NOT DETECTED.  The SARS-CoV-2 RNA is generally detectable in upper and lower respiratory specimens during the acute phase of infection. The lowest concentration of SARS-CoV-2 viral copies this  assay can detect is 250 copies / mL. A negative result does not preclude SARS-CoV-2 infection and should not be used as the sole basis for treatment or other patient management decisions.  A negative result may occur with improper specimen collection / handling, submission of specimen other than nasopharyngeal swab, presence of viral mutation(s) within the areas targeted by this assay, and inadequate number of viral copies (<250 copies / mL). A negative result must be combined with clinical observations, patient history, and epidemiological information.  Fact Sheet for Patients:   BoilerBrush.com.cy  Fact Sheet for Healthcare Providers: https://pope.com/  This test is not yet approved or  cleared by the [place] FDA and has been authorized for detection and/or diagnosis of SARS-CoV-2 by FDA under an Emergency Use Authorization (EUA).  This EUA will remain in effect (meaning this test can be used) for the duration of the COVID-19 declaration under Section 564(b)(1) of the Act, 21 U.S.C. section 360bbb-3(b)(1), unless the authorization is terminated or revoked sooner.  Performed at [facility], [address], [place], [place] [address]   Urine Culture     Status: Abnormal   Collection Time: 06/21/20  4:01 PM   Specimen: Urine, Random  Result Value Ref Range Status   Specimen Description   Final    URINE, RANDOM Performed at [facility], [address] [place], [place] [address]    Special Requests   Final    NONE Performed at [facility], [address], [place], [place] [address]    Culture MULTIPLE SPECIES PRESENT, SUGGEST RECOLLECTION (A)  Final   Report Status 06/22/2020 FINAL  Final  Culture, blood (routine x 2)     Status: None (Preliminary result)   Collection Time: 06/22/20  1:12 PM   Specimen: BLOOD  Result Value Ref Range Status   Specimen Description   Final    BLOOD LEFT HAND Performed at [facility], [address], [place], [place] [address]    Special Requests   Final    BOTTLES DRAWN AEROBIC ONLY Blood Culture adequate volume Performed at [facility], [address], [place], [place] [address]    Culture   Final    NO GROWTH 3 DAYS Performed at [facility], [address], [place], [place] [address]    Report Status PENDING  Incomplete  Culture, blood (routine x 2)     Status: None (Preliminary result)   Collection Time: 06/22/20  1:16 PM   Specimen: BLOOD  Result Value Ref Range Status   Specimen Description   Final    BLOOD LEFT ANTECUBITAL Performed at [facility], [address] [place], [place] [address]    Special Requests   Final    BOTTLES DRAWN AEROBIC AND ANAEROBIC Blood Culture adequate volume Performed at [facility], [address], [place], [place] [address]    Culture   Final    NO GROWTH 3 DAYS Performed at [facility], [address], [place], [place] [address]    Report Status PENDING  Incomplete    Studies/Results: DG C-Arm 1-60 Min-No Report  Result Date: 06/25/2020 Fluoroscopy was utilized by the requesting physician.  No radiographic interpretation.   CT RENAL STONE STUDY  Result Date: 06/24/2020 CLINICAL DATA:  Acute renal failure. EXAM: CT ABDOMEN AND PELVIS WITHOUT CONTRAST TECHNIQUE: Multidetector CT imaging of the abdomen and pelvis was performed following the standard protocol without  IV contrast. COMPARISON:  None. FINDINGS: Lower chest: Minimal bilateral pleural  effusions are noted with adjacent subsegmental atelectasis. Hepatobiliary: No focal liver abnormality is seen. No gallstones, gallbladder wall thickening, or biliary dilatation. Pancreas: Calcifications are noted throughout the pancreas consistent with chronic pancreatitis. No definite acute abnormality or ductal dilatation is noted. Spleen: Probable cyst or hemangioma is noted anteriorly. Adrenals/Urinary Tract: Adrenal glands appear normal. Right kidney and ureter are unremarkable. Moderate left hydronephrosis is noted with perinephric stranding, secondary to 1 cm proximal left ureteral calculus. Foley catheter is noted in urinary bladder. Stomach/Bowel: The stomach appears normal. There is no evidence of bowel obstruction or inflammation. The appendix is not clearly visualized, but no inflammation is noted in the right lower quadrant. Large amount of stool seen in the sigmoid colon and rectum. Vascular/Lymphatic: Aortic atherosclerosis. No enlarged abdominal or pelvic lymph nodes. Reproductive: Prostate is unremarkable. Other: No abdominal wall hernia or abnormality. No abdominopelvic ascites. Musculoskeletal: No acute or significant osseous findings. IMPRESSION: 1. Moderate left hydronephrosis is noted with perinephric stranding, secondary to 1 cm proximal left ureteral calculus. 2. Minimal bilateral pleural effusions are noted with adjacent subsegmental atelectasis. 3. Calcifications are noted throughout the pancreas consistent with chronic pancreatitis. 4. Large amount of stool seen in the sigmoid colon and rectum. Aortic Atherosclerosis (ICD10-I70.0). Electronically Signed   By: Lupita Raider M.D.   On: 06/24/2020 13:51    Assessment: 79 yo man with hx of Parkinsons admitted for weakness found to have 1 cm mid left ureteral calculus with AKI now POD1 s/p LEFT ureteroscopy, laser lithotripsy and stent placement.   Plan: -stone broke up well however visualization was poor and could not basket all of  fragments -will plan to reimage in 2 weeks to see if fragments are still present and patient needs another procedure -will arrange outpatient follow up at [facility] -continue foley for maximal drainage    Kasandra Knudsen, MD Urology 06/26/2020, 7:57 AM

## 2020-06-26 NOTE — Progress Notes (Signed)
Critical from [facility] in lab of 5.9 on call made aware. See new orders if any.

## 2020-06-26 NOTE — Progress Notes (Signed)
Physical Therapy Treatment Patient Details Name: Randall [name] MRN: [id number] DOB: [birth year] Today's Date: 06/26/2020    History of Present Illness 79 yo admitted with acute renal failure due to poor PO intake x 5 days with AMS and weakness. PMhx: Parkinson's, DM, arthritis    PT Comments    Pt OOB in recliner with spouse in room.  General transfer comment: increased time and rigidity throughout (Parkinsons) with moderate posterior lean/LOB with poor self correction.  HIGH FALL RISK.  No c/o dizziness this session. General Gait Details: Spouse assisted by following with recliner.  Pt tolerated amb 40 feet x 2 with one seated rest break between. Short shuffled steps and forward flex posture.  Typical Parkinson's gait. Positioned in recliner to comfort.    Follow Up Recommendations  Home health PT;Supervision for mobility/OOB [facility])     Equipment Recommendations  None recommended by PT    Recommendations for Other Services       Precautions / Restrictions Precautions Precautions: Fall Precaution Comments: Hx Parkinsons    Mobility  Bed Mobility               General bed mobility comments: OOB in recliner  Transfers Overall transfer level: Needs assistance Equipment used: Rolling walker (2 wheeled) Transfers: Sit to/from Stand Sit to Stand: From elevated surface;Min assist;Mod assist Stand pivot transfers: Mod assist       General transfer comment: increased time and rigidity throughout (Parkinsons) with moderate posterior lean/LOB with poor self correction.  HIGH FALL RISK.  No c/o dizziness this session.  Ambulation/Gait Ambulation/Gait assistance: Min assist;Mod assist Gait Distance (Feet): 80 Feet Assistive device: Rolling walker (2 wheeled) Gait Pattern/deviations: Shuffle;Trunk flexed;Narrow base of support Gait velocity: decreased   General Gait Details: Spouse assisted by following with recliner.  Pt tolerated amb 40 feet x 2 with one seated rest  break between. Short shuffled steps and forward flex posture.  Typical Parkinson's gait.   Stairs             Wheelchair Mobility    Modified Rankin (Stroke Patients Only)       Balance                                            Cognition Arousal/Alertness: Awake/alert Behavior During Therapy: WFL for tasks assessed/performed Overall Cognitive Status: Within Functional Limits for tasks assessed                                 General Comments: AxO x 3 pleasanr      Exercises      General Comments        Pertinent Vitals/Pain Pain Assessment: Faces Faces Pain Scale: Hurts a little bit Pain Descriptors / Indicators: Tightness Pain Intervention(s): Monitored during session;Repositioned    Home Living                      Prior Function            PT Goals (current goals can now be found in the care plan section) Progress towards PT goals: Progressing toward goals    Frequency    Min 3X/week      PT Plan Current plan remains appropriate    Co-evaluation  AM-PAC PT "6 Clicks" Mobility   Outcome Measure    Help needed moving from lying on your back to sitting on the side of a flat bed without using bedrails?: A Little Help needed moving to and from a bed to a chair (including a wheelchair)?: A Little Help needed standing up from a chair using your arms (e.g., wheelchair or bedside chair)?: A Little Help needed to walk in hospital room?: A Lot Help needed climbing 3-5 steps with a railing? : A Lot 6 Click Score: 13    End of Session Equipment Utilized During Treatment: Gait belt Activity Tolerance: Patient limited by fatigue Patient left: in chair;with call bell/phone within reach;with chair alarm set;with family/visitor present Nurse Communication: Mobility status PT Visit Diagnosis: Other abnormalities of gait and mobility (R26.89);Difficulty in walking, not elsewhere classified  (R26.2)     Time: 6644-0347 PT Time Calculation (min) (ACUTE ONLY): 27 min  Charges:  $Gait Training: 8-22 mins $Therapeutic Activity: 8-22 mins                     Felecia Shelling  PTA Acute  Rehabilitation Services Pager      [phone] Office      [phone]

## 2020-06-26 NOTE — Progress Notes (Signed)
PROGRESS NOTE    Randall [name]  KKX:[id number] DOB: December 19, 1940 DOA: 06/21/2020 PCP: Gaspar Garbe, MD    Chief Complaint  Patient presents with  . Weakness    Brief Narrative:  Randall Mounsey Bookeris a 79 y.o.malewith medical history significant ofparkinson's disease, DM2, arthritis who presented to the ED with concerns of increased weakness and poor PO intake.  Pt apparently in his usual state of health until 5 days prior to admit when he lost his appetite and was taking minimal PO.  He was losing strength and became unable to stand with assistance the day prior to admit.  Concern for aspiration events.  He was admitted with AKI and concern for UTI.  Renal US shows bilateral hydro and he's now s/p foley placement.  Wife had been looking into hospice prior to admission and palliative care was c/s and palliative care is following.  Renal CT with moderate left hydronephrosis with perinephric stranding secondary to 1 cm proximal left ureteral calculus, large amount of stool seen in the sigmoid colon and rectum.  Patient has been assessed by urology and patient to undergo cystoscopy with left retrograde pyelogram ureteroscopy with laser and left ureteral stent placement today 06/25/2020. Dispo is pending improvement in renal function/overall status.         Assessment & Plan:   Principal Problem:   ARF (acute renal failure) (HCC) Active Problems:   Diabetes (HCC)   Parkinson disease (HCC)   S/P total knee arthroplasty   Hyperlipidemia   Benign fibroma of prostate   Acute lower UTI   DNR (do not resuscitate)   Hypokalemia   Hyperphosphatemia   Metabolic acidosis, increased anion gap   Hypomagnesemia   Palliative care by specialist   Hydronephrosis with renal and ureteral calculus obstruction   Hypocalcemia   Hypernatremia  1 acute renal failure/bilateral hydronephrosis/anion gap metabolic acidosis Patient had presented with increased weakness, decreased oral intake noted to  be in acute renal failure with a creatinine on admission of 4.76 which went up as high as 5.11.  Last creatinine noted on epic was 0.62 on 03/10/2014.  Baseline creatinine per Dr. Lowell Guitar was 1.39 in 2020.  Renal ultrasound on 06/21/2020 with mild right and moderate left hydronephrosis status post Foley catheter placement.  Follow-up CT renal stone protocol 06/24/2020 with moderate left hydronephrosis noted with perinephric stranding, secondary to 1 cm proximal left ureteral calculus, minimal bilateral pleural effusions noted with adjacent subsegmental atelectasis.  Calcifications noted throughout the pancreas consistent with chronic pancreatitis.  Large amount of stool seen in the sigmoid colon and rectum.  Creatinine currently at 4.76.  Urine output of 2.3 L over the past 24 hours.  Patient seen by urology and patient s/p ureteroscopy with laser lithotripsy and stent placement on 06/25/2020.  Patient with a worsening leukocytosis, repeat UA.  Continue IV Rocephin, IV fluids.  DC bicarb drip.  Anion gap metabolic acidosis secondary to progressive renal failure which has improved.  Follow.  Urology following and appreciate their input and recommendations.  2.  UTI Urinalysis concerning for UTI as with greater than 50 WBCs, many bacteria, large blood, large leukocytosis and patient noted to have a elevated WBC.  Urine cultures with multiple species.  Blood cultures pending.  Repeat UA with cultures and sensitivities.  Continue IV Rocephin.  Urology following.  Follow.    3.  Possible aspiration pneumonitis Patient noted to have a witnessed aspiration event on the morning of admission while trying to drink fluids.  The  night of 06/23/2020 patient with an episode of aspiration with pills.  Patient seen by speech therapy.  Continue current diet as recommended by speech therapy.  Leukocytosis trending back up.  DC IV Rocephin placed on IV Unasyn.  Supportive care.  Follow.  4.  Well-controlled diabetes mellitus type  2 Hemoglobin A1c 6.0.  Patient noted to be on insulin prior to admission.  CBG of 169 this morning.  Patient noted to have elevated CBGs the evening of 06/24/2020 subsequently started on low-dose Lantus 5 units daily which will increase to 5 units twice daily as patient also on D5W.  Continue sliding scale insulin.   Follow.  5.  Parkinson's Stable.  Continue home regimen of medications.   6.  Osteoarthritis Status post prior knee surgery.  Patient seen by PT/ OT who are recommending home health therapies.  7.  Hyperlipidemia Continue statin.  8.  History of fibroma of the prostate Follows urology at [facility] last seen on 06/16/2020.  Continue current home regimen.  Outpatient follow-up.  9.  Hypocalcemia secondary to vitamin D deficiency Likely secondary to malnutrition and vitamin D deficiency.  Vitamin D 25 hydroxy at < 4.20.  Patient also noted to be hypomagnesium and hypophosphatemic.  Magnesium at 2.5.  Phosphorus at 2.9.  Replace magnesium and keep magnesium greater than 2.  We will give a dose of IV calcium gluconate.  Continue oral calcium supplementation.  Outpatient follow-up.  10.-Iron deficiency anemia/B12 deficiency Anemia panel concerning for B12 deficiency and iron deficiency anemia.  Status post 1 unit packed red blood cells (06/23/2020).  Hemoglobin currently at 8.4.  Transfusion threshold hemoglobin < 7.  Follow.  11.  Moderate protein calorie malnutrition Continue nutritional supplementation.  12.  Hypokalemia/hypomagnesemia Potassium currently at 4.0.  Magnesium at 2.5.  Follow.    13.  Constipation Noted on CT renal stone protocol.  Dulcolax suppository daily.  MiraLAX 17 g p.o. daily.  Senokot S2 twice daily.  14.  Hypernatremia Change IV fluids to D5W and follow.  Repeat labs this afternoon.  15.  Goals of care Patient presenting with decreased oral intake, noted to be in acute renal failure, with hydronephrosis bilateral.  Patient has been assessed by urology and  status post ureteroscopy with laser lithotripsy and stent placement(06/25/2020).  Foley catheter has been placed.  Patient seen in consultation by palliative care and hospice and once patient is medically stable likely discharge home with hospice following.  Appreciate palliative care input and recommendations.  DVT prophylaxis: SCDs Code Status: DNR Family Communication: Updated patient and wife at bedside. Disposition:   Status is: Inpatient    Dispo: The patient is from: Home              Anticipated d/c is to: Home with hospice following.              Anticipated d/c date is: 2 to 3 days.              Patient s/p urological procedure for left hydronephrosis with 1 cm mid left ureteral stone.  Patient also in acute renal failure.  Patient with a worsening leukocytosis, hyponatremic and currently not stable for discharge.        Consultants:   Dr. Arita Miss 06/23/2020  Palliative care: Mayra Reel, NP  Procedures:   Chest x-ray 06/23/2020, 06/24/2019  Renal ultrasound 06/21/2020  CT head 06/21/2020  CT stone protocol 06/24/2020  Cystoscopy/left ureteroscopy and stone removal/uteroscopic laser lithotripsy/urethral stent placement/left retrograde pyelogram 3 with interpretation per  Dr. Arita Miss 06/25/2020   Antimicrobials:   IV Unasyn 06/25/2020>>>> 06/25/2020  IV Rocephin 06/21/2020 >>>   IV Flagyl 06/25/2020   Subjective: Patient sitting up in recliner.  Denies any chest pain or shortness of breath.  No abdominal pain.  Per wife patient noted to have had a rough night, restless, unable to get any sleep.  Objective: Vitals:   06/25/20 1724 06/25/20 2106 06/26/20 0059 06/26/20 0519  BP: (!) 145/79 140/71 135/70 (!) 169/75  Pulse: 88 96 85 81  Resp: 19 19 19 19   Temp: 98.2 F (36.8 C) 98.1 F (36.7 C) 99.4 F (37.4 C) 98.3 F (36.8 C)  TempSrc: Oral  Oral   SpO2: 98% 98% 99% (!) 85%  Weight:      Height:        Intake/Output Summary (Last 24 hours) at  06/26/2020 1114 Last data filed at 06/26/2020 1016 Gross per 24 hour  Intake 240 ml  Output 2900 ml  Net -2660 ml   Filed Weights   06/21/20 1940 06/25/20 1345  Weight: 67.8 kg 69.4 kg    Examination:  General exam: NAD Respiratory system: CTAB.  No wheezes, no crackles, no rhonchi.  Normal respiratory effort.  Speaking in full sentences.  Cardiovascular system: Regular rate and rhythm no murmurs rubs or gallops.  No JVD.  No lower extremity edema.  Gastrointestinal system: Abdomen is soft, nontender, nondistended, positive bowel sounds.  No rebound.  No guarding.   Central nervous system: Alert. No focal neurological deficits. Extremities: Symmetric 5 x 5 power. Skin: No rashes, lesions or ulcers Psychiatry: Judgement and insight appear fair. Mood & affect appropriate.     Data Reviewed: I have personally reviewed following labs and imaging studies  CBC: Recent Labs  Lab 06/21/20 1312 06/21/20 1312 06/22/20 0407 06/23/20 0426 06/24/20 0334 06/25/20 0530 06/26/20 0434  WBC 13.7*   < > 14.4* 14.7* 12.5* 16.0* 19.5*  NEUTROABS 11.9*  --   --  12.8* 11.0* 13.8* 17.1*  HGB 8.2*   < > 7.9* 7.2* 7.6* 8.0* 8.4*  HCT 24.6*   < > 24.0* 22.5* 22.8* 23.2* 24.6*  MCV 101.7*   < > 102.6* 105.6* 100.4* 96.7 98.8  PLT 167   < > 164 158 160 189 205   < > = values in this interval not displayed.    Basic Metabolic Panel: Recent Labs  Lab 06/22/20 0407 06/23/20 0421 06/23/20 0426 06/24/20 0334 06/25/20 0530 06/26/20 0434  NA 143  --  144 144 145 151*  K 4.4  --  4.4 3.5 3.0* 4.0  CL 121*  --  121* 120* 108 112*  CO2 8*  --  7* 12* 19* 22  GLUCOSE 83  --  192* 207* 296* 168*  BUN 82*  --  84* 87* 93* 83*  CREATININE 4.83*  --  5.05* 5.11* 4.84* 4.76*  CALCIUM 6.2*  --  6.6* 6.2* 5.9* 5.9*  MG  --   --   --  1.5* 1.6* 2.5*  PHOS  --  4.6  --  5.0* 3.1 2.9    GFR: Estimated Creatinine Clearance: 12.4 mL/min (A) (by C-G formula based on SCr of 4.76 mg/dL (H)).  Liver  Function Tests: Recent Labs  Lab 06/21/20 1312 06/22/20 0407 06/24/20 0334 06/25/20 0530 06/26/20 0434  AST 14* 11* 10* 22  --   ALT 8 7 5 10   --   ALKPHOS 179* 160* 122 138*  --   BILITOT 0.2* 0.4 0.7  0.7  --   PROT 6.6 6.1* 5.6* 6.0*  --   ALBUMIN 3.6 3.4* 2.8* 2.9* 2.8*    CBG: Recent Labs  Lab 06/25/20 1341 06/25/20 1556 06/25/20 1741 06/26/20 0056 06/26/20 0641  GLUCAP 311* 186* 173* 309* 169*     Recent Results (from the past 240 hour(s))  SARS Coronavirus 2 by RT PCR (hospital order, performed in [facility] hospital lab) Nasopharyngeal Nasopharyngeal Swab     Status: None   Collection Time: 06/21/20  3:20 PM   Specimen: Nasopharyngeal Swab  Result Value Ref Range Status   SARS Coronavirus 2 NEGATIVE NEGATIVE Final    Comment: (NOTE) SARS-CoV-2 target nucleic acids are NOT DETECTED.  The SARS-CoV-2 RNA is generally detectable in upper and lower respiratory specimens during the acute phase of infection. The lowest concentration of SARS-CoV-2 viral copies this assay can detect is 250 copies / mL. A negative result does not preclude SARS-CoV-2 infection and should not be used as the sole basis for treatment or other patient management decisions.  A negative result may occur with improper specimen collection / handling, submission of specimen other than nasopharyngeal swab, presence of viral mutation(s) within the areas targeted by this assay, and inadequate number of viral copies (<250 copies / mL). A negative result must be combined with clinical observations, patient history, and epidemiological information.  Fact Sheet for Patients:   BoilerBrush.com.cy  Fact Sheet for Healthcare Providers: https://pope.com/  This test is not yet approved or  cleared by the [place] FDA and has been authorized for detection and/or diagnosis of SARS-CoV-2 by FDA under an Emergency Use Authorization (EUA).  This EUA will  remain in effect (meaning this test can be used) for the duration of the COVID-19 declaration under Section 564(b)(1) of the Act, 21 U.S.C. section 360bbb-3(b)(1), unless the authorization is terminated or revoked sooner.  Performed at [facility], [address], [place], [place] [address]   Urine Culture     Status: Abnormal   Collection Time: 06/21/20  4:01 PM   Specimen: Urine, Random  Result Value Ref Range Status   Specimen Description   Final    URINE, RANDOM Performed at [facility], [address], [place], [place] [address]    Special Requests   Final    NONE Performed at [facility], [address] [place], [place] [address]    Culture MULTIPLE SPECIES PRESENT, SUGGEST RECOLLECTION (A)  Final   Report Status 06/22/2020 FINAL  Final  Culture, blood (routine x 2)     Status: None (Preliminary result)   Collection Time: 06/22/20  1:12 PM   Specimen: BLOOD  Result Value Ref Range Status   Specimen Description   Final    BLOOD LEFT HAND Performed at [facility], [address] [place], [place] [address]    Special Requests   Final    BOTTLES DRAWN AEROBIC ONLY Blood Culture adequate volume Performed at [facility], [address], [place], [place] [address]    Culture   Final    NO GROWTH 3 DAYS Performed at [facility], [address] [place], [place] [address]    Report Status PENDING  Incomplete  Culture, blood (routine x 2)     Status: None (Preliminary result)   Collection Time: 06/22/20  1:16 PM   Specimen: BLOOD  Result Value Ref Range Status   Specimen Description   Final    BLOOD  LEFT ANTECUBITAL Performed at [facility], [address] [place], [place] [address]    Special Requests   Final    BOTTLES DRAWN AEROBIC AND ANAEROBIC Blood Culture adequate volume Performed at [facility], [address] [place], [place] [address]    Culture   Final    NO GROWTH 3 DAYS Performed at [facility], [address] [place], [place] [address]    Report Status PENDING  Incomplete         Radiology Studies: DG C-Arm 1-60 Min-No Report  Result Date: 06/25/2020 Fluoroscopy was utilized by the requesting physician.  No radiographic interpretation.        Scheduled Meds: . sodium chloride   Intravenous Once  . bisacodyl  10 mg Rectal Daily  . calcium carbonate  400 mg of elemental calcium Oral TID  . Carbidopa-Levodopa ER  1.5 tablet Oral TID  . Chlorhexidine Gluconate Cloth  6 each Topical Daily  . donepezil  5 mg Oral QHS  . ferrous sulfate  325 mg Oral Q breakfast  . insulin aspart  0-5 Units Subcutaneous QHS  . insulin aspart  0-9 Units Subcutaneous Q6H  . insulin glargine  5 Units Subcutaneous Daily  . lipase/protease/amylase  36,000 Units Oral TID with meals  . magnesium oxide  400 mg Oral BID  . polyethylene glycol  17 g Oral Daily  . rOPINIRole  2 mg Oral QHS  . senna-docusate  1 tablet Oral BID  . venlafaxine XR  225 mg Oral Q breakfast  . vitamin B-12  1,000 mcg Oral Daily   Continuous Infusions: . cefTRIAXone (ROCEPHIN)  IV 2 g (06/25/20 1833)  . dextrose 75 mL/hr at 06/26/20 0817  . metronidazole 500 mg (06/26/20 [id number])     LOS: 5 days    Time spent: 35 minutes    Ramiro Harvestaniel Shandale Malak, MD [facility]   To contact the attending provider between 7A-7P or the covering provider during after hours 7P-7A, please log into the web site [email or web address] and access using universal [place] password for that web site. If you do not have the password, please call the hospital operator.  06/26/2020, 11:14 AM

## 2020-06-27 LAB — CBC WITH DIFFERENTIAL/PLATELET
Abs Immature Granulocytes: 0.31 10*3/uL — ABNORMAL HIGH (ref 0.00–0.07)
Basophils Absolute: 0 10*3/uL (ref 0.0–0.1)
Basophils Relative: 0 %
Eosinophils Absolute: 0.2 10*3/uL (ref 0.0–0.5)
Eosinophils Relative: 1 %
HCT: 21.4 % — ABNORMAL LOW (ref 39.0–52.0)
Hemoglobin: 7.2 g/dL — ABNORMAL LOW (ref 13.0–17.0)
Immature Granulocytes: 2 %
Lymphocytes Relative: 5 %
Lymphs Abs: 1 10*3/uL (ref 0.7–4.0)
MCH: 33.5 pg (ref 26.0–34.0)
MCHC: 33.6 g/dL (ref 30.0–36.0)
MCV: 99.5 fL (ref 80.0–100.0)
Monocytes Absolute: 1.2 10*3/uL — ABNORMAL HIGH (ref 0.1–1.0)
Monocytes Relative: 6 %
Neutro Abs: 16.7 10*3/uL — ABNORMAL HIGH (ref 1.7–7.7)
Neutrophils Relative %: 86 %
Platelets: 184 10*3/uL (ref 150–400)
RBC: 2.15 MIL/uL — ABNORMAL LOW (ref 4.22–5.81)
RDW: 14.2 % (ref 11.5–15.5)
WBC: 19.3 10*3/uL — ABNORMAL HIGH (ref 4.0–10.5)
nRBC: 0 % (ref 0.0–0.2)

## 2020-06-27 LAB — RENAL FUNCTION PANEL
Albumin: 2.6 g/dL — ABNORMAL LOW (ref 3.5–5.0)
Anion gap: 16 — ABNORMAL HIGH (ref 5–15)
BUN: 82 mg/dL — ABNORMAL HIGH (ref 8–23)
CO2: 21 mmol/L — ABNORMAL LOW (ref 22–32)
Calcium: 5.5 mg/dL — CL (ref 8.9–10.3)
Chloride: 105 mmol/L (ref 98–111)
Creatinine, Ser: 4.42 mg/dL — ABNORMAL HIGH (ref 0.61–1.24)
GFR calc Af Amer: 14 mL/min — ABNORMAL LOW (ref >60)
GFR calc non Af Amer: 12 mL/min — ABNORMAL LOW (ref >60)
Glucose, Bld: 214 mg/dL — ABNORMAL HIGH (ref 70–99)
Phosphorus: 3.3 mg/dL (ref 2.5–4.6)
Potassium: 3.4 mmol/L — ABNORMAL LOW (ref 3.5–5.1)
Sodium: 142 mmol/L (ref 135–145)

## 2020-06-27 LAB — GLUCOSE, CAPILLARY
Glucose-Capillary: 149 mg/dL — ABNORMAL HIGH (ref 70–99)
Glucose-Capillary: 151 mg/dL — ABNORMAL HIGH (ref 70–99)
Glucose-Capillary: 160 mg/dL — ABNORMAL HIGH (ref 70–99)
Glucose-Capillary: 177 mg/dL — ABNORMAL HIGH (ref 70–99)
Glucose-Capillary: 194 mg/dL — ABNORMAL HIGH (ref 70–99)
Glucose-Capillary: 264 mg/dL — ABNORMAL HIGH (ref 70–99)

## 2020-06-27 LAB — CULTURE, BLOOD (ROUTINE X 2)
Culture: NO GROWTH
Culture: NO GROWTH
Special Requests: ADEQUATE
Special Requests: ADEQUATE

## 2020-06-27 LAB — URINE CULTURE: Culture: NO GROWTH

## 2020-06-27 MED ORDER — SODIUM CHLORIDE 0.45 % IV SOLN: INTRAVENOUS | Status: DC

## 2020-06-27 MED ORDER — CALCIUM GLUCONATE-NACL 2-0.675 GM/100ML-% IV SOLN
2.0000 g | Freq: Once | INTRAVENOUS | Status: AC
  Administered 2020-06-27: 2000 mg via INTRAVENOUS
  Filled 2020-06-27: qty 100

## 2020-06-27 MED ORDER — POTASSIUM CHLORIDE CRYS ER 20 MEQ PO TBCR
20.0000 meq | EXTENDED_RELEASE_TABLET | Freq: Once | ORAL | Status: AC
  Administered 2020-06-27: 20 meq via ORAL
  Filled 2020-06-27: qty 1

## 2020-06-27 MED ORDER — CALCIUM CARBONATE-VITAMIN D 500-200 MG-UNIT PO TABS
2.0000 | ORAL_TABLET | Freq: Three times a day (TID) | ORAL | Status: DC
  Administered 2020-06-27 – 2020-06-29 (×8): 2 via ORAL
  Filled 2020-06-27 (×8): qty 2

## 2020-06-27 MED ORDER — INSULIN ASPART 100 UNIT/ML ~~LOC~~ SOLN
0.0000 [IU] | Freq: Three times a day (TID) | SUBCUTANEOUS | Status: DC
  Administered 2020-06-27 – 2020-06-28 (×3): 2 [IU] via SUBCUTANEOUS
  Administered 2020-06-28: 3 [IU] via SUBCUTANEOUS
  Administered 2020-06-29 (×2): 5 [IU] via SUBCUTANEOUS

## 2020-06-27 NOTE — TOC Progression Note (Signed)
Transition of Care [facility]) - Progression Note    Patient Details  Name: Randall [name] MRN: [id number] Date of Birth: 09/27/1941  [facility] CM/SW Contact  Geni Bers, RN Phone Number: 06/27/2020, 12:45 PM  Clinical Narrative:    Pt from home with [facility]. Per Chrislyn, RN with [facility], DME is ordered and everything is set for pt at home. [facility] will follow for discharge.    Expected Discharge Plan: Home w Hospice Care Barriers to Discharge: No Barriers Identified  Expected Discharge Plan and Services Expected Discharge Plan: Home w Hospice Care In-house Referral: Hospice / Palliative Care Discharge Planning Services: CM Consult   Living arrangements for the past 2 months: Single Family Home                 DME Arranged:  [facility] to Arrange DME needs-Shower chair w/ rails)   Date DME Agency Contacted: 06/23/20 Time DME Agency Contacted: 1053 Representative spoke with at DME Agency: Eben Burow   [facility] Agency: [facility] Date [facility] Agency Contacted: 06/23/20 Time HH Agency Contacted: 1053 Representative spoke with at [facility] Agency: Eben Burow   Social Determinants of Health (SDOH) Interventions    Readmission Risk Interventions No flowsheet data found.

## 2020-06-27 NOTE — Progress Notes (Signed)
OT Cancellation Note  Patient Details Name: WILBURN KEIR MRN: [id number] DOB: 19-Oct-1941   Cancelled Treatment:    Reason Eval/Treat Not Completed: Other (comment);Fatigue/lethargy limiting ability to participate patient and family declined therapy today stating patient feeling poorly, fatigued and recently up to bathroom with BM. Will continue to follow patient.  Javonta Gronau L Subrena Devereux 06/27/2020, 3:16 PM

## 2020-06-27 NOTE — Progress Notes (Signed)
PROGRESS NOTE    Randall [name]  WUJ:[id number] DOB: [birth year] DOA: 06/21/2020 PCP: Gaspar Garbeisovec, Richard W, MD    Chief Complaint  Patient presents with  . Weakness    Brief Narrative:  Randall [name] a 79 y.o.malewith medical history significant ofparkinson's disease, DM2, arthritis who presented to the ED with concerns of increased weakness and poor PO intake.  Pt apparently in his usual state of health until 5 days prior to admit when he lost his appetite and was taking minimal PO.  He was losing strength and became unable to stand with assistance the day prior to admit.  Concern for aspiration events.  He was admitted with AKI and concern for UTI.  Renal US shows bilateral hydro and he's now s/p foley placement.  Wife had been looking into hospice prior to admission and palliative care was c/s and palliative care is following.  Renal CT with moderate left hydronephrosis with perinephric stranding secondary to 1 cm proximal left ureteral calculus, large amount of stool seen in the sigmoid colon and rectum.  Patient has been assessed by urology and patient to undergo cystoscopy with left retrograde pyelogram ureteroscopy with laser and left ureteral stent placement today 06/25/2020. Dispo is pending improvement in renal function/overall status.         Assessment & Plan:   Principal Problem:   ARF (acute renal failure) (HCC) Active Problems:   Diabetes (HCC)   Parkinson disease (HCC)   S/P total knee arthroplasty   Hyperlipidemia   Benign fibroma of prostate   Acute lower UTI   DNR (do not resuscitate)   Hypokalemia   Hyperphosphatemia   Metabolic acidosis, increased anion gap   Hypomagnesemia   Palliative care by specialist   Hydronephrosis with renal and ureteral calculus obstruction   Hypocalcemia   Hypernatremia  1 acute renal failure/bilateral hydronephrosis/anion gap metabolic acidosis Patient had presented with increased weakness, decreased oral intake noted to  be in acute renal failure with a creatinine on admission of 4.76 which went up as high as 5.11.  Last creatinine noted on epic was 0.62 on 03/10/2014.  Baseline creatinine per Dr. Lowell GuitarPowell was 1.39 in 2020.  Renal ultrasound on 06/21/2020 with mild right and moderate left hydronephrosis status post Foley catheter placement.  Follow-up CT renal stone protocol 06/24/2020 with moderate left hydronephrosis noted with perinephric stranding, secondary to 1 cm proximal left ureteral calculus, minimal bilateral pleural effusions noted with adjacent subsegmental atelectasis.  Calcifications noted throughout the pancreas consistent with chronic pancreatitis.  Large amount of stool seen in the sigmoid colon and rectum.  Creatinine currently at 4.42.  Urine output of 2.8 L over the past 24 hours.  Patient seen by urology and patient s/p ureteroscopy with laser lithotripsy and stent placement on 06/25/2020.  Patient with a worsening leukocytosis which seems to be stabilized and likely secondary to recent instrumentation.  Repeat urine cultures negative. Continue IV Rocephin, IV fluids.  DC bicarb drip.  Anion gap metabolic acidosis secondary to progressive renal failure which has improved.  Urology following and appreciate their input and recommendations.  2.  UTI Urinalysis concerning for UTI with > 50 WBCs, many bacteria, large blood, large leukocytosis and patient noted to have a elevated WBC.  Urine cultures with multiple species.  Blood cultures pending.  Repeat urine cultures negative.  Continue IV Rocephin.  Urology following.  Follow.    3.  Possible aspiration pneumonitis Patient noted to have a witnessed aspiration event on the morning of admission  while trying to drink fluids.  The night of 06/23/2020 patient with an episode of aspiration with pills.  Patient seen by speech therapy.  Continue current diet as recommended by speech therapy.  Leukocytosis trending back up and currently stable over the past 24 to 48 hours.   Patient was briefly changed from IV Rocephin to IV Unasyn and subsequently changed back to IV Rocephin and IV Flagyl added.  Could likely transition to oral Flagyl tomorrow.  Continue supportive care.  Follow.  4.  Well-controlled diabetes mellitus type 2 Hemoglobin A1c 6.0.  Patient noted to be on insulin prior to admission.  CBG of 177 this morning.  Patient noted to have elevated CBGs (200-300s) the evening of 06/26/2020.  Elevated CBGs in part secondary to D5W that patient was placed on.  Change IV fluids to half-normal saline.  Continue current dose Lantus 5 units twice daily.  Sliding scale insulin.  Follow.   5.  Parkinson's Continue home regimen of medications.  Stable.   6.  Osteoarthritis Status post prior knee surgery.  Patient seen by PT/ OT who are recommending home health therapies.  7.  Hyperlipidemia Continue statin.  8.  History of fibroma of the prostate Follows urology at [facility] last seen on 06/16/2020.  Continue current home regimen.  Outpatient follow-up.  9.  Hypocalcemia secondary to vitamin D deficiency Likely secondary to malnutrition and vitamin D deficiency.  Vitamin D 25 hydroxy at < 4.20.  Patient also noted to be hypomagnesium and hypophosphatemic.  Magnesium at 2.5.  Phosphorus at 3.3.  Replace magnesium and keep magnesium greater than 2.  Patient receiving 2 g IV calcium gluconate.  Change calcium carbonate to Os-Cal with vitamin D 2 tablets 3 times daily.  Outpatient follow-up.   10.-Iron deficiency anemia/B12 deficiency Anemia panel concerning for B12 deficiency and iron deficiency anemia.  Status post 1 unit packed red blood cells (06/23/2020).  Hemoglobin currently at 7.2.  Transfusion threshold hemoglobin < 7.  Follow.  11.  Moderate protein calorie malnutrition Continue nutritional supplementation.  12.  Hypokalemia/hypomagnesemia/hypocalcemia Potassium currently at 3.4.  K. Dur 20 mEq p.o. x1.  Magnesium at 2.5.  Patient receiving 2 g of IV calcium  gluconate.  Change calcium carbonate to Os-Cal with vitamin D 2 tablets 3 times daily.  Follow.    13.  Constipation Noted on CT renal stone protocol.  Dulcolax suppository daily.  MiraLAX 17 g p.o. daily.  Senokot S2 twice daily.  14.  Hypernatremia Improved on D5W.  Change IV fluids to half-normal saline as CBGs seem to be elevated.    15.  Goals of care Patient presenting with decreased oral intake, noted to be in acute renal failure, with hydronephrosis bilateral.  Patient has been assessed by urology and status post ureteroscopy with laser lithotripsy and stent placement(06/25/2020).  Foley catheter has been placed.  Patient seen in consultation by palliative care and hospice and once patient is medically stable likely discharge home with hospice following.  Appreciate palliative care input and recommendations.  DVT prophylaxis: SCDs Code Status: DNR Family Communication: Updated patient and wife and daughter at bedside. Disposition:   Status is: Inpatient    Dispo: The patient is from: Home              Anticipated d/c is to: Home with hospice following.              Anticipated d/c date is: 2 to 3 days.  Patient s/p urological procedure for left hydronephrosis with 1 cm mid left ureteral stone.  Patient also in acute renal failure.  Patient with a worsening leukocytosis, hypernatremic which is improving, hypocalcemic and currently not stable for discharge.        Consultants:   Dr. Arita Miss 06/23/2020  Palliative care: Mayra Reel, NP  Procedures:   Chest x-ray 06/23/2020, 06/24/2019  Renal ultrasound 06/21/2020  CT head 06/21/2020  CT stone protocol 06/24/2020  Cystoscopy/left ureteroscopy and stone removal/uteroscopic laser lithotripsy/urethral stent placement/left retrograde pyelogram 3 with interpretation per Dr. Arita Miss 06/25/2020   Antimicrobials:   IV Unasyn 06/25/2020>>>> 06/25/2020  IV Rocephin 06/21/2020 >>>   IV Flagyl  06/25/2020   Subjective: Patient laying in bed.  Was able to sleep better overnight per wife and per patient.  Denies chest pain.  No shortness of breath.   Objective: Vitals:   06/26/20 1120 06/26/20 1304 06/26/20 2120 06/27/20 0541  BP:  (!) 141/75 128/68 (!) 140/71  Pulse:  85 77 78  Resp:  Temp:  98.7 F (37.1 C) (!) 97.3 F (36.3 C) 97.7 F (36.5 C)  TempSrc:  Oral Oral Oral  SpO2: 98% 100% 98% 97%  Weight:      Height:        Intake/Output Summary (Last 24 hours) at 06/27/2020 1124 Last data filed at 06/27/2020 0445 Gross per 24 hour  Intake 2378.86 ml  Output 2275 ml  Net 103.86 ml   Filed Weights   06/21/20 1940 06/25/20 1345  Weight: 67.8 kg 69.4 kg    Examination:  General exam: NAD Respiratory system: Lungs clear to auscultation bilaterally.  No wheezes, no crackles, no rhonchi.  Normal respiratory effort.  Speaking in full sentences.  Cardiovascular system: RRR no murmurs rubs or gallops.  No JVD.  No lower extremity edema.  Gastrointestinal system: Abdomen is nontender, nondistended, soft, positive bowel sounds.  No rebound.  No guarding.   Central nervous system: Alert. No focal neurological deficits. Extremities: Symmetric 5 x 5 power. Skin: No rashes, lesions or ulcers Psychiatry: Judgement and insight appear fair. Mood & affect appropriate.     Data Reviewed: I have personally reviewed following labs and imaging studies  CBC: Recent Labs  Lab 06/23/20 0426 06/24/20 0334 06/25/20 0530 06/26/20 0434 06/27/20 0415  WBC 14.7* 12.5* 16.0* 19.5* 19.3*  NEUTROABS 12.8* 11.0* 13.8* 17.1* 16.7*  HGB 7.2* 7.6* 8.0* 8.4* 7.2*  HCT 22.5* 22.8* 23.2* 24.6* 21.4*  MCV 105.6* 100.4* 96.7 98.8 99.5  PLT 158 160 189 205 184    Basic Metabolic Panel: Recent Labs  Lab 06/23/20 0421 06/23/20 0426 06/24/20 0334 06/25/20 0530 06/26/20 0430 06/26/20 0434 06/27/20 0415  NA  --    < > 144 145 150* 151* 142  K  --    < > 3.5 3.0* 4.0 4.0 3.4*   CL  --    < > 120* 108 112* 112* 105  CO2  --    < > 12* 19* 21* 22 21*  GLUCOSE  --    < > 207* 296* 152* 168* 214*  BUN  --    < > 87* 93* 82* 83* 82*  CREATININE  --    < > 5.11* 4.84* 4.70* 4.76* 4.42*  CALCIUM  --    < > 6.2* 5.9* 5.7* 5.9* 5.5*  MG  --   --  1.5* 1.6*  --  2.5*  --   PHOS 4.6  --  5.0* 3.1  --  2.9 3.3   < > = values in this interval not displayed.    GFR: Estimated Creatinine Clearance: 13.3 mL/min (A) (by C-G formula based on SCr of 4.42 mg/dL (H)).  Liver Function Tests: Recent Labs  Lab 06/21/20 1312 06/21/20 1312 06/22/20 0407 06/24/20 0334 06/25/20 0530 06/26/20 0434 06/27/20 0415  AST 14*  --  11* 10* 22  --   --   ALT 8  --  7 5 10   --   --   ALKPHOS 179*  --  160* 122 138*  --   --   BILITOT 0.2*  --  0.4 0.7 0.7  --   --   PROT 6.6  --  6.1* 5.6* 6.0*  --   --   ALBUMIN 3.6   < > 3.4* 2.8* 2.9* 2.8* 2.6*   < > = values in this interval not displayed.    CBG: Recent Labs  Lab 06/26/20 1159 06/26/20 1731 06/27/20 0020 06/27/20 0543 06/27/20 0803  GLUCAP 342* 323* 264* 177* 149*     Recent Results (from the past 240 hour(s))  SARS Coronavirus 2 by RT PCR (hospital order, performed in [facility] hospital lab) Nasopharyngeal Nasopharyngeal Swab     Status: None   Collection Time: 06/21/20  3:20 PM   Specimen: Nasopharyngeal Swab  Result Value Ref Range Status   SARS Coronavirus 2 NEGATIVE NEGATIVE Final    Comment: (NOTE) SARS-CoV-2 target nucleic acids are NOT DETECTED.  The SARS-CoV-2 RNA is generally detectable in upper and lower respiratory specimens during the acute phase of infection. The lowest concentration of SARS-CoV-2 viral copies this assay can detect is 250 copies / mL. A negative result does not preclude SARS-CoV-2 infection and should not be used as the sole basis for treatment or other patient management decisions.  A negative result may occur with improper specimen collection / handling, submission of specimen  other than nasopharyngeal swab, presence of viral mutation(s) within the areas targeted by this assay, and inadequate number of viral copies (<250 copies / mL). A negative result must be combined with clinical observations, patient history, and epidemiological information.  Fact Sheet for Patients:   06/23/20  Fact Sheet for Healthcare Providers: BoilerBrush.com.cy  This test is not yet approved or  cleared by the https://pope.com/ FDA and has been authorized for detection and/or diagnosis of SARS-CoV-2 by FDA under an Emergency Use Authorization (EUA).  This EUA will remain in effect (meaning this test can be used) for the duration of the COVID-19 declaration under Section 564(b)(1) of the Act, 21 U.S.C. section 360bbb-3(b)(1), unless the authorization is terminated or revoked sooner.  Performed at [facility], [address], [place], [place]   Urine Culture     Status: Abnormal   Collection Time: 06/21/20  4:01 PM   Specimen: Urine, Random  Result Value Ref Range Status   Specimen Description   Final    URINE, RANDOM Performed at [facility], [address], [place], [place]    Special Requests   Final    NONE Performed at [facility], [address], [place], [place]    Culture MULTIPLE SPECIES PRESENT, SUGGEST RECOLLECTION (A)  Final   Report Status 06/22/2020 FINAL  Final  Culture, blood (routine x 2)     Status: None   Collection Time: 06/22/20  1:12 PM   Specimen: BLOOD  Result Value Ref Range Status   Specimen Description  Final    BLOOD LEFT HAND Performed at [facility], [address] [place], [place] [address]    Special Requests   Final    BOTTLES DRAWN AEROBIC ONLY Blood Culture adequate volume Performed at [facility], [address] [place], [place] [address]    Culture    Final    NO GROWTH 5 DAYS Performed at [facility], [address], [place], [place] [address]    Report Status 06/27/2020 FINAL  Final  Culture, blood (routine x 2)     Status: None   Collection Time: 06/22/20  1:16 PM   Specimen: BLOOD  Result Value Ref Range Status   Specimen Description   Final    BLOOD LEFT ANTECUBITAL Performed at [facility], [address], [place], [place] [address]    Special Requests   Final    BOTTLES DRAWN AEROBIC AND ANAEROBIC Blood Culture adequate volume Performed at [facility], [address], [place], [place] [address]    Culture   Final    NO GROWTH 5 DAYS Performed at [facility], [address], [place], [place] [address]    Report Status 06/27/2020 FINAL  Final  Culture, Urine     Status: None   Collection Time: 06/26/20  8:19 AM   Specimen: Urine, Catheterized  Result Value Ref Range Status   Specimen Description   Final    URINE, CATHETERIZED Performed at [facility], [address], [place], [place] [address]    Special Requests   Final    NONE Performed at [facility], [address], [place], [place] [address]    Culture   Final    NO GROWTH Performed at [facility], [address], [place], [place] [address]    Report Status 06/27/2020 FINAL  Final         Radiology Studies: DG C-Arm 1-60 Min-No Report  Result Date: 06/25/2020 Fluoroscopy was utilized by the requesting physician.  No radiographic interpretation.        Scheduled Meds: . sodium chloride   Intravenous Once  . bisacodyl  10 mg Rectal Daily  . calcium-vitamin D  2 tablet Oral TID  . Carbidopa-Levodopa ER  1.5 tablet Oral TID  . Chlorhexidine Gluconate Cloth  6 each Topical Daily  . donepezil  5 mg Oral QHS  . ferrous sulfate  325 mg Oral Q breakfast  . insulin aspart  0-5 Units Subcutaneous QHS  . insulin aspart  0-9 Units Subcutaneous Q6H  .  insulin glargine  5 Units Subcutaneous BID  . lipase/protease/amylase  36,000 Units Oral TID with meals  . magnesium oxide  400 mg Oral BID  . polyethylene glycol  17 g Oral Daily  . rOPINIRole  2 mg Oral QHS  . senna-docusate  1 tablet Oral BID  . venlafaxine XR  225 mg Oral Q breakfast  . vitamin B-12  1,000 mcg Oral Daily   Continuous Infusions: . cefTRIAXone (ROCEPHIN)  IV 2 g (06/26/20 1954)  . dextrose 100 mL/hr at 06/27/20 0027  . metronidazole 500 mg (06/27/20 0518)     LOS: 6 days    Time spent: 35 minutes    Ramiro Harvest, MD [facility]   To contact the attending provider between 7A-7P or the covering provider during after hours 7P-7A, please log into the web site [email or web address] and access using universal [place] password for that  web site. If you do not have the password, please call the hospital operator.  06/27/2020, 11:24 AM

## 2020-06-28 LAB — RENAL FUNCTION PANEL
Albumin: 2.8 g/dL — ABNORMAL LOW (ref 3.5–5.0)
Anion gap: 12 (ref 5–15)
BUN: 68 mg/dL — ABNORMAL HIGH (ref 8–23)
CO2: 23 mmol/L (ref 22–32)
Calcium: 5.7 mg/dL — CL (ref 8.9–10.3)
Chloride: 102 mmol/L (ref 98–111)
Creatinine, Ser: 3.81 mg/dL — ABNORMAL HIGH (ref 0.61–1.24)
GFR calc Af Amer: 16 mL/min — ABNORMAL LOW (ref >60)
GFR calc non Af Amer: 14 mL/min — ABNORMAL LOW (ref >60)
Glucose, Bld: 109 mg/dL — ABNORMAL HIGH (ref 70–99)
Phosphorus: 2.8 mg/dL (ref 2.5–4.6)
Potassium: 3.3 mmol/L — ABNORMAL LOW (ref 3.5–5.1)
Sodium: 137 mmol/L (ref 135–145)

## 2020-06-28 LAB — CBC WITH DIFFERENTIAL/PLATELET
Abs Immature Granulocytes: 0.58 10*3/uL — ABNORMAL HIGH (ref 0.00–0.07)
Basophils Absolute: 0 10*3/uL (ref 0.0–0.1)
Basophils Relative: 0 %
Eosinophils Absolute: 0.2 10*3/uL (ref 0.0–0.5)
Eosinophils Relative: 1 %
HCT: 23.7 % — ABNORMAL LOW (ref 39.0–52.0)
Hemoglobin: 8 g/dL — ABNORMAL LOW (ref 13.0–17.0)
Immature Granulocytes: 3 %
Lymphocytes Relative: 7 %
Lymphs Abs: 1.3 10*3/uL (ref 0.7–4.0)
MCH: 33.3 pg (ref 26.0–34.0)
MCHC: 33.8 g/dL (ref 30.0–36.0)
MCV: 98.8 fL (ref 80.0–100.0)
Monocytes Absolute: 1 10*3/uL (ref 0.1–1.0)
Monocytes Relative: 5 %
Neutro Abs: 16.7 10*3/uL — ABNORMAL HIGH (ref 1.7–7.7)
Neutrophils Relative %: 84 %
Platelets: 199 10*3/uL (ref 150–400)
RBC: 2.4 MIL/uL — ABNORMAL LOW (ref 4.22–5.81)
RDW: 14.2 % (ref 11.5–15.5)
WBC: 19.9 10*3/uL — ABNORMAL HIGH (ref 4.0–10.5)
nRBC: 0 % (ref 0.0–0.2)

## 2020-06-28 LAB — GLUCOSE, CAPILLARY
Glucose-Capillary: 110 mg/dL — ABNORMAL HIGH (ref 70–99)
Glucose-Capillary: 158 mg/dL — ABNORMAL HIGH (ref 70–99)
Glucose-Capillary: 225 mg/dL — ABNORMAL HIGH (ref 70–99)
Glucose-Capillary: 190 mg/dL — ABNORMAL HIGH (ref 70–99)

## 2020-06-28 LAB — MAGNESIUM: Magnesium: 2 mg/dL (ref 1.7–2.4)

## 2020-06-28 MED ORDER — CALCIUM GLUCONATE-NACL 2-0.675 GM/100ML-% IV SOLN
2.0000 g | Freq: Once | INTRAVENOUS | Status: AC
  Administered 2020-06-28: 2000 mg via INTRAVENOUS
  Filled 2020-06-28: qty 100

## 2020-06-28 MED ORDER — QUETIAPINE FUMARATE 25 MG PO TABS
25.0000 mg | ORAL_TABLET | Freq: Every day | ORAL | Status: DC
  Administered 2020-06-28: 25 mg via ORAL
  Filled 2020-06-28: qty 1

## 2020-06-28 MED ORDER — SODIUM CHLORIDE 0.9 % IV SOLN: INTRAVENOUS | Status: DC

## 2020-06-28 MED ORDER — TEMAZEPAM 15 MG PO CAPS: 15.0000 mg | ORAL_CAPSULE | Freq: Every evening | ORAL | Status: DC | PRN

## 2020-06-28 MED ORDER — POTASSIUM CHLORIDE CRYS ER 20 MEQ PO TBCR
40.0000 meq | EXTENDED_RELEASE_TABLET | Freq: Once | ORAL | Status: AC
  Administered 2020-06-28: 40 meq via ORAL
  Filled 2020-06-28: qty 2

## 2020-06-28 MED ORDER — METRONIDAZOLE 500 MG PO TABS
500.0000 mg | ORAL_TABLET | Freq: Three times a day (TID) | ORAL | Status: DC
  Administered 2020-06-28 – 2020-06-29 (×5): 500 mg via ORAL
  Filled 2020-06-28 (×3): qty 1

## 2020-06-28 NOTE — Progress Notes (Signed)
Civil engineer, contracting [facility])  Hospital liaison spoke patient and spouse [place] ("Ginger") at bedside to continue education on hospice services and to answer any questions at this time. Both verbalized understanding of information given.  Patient requests more time to consider palliative vs hospice services in home.  Date of discharge unclear at this time.  Pt shared that chief complain at this time is fatigue, reports poor sleep x 3-4 nights with ordered ativan 0.5 mg being ineffective.  Ginger shared that fatigue has affected his speech and motor.  This was shared with Dr. Janee Morn.  [facility] information and contact numbers given to Ginger.  Please call with any questions or concerns.  Thank you for the opportunity to participate in this pt's care.  Gillian Scarce, BSN, RN [employer] [phone] (24h on call)

## 2020-06-28 NOTE — Progress Notes (Signed)
PROGRESS NOTE    Randall [name]  ZOX:[id number] DOB: Nov 11, 1941 DOA: 06/21/2020 PCP: Gaspar Garbeisovec, Richard W, MD    Chief Complaint  Patient presents with  . Weakness    Brief Narrative:  Randall [name] a 79 y.o.malewith medical history significant ofparkinson's disease, DM2, arthritis who presented to the ED with concerns of increased weakness and poor PO intake.  Pt apparently in his usual state of health until 5 days prior to admit when he lost his appetite and was taking minimal PO.  He was losing strength and became unable to stand with assistance the day prior to admit.  Concern for aspiration events.  He was admitted with AKI and concern for UTI.  Renal US shows bilateral hydro and he's now s/p foley placement.  Wife had been looking into hospice prior to admission and palliative care was c/s and palliative care is following.  Renal CT with moderate left hydronephrosis with perinephric stranding secondary to 1 cm proximal left ureteral calculus, large amount of stool seen in the sigmoid colon and rectum.  Patient has been assessed by urology and patient to undergo cystoscopy with left retrograde pyelogram ureteroscopy with laser and left ureteral stent placement today 06/25/2020. Dispo is pending improvement in renal function/overall status.         Assessment & Plan:   Principal Problem:   ARF (acute renal failure) (HCC) Active Problems:   Diabetes (HCC)   Parkinson disease (HCC)   S/P total knee arthroplasty   Hyperlipidemia   Benign fibroma of prostate   Acute lower UTI   DNR (do not resuscitate)   Hypokalemia   Hyperphosphatemia   Metabolic acidosis, increased anion gap   Hypomagnesemia   Palliative care by specialist   Hydronephrosis with renal and ureteral calculus obstruction   Hypocalcemia   Hypernatremia  1 acute renal failure/bilateral hydronephrosis/anion gap metabolic acidosis Patient had presented with increased weakness, decreased oral intake noted to  be in acute renal failure with a creatinine on admission of 4.76 which went up as high as 5.11.  Last creatinine noted on epic was 0.62 on 03/10/2014.  Baseline creatinine per Dr. Lowell GuitarPowell was 1.39 in 2020.  Renal ultrasound on 06/21/2020 with mild right and moderate left hydronephrosis status post Foley catheter placement.  Follow-up CT renal stone protocol 06/24/2020 with moderate left hydronephrosis noted with perinephric stranding, secondary to 1 cm proximal left ureteral calculus, minimal bilateral pleural effusions noted with adjacent subsegmental atelectasis.  Calcifications noted throughout the pancreas consistent with chronic pancreatitis.  Large amount of stool seen in the sigmoid colon and rectum.  Creatinine currently at 3.81.  Urine output of 2.4 L over the past 24 hours.  Patient seen by urology and patient s/p ureteroscopy with laser lithotripsy and stent placement on 06/25/2020.  Patient with a worsening leukocytosis which seems to be stabilized and likely secondary to recent instrumentation.  Repeat urine cultures negative. Continue IV Rocephin, IV fluids.  DC bicarb drip.  Anion gap metabolic acidosis secondary to progressive renal failure which has improved.  Urology following and appreciate their input and recommendations.  2.  UTI Urinalysis concerning for UTI with > 50 WBCs, many bacteria, large blood, large leukocytosis and patient noted to have a elevated WBC.  Urine cultures with multiple species.  Blood cultures pending.  Repeat urine cultures negative.  Continue IV Rocephin.  Urology following.  Follow.    3.  Possible aspiration pneumonitis Patient noted to have a witnessed aspiration event on the morning of admission  while trying to drink fluids.  The night of 06/23/2020 patient with an episode of aspiration with pills.  Patient seen by speech therapy.  Continue current diet as recommended by speech therapy.  Leukocytosis trending back up and currently stable over the past 24 to 48 hours.   Patient was briefly changed from IV Rocephin to IV Unasyn and subsequently changed back to IV Rocephin and IV Flagyl added.  Change IV Flagyl to oral Flagyl.  Continue IV Rocephin.  Supportive care.   4.  Well-controlled diabetes mellitus type 2 Hemoglobin A1c 6.0.  Patient noted to be on insulin prior to admission.  CBG of 110 this morning.  Patient noted to have elevated CBGs (200-300s) the evening of 06/26/2020.  Elevated CBGs in part secondary to D5W that patient was placed on.  IV fluids have been changed and blood glucose levels have improved.  Continue current dose of Lantus 5 units twice daily.  Sliding scale insulin.    5.  Parkinson's Continue home regimen of medications.  Stable.   6.  Osteoarthritis Status post prior knee surgery.  Patient seen by PT/ OT who are recommending home health therapies.  7.  Hyperlipidemia Continue statin.  8.  History of fibroma of the prostate Follows urology at [facility] last seen on 06/16/2020.  Continue current home regimen.  Outpatient follow-up.  9.  Hypocalcemia secondary to vitamin D deficiency Likely secondary to malnutrition and vitamin D deficiency.  Vitamin D 25 hydroxy at < 4.20.  Patient also noted to be hypomagnesium and hypophosphatemic.  Magnesium at 2.5.  Phosphorus at 3.3.  Replaced magnesium and keep magnesium > 2.  We will give 2 g IV calcium gluconate x1.  Continue Os-Cal with vitamin D2 tablets 3 times daily.  Outpatient follow-up.   10.-Iron deficiency anemia/B12 deficiency Anemia panel concerning for B12 deficiency and iron deficiency anemia.  Status post 1 unit packed red blood cells (06/23/2020).  Hemoglobin currently at 8.  Transfusion threshold hemoglobin < 7.  Follow.  11.  Moderate protein calorie malnutrition Continue nutritional supplementation.  12.  Hypokalemia/hypomagnesemia/hypocalcemia Potassium currently at 3.3.  K. Dur 40 mEq p.o. x1.  2 g IV calcium gluconate x1.  Continue Os-Cal with vitamin D 2 tablets 3 times  daily.  Follow.   13.  Constipation Noted on CT renal stone protocol.  Dulcolax suppository daily.  MiraLAX 17 g p.o. daily.  Senokot S2 twice daily.  14.  Hypernatremia Improved on D5W.  IV fluids changed to half-normal saline which we will change to normal saline.  CBGs improved.  Follow.   15.  Goals of care Patient presenting with decreased oral intake, noted to be in acute renal failure, with hydronephrosis bilateral.  Patient has been assessed by urology and status post ureteroscopy with laser lithotripsy and stent placement(06/25/2020).  Foley catheter has been placed.  Patient seen in consultation by palliative care and hospice and once patient is medically stable likely discharge home with hospice following.  Appreciate palliative care input and recommendations.  DVT prophylaxis: SCDs Code Status: DNR Family Communication: Updated patient and wife at bedside. Disposition:   Status is: Inpatient    Dispo: The patient is from: Home              Anticipated d/c is to: Home with hospice following.              Anticipated d/c date is: 1-2 days.              Patient s/p  urological procedure for left hydronephrosis with 1 cm mid left ureteral stone.  Patient also in acute renal failure.  Patient with a worsening leukocytosis which seems to be plateauing, hypernatremic which is improving, hypocalcemic and currently not stable for discharge.        Consultants:   Dr. Arita Miss 06/23/2020  Palliative care: Mayra Reel, NP  Procedures:   Chest x-ray 06/23/2020, 06/24/2019  Renal ultrasound 06/21/2020  CT head 06/21/2020  CT stone protocol 06/24/2020  Cystoscopy/left ureteroscopy and stone removal/uteroscopic laser lithotripsy/urethral stent placement/left retrograde pyelogram 3 with interpretation per Dr. Arita Miss 06/25/2020   Antimicrobials:   IV Unasyn 06/25/2020>>>> 06/25/2020  IV Rocephin 06/21/2020 >>>   IV Flagyl 06/25/2020>>>> oral Flagyl  06/28/2020   Subjective: Patient sitting on commode.  Denies any chest pain or shortness of breath.  Wife at bedside.   Objective: Vitals:   06/27/20 0541 06/27/20 1315 06/27/20 2152 06/28/20 0611  BP: (!) 140/71 127/71 (!) 139/74 (!) 142/74  Pulse: 78 76 74 77  Resp: Temp: 97.7 F (36.5 C) 97.9 F (36.6 C) 98 F (36.7 C) (!) 97.5 F (36.4 C)  TempSrc: Oral Oral Oral Oral  SpO2: 97% 98% 98% 100%  Weight:      Height:        Intake/Output Summary (Last 24 hours) at 06/28/2020 1211 Last data filed at 06/28/2020 0810 Gross per 24 hour  Intake 393.69 ml  Output 2675 ml  Net -2281.31 ml   Filed Weights   06/21/20 1940 06/25/20 1345  Weight: 67.8 kg 69.4 kg    Examination:  General exam: NAD Respiratory system: CTAB.  No wheezes, no crackles, no rhonchi.  Normal respiratory effort.  Speaking in full sentences.  Cardiovascular system: Regular rate and rhythm no murmurs rubs or gallops.  No JVD.  No lower extremity edema. Gastrointestinal system: Abdomen is soft, nontender, nondistended, positive bowel sounds.  No rebound.  No guarding.  Central nervous system: Alert. No focal neurological deficits. Extremities: Symmetric 5 x 5 power. Skin: No rashes, lesions or ulcers Psychiatry: Judgement and insight appear fair. Mood & affect appropriate.     Data Reviewed: I have personally reviewed following labs and imaging studies  CBC: Recent Labs  Lab 06/24/20 0334 06/25/20 0530 06/26/20 0434 06/27/20 0415 06/28/20 0456  WBC 12.5* 16.0* 19.5* 19.3* 19.9*  NEUTROABS 11.0* 13.8* 17.1* 16.7* 16.7*  HGB 7.6* 8.0* 8.4* 7.2* 8.0*  HCT 22.8* 23.2* 24.6* 21.4* 23.7*  MCV 100.4* 96.7 98.8 99.5 98.8  PLT 160 189 205 184 199    Basic Metabolic Panel: Recent Labs  Lab 06/24/20 0334 06/24/20 0334 06/25/20 0530 06/26/20 0430 06/26/20 0434 06/27/20 0415 06/28/20 0456  NA 144   < > 145 150* 151* 142 137  K 3.5   < > 3.0* 4.0 4.0 3.4* 3.3*  CL 120*   < > 108  112* 112* 105 102  CO2 12*   < > 19* 21* 22 21* 23  GLUCOSE 207*   < > 296* 152* 168* 214* 109*  BUN 87*   < > 93* 82* 83* 82* 68*  CREATININE 5.11*   < > 4.84* 4.70* 4.76* 4.42* 3.81*  CALCIUM 6.2*   < > 5.9* 5.7* 5.9* 5.5* 5.7*  MG 1.5*  --  1.6*  --  2.5*  --  2.0  PHOS 5.0*  --  3.1  --  2.9 3.3 2.8   < > = values in this interval not displayed.  GFR: Estimated Creatinine Clearance: 15.4 mL/min (A) (by C-G formula based on SCr of 3.81 mg/dL (H)).  Liver Function Tests: Recent Labs  Lab 06/21/20 1312 06/21/20 1312 06/22/20 0407 06/22/20 0407 06/24/20 0334 06/25/20 0530 06/26/20 0434 06/27/20 0415 06/28/20 0456  AST 14*  --  11*  --  10* 22  --   --   --   ALT 8  --  7  --  5 10  --   --   --   ALKPHOS 179*  --  160*  --  122 138*  --   --   --   BILITOT 0.2*  --  0.4  --  0.7 0.7  --   --   --   PROT 6.6  --  6.1*  --  5.6* 6.0*  --   --   --   ALBUMIN 3.6   < > 3.4*   < > 2.8* 2.9* 2.8* 2.6* 2.8*   < > = values in this interval not displayed.    CBG: Recent Labs  Lab 06/27/20 1216 06/27/20 1717 06/27/20 2155 06/28/20 0753 06/28/20 1151  GLUCAP 194* 151* 160* 110* 158*     Recent Results (from the past 240 hour(s))  SARS Coronavirus 2 by RT PCR (hospital order, performed in [facility] hospital lab) Nasopharyngeal Nasopharyngeal Swab     Status: None   Collection Time: 06/21/20  3:20 PM   Specimen: Nasopharyngeal Swab  Result Value Ref Range Status   SARS Coronavirus 2 NEGATIVE NEGATIVE Final    Comment: (NOTE) SARS-CoV-2 target nucleic acids are NOT DETECTED.  The SARS-CoV-2 RNA is generally detectable in upper and lower respiratory specimens during the acute phase of infection. The lowest concentration of SARS-CoV-2 viral copies this assay can detect is 250 copies / mL. A negative result does not preclude SARS-CoV-2 infection and should not be used as the sole basis for treatment or other patient management decisions.  A negative result may occur  with improper specimen collection / handling, submission of specimen other than nasopharyngeal swab, presence of viral mutation(s) within the areas targeted by this assay, and inadequate number of viral copies (<250 copies / mL). A negative result must be combined with clinical observations, patient history, and epidemiological information.  Fact Sheet for Patients:   BoilerBrush.com.cy  Fact Sheet for Healthcare Providers: https://pope.com/  This test is not yet approved or  cleared by the [place] FDA and has been authorized for detection and/or diagnosis of SARS-CoV-2 by FDA under an Emergency Use Authorization (EUA).  This EUA will remain in effect (meaning this test can be used) for the duration of the COVID-19 declaration under Section 564(b)(1) of the Act, 21 U.S.C. section 360bbb-3(b)(1), unless the authorization is terminated or revoked sooner.  Performed at [facility], [address] [place], [place] [address]   Urine Culture     Status: Abnormal   Collection Time: 06/21/20  4:01 PM   Specimen: Urine, Random  Result Value Ref Range Status   Specimen Description   Final    URINE, RANDOM Performed at [facility], [address], [place], [place] [address]    Special Requests   Final    NONE Performed at [facility], [address], [place], [place] [address]    Culture MULTIPLE SPECIES PRESENT, SUGGEST RECOLLECTION (A)  Final   Report Status 06/22/2020 FINAL  Final  Culture, blood (routine x 2)     Status: None  Collection Time: 06/22/20  1:12 PM   Specimen: BLOOD  Result Value Ref Range Status   Specimen Description   Final    BLOOD LEFT HAND Performed at [facility], [address] [place], [place] [address]    Special Requests   Final    BOTTLES DRAWN AEROBIC ONLY Blood Culture adequate volume Performed at [facility], [address], [place], [place] [address]    Culture   Final    NO GROWTH 5 DAYS Performed at [facility], [address], [place], [place] [address]    Report Status 06/27/2020 FINAL  Final  Culture, blood (routine x 2)     Status: None   Collection Time: 06/22/20  1:16 PM   Specimen: BLOOD  Result Value Ref Range Status   Specimen Description   Final    BLOOD LEFT ANTECUBITAL Performed at [facility], [address], [place], [place] [address]    Special Requests   Final    BOTTLES DRAWN AEROBIC AND ANAEROBIC Blood Culture adequate volume Performed at [facility], [address], [place], [place] [address]    Culture   Final    NO GROWTH 5 DAYS Performed at [facility], [address], [place], [place] [address]    Report Status 06/27/2020 FINAL  Final  Culture, Urine     Status: None   Collection Time: 06/26/20  8:19 AM   Specimen: Urine, Catheterized  Result Value Ref Range Status   Specimen Description   Final    URINE, CATHETERIZED Performed at [facility], [address] [place], [place] [address]    Special Requests   Final    NONE Performed at [facility], [address], [place], [place] [address]    Culture   Final    NO GROWTH Performed at [facility], [address], [place], [place] [address]    Report Status 06/27/2020 FINAL  Final         Radiology Studies: No results found.      Scheduled Meds: . bisacodyl  10 mg Rectal Daily  . calcium-vitamin D  2 tablet Oral TID  . Carbidopa-Levodopa ER  1.5 tablet Oral TID  . Chlorhexidine Gluconate Cloth  6 each Topical Daily  . donepezil  5 mg Oral QHS  . ferrous sulfate  325 mg Oral Q breakfast  . insulin aspart  0-5 Units Subcutaneous QHS  . insulin aspart  0-9 Units Subcutaneous TID WC  . insulin glargine  5 Units Subcutaneous BID  . lipase/protease/amylase  36,000 Units  Oral TID with meals  . magnesium oxide  400 mg Oral BID  . metroNIDAZOLE  500 mg Oral Q8H  . polyethylene glycol  17 g Oral Daily  . rOPINIRole  2 mg Oral QHS  . senna-docusate  1 tablet Oral BID  . venlafaxine XR  225 mg Oral Q breakfast  . vitamin B-12  1,000 mcg Oral Daily   Continuous Infusions: . sodium chloride 100 mL/hr at 06/28/20 1049  . cefTRIAXone (ROCEPHIN)  IV 2 g (06/27/20 2211)     LOS: 7 days    Time spent: 35 minutes    Ramiro Harvest, MD [facility]   To contact the attending provider between 7A-7P or the covering provider during after hours 7P-7A, please log into the web site [email or web address] and access using universal [place] password for that web site.  If you do not have the password, please call the hospital operator.  06/28/2020, 12:11 PM

## 2020-06-28 NOTE — Progress Notes (Signed)
Critical value alert  Calcium 5.7  Notified Blount, NP  Awaiting new orders

## 2020-06-29 DIAGNOSIS — D519 Vitamin B12 deficiency anemia, unspecified: Secondary | ICD-10-CM

## 2020-06-29 DIAGNOSIS — E870 Hyperosmolality and hypernatremia: Secondary | ICD-10-CM

## 2020-06-29 DIAGNOSIS — R531 Weakness: Secondary | ICD-10-CM

## 2020-06-29 LAB — RENAL FUNCTION PANEL
Albumin: 2.8 g/dL — ABNORMAL LOW (ref 3.5–5.0)
Anion gap: 10 (ref 5–15)
BUN: 66 mg/dL — ABNORMAL HIGH (ref 8–23)
CO2: 23 mmol/L (ref 22–32)
Calcium: 5.8 mg/dL — CL (ref 8.9–10.3)
Chloride: 103 mmol/L (ref 98–111)
Creatinine, Ser: 3.96 mg/dL — ABNORMAL HIGH (ref 0.61–1.24)
GFR calc Af Amer: 16 mL/min — ABNORMAL LOW (ref >60)
GFR calc non Af Amer: 13 mL/min — ABNORMAL LOW (ref >60)
Glucose, Bld: 144 mg/dL — ABNORMAL HIGH (ref 70–99)
Phosphorus: 3.3 mg/dL (ref 2.5–4.6)
Potassium: 3.8 mmol/L (ref 3.5–5.1)
Sodium: 136 mmol/L (ref 135–145)

## 2020-06-29 LAB — CBC WITH DIFFERENTIAL/PLATELET
Abs Immature Granulocytes: 0.28 10*3/uL — ABNORMAL HIGH (ref 0.00–0.07)
Basophils Absolute: 0 10*3/uL (ref 0.0–0.1)
Basophils Relative: 0 %
Eosinophils Absolute: 0.1 10*3/uL (ref 0.0–0.5)
Eosinophils Relative: 1 %
HCT: 23.2 % — ABNORMAL LOW (ref 39.0–52.0)
Hemoglobin: 7.8 g/dL — ABNORMAL LOW (ref 13.0–17.0)
Immature Granulocytes: 2 %
Lymphocytes Relative: 10 %
Lymphs Abs: 1.4 10*3/uL (ref 0.7–4.0)
MCH: 33.2 pg (ref 26.0–34.0)
MCHC: 33.6 g/dL (ref 30.0–36.0)
MCV: 98.7 fL (ref 80.0–100.0)
Monocytes Absolute: 0.9 10*3/uL (ref 0.1–1.0)
Monocytes Relative: 6 %
Neutro Abs: 11.4 10*3/uL — ABNORMAL HIGH (ref 1.7–7.7)
Neutrophils Relative %: 81 %
Platelets: 200 10*3/uL (ref 150–400)
RBC: 2.35 MIL/uL — ABNORMAL LOW (ref 4.22–5.81)
RDW: 14.3 % (ref 11.5–15.5)
WBC: 14.1 10*3/uL — ABNORMAL HIGH (ref 4.0–10.5)
nRBC: 0 % (ref 0.0–0.2)

## 2020-06-29 LAB — GLUCOSE, CAPILLARY
Glucose-Capillary: 115 mg/dL — ABNORMAL HIGH (ref 70–99)
Glucose-Capillary: 253 mg/dL — ABNORMAL HIGH (ref 70–99)
Glucose-Capillary: 275 mg/dL — ABNORMAL HIGH (ref 70–99)

## 2020-06-29 MED ORDER — CALCIUM GLUCONATE-NACL 2-0.675 GM/100ML-% IV SOLN
2.0000 g | Freq: Once | INTRAVENOUS | Status: AC
  Administered 2020-06-29: 2000 mg via INTRAVENOUS
  Filled 2020-06-29: qty 100

## 2020-06-29 MED ORDER — CYANOCOBALAMIN 1000 MCG PO TABS: 1000.0000 ug | ORAL_TABLET | Freq: Every day | ORAL | 1 refills | Status: AC

## 2020-06-29 MED ORDER — FERROUS SULFATE 325 (65 FE) MG PO TABS: 325.0000 mg | ORAL_TABLET | Freq: Every day | ORAL | 3 refills | Status: AC

## 2020-06-29 MED ORDER — SENNOSIDES-DOCUSATE SODIUM 8.6-50 MG PO TABS: 1.0000 | ORAL_TABLET | Freq: Two times a day (BID) | ORAL | 0 refills | Status: AC

## 2020-06-29 MED ORDER — SIMVASTATIN 40 MG PO TABS: 40.0000 mg | ORAL_TABLET | Freq: Every day | ORAL | Status: AC

## 2020-06-29 MED ORDER — POLYETHYLENE GLYCOL 3350 17 G PO PACK: 17.0000 g | PACK | Freq: Every day | ORAL | 0 refills | Status: AC

## 2020-06-29 MED ORDER — BISACODYL 10 MG RE SUPP: 10.0000 mg | Freq: Every day | RECTAL | 0 refills | Status: AC | PRN

## 2020-06-29 MED ORDER — INSULIN GLARGINE 100 UNITS/ML SOLOSTAR PEN: 10.0000 [IU] | PEN_INJECTOR | Freq: Two times a day (BID) | SUBCUTANEOUS | 0 refills | Status: AC

## 2020-06-29 MED ORDER — INSULIN GLARGINE 100 UNIT/ML ~~LOC~~ SOLN
10.0000 [IU] | Freq: Two times a day (BID) | SUBCUTANEOUS | Status: DC
  Filled 2020-06-29: qty 0.1

## 2020-06-29 MED ORDER — AMOXICILLIN-POT CLAVULANATE 500-125 MG PO TABS: 1.0000 | ORAL_TABLET | Freq: Two times a day (BID) | ORAL | 0 refills | Status: AC

## 2020-06-29 MED ORDER — QUETIAPINE FUMARATE 25 MG PO TABS: 25.0000 mg | ORAL_TABLET | Freq: Every evening | ORAL | 0 refills | Status: AC | PRN

## 2020-06-29 MED ORDER — ENSURE ENLIVE PO LIQD: 237.0000 mL | Freq: Two times a day (BID) | ORAL | 12 refills | Status: AC | PRN

## 2020-06-29 MED ORDER — CALCIUM CARBONATE-VITAMIN D 500-200 MG-UNIT PO TABS: 2.0000 | ORAL_TABLET | Freq: Three times a day (TID) | ORAL | 1 refills | Status: AC

## 2020-06-29 MED ORDER — MAGNESIUM OXIDE 400 (241.3 MG) MG PO TABS: 400.0000 mg | ORAL_TABLET | Freq: Two times a day (BID) | ORAL | 0 refills | Status: AC

## 2020-06-29 MED ORDER — SODIUM CHLORIDE 0.9 % IV SOLN
2.0000 g | INTRAVENOUS | Status: DC
  Administered 2020-06-29: 2 g via INTRAVENOUS
  Filled 2020-06-29: qty 2

## 2020-06-29 NOTE — Progress Notes (Signed)
Critical Lab Calcium 5.8 Provider made aware.

## 2020-06-29 NOTE — Discharge Summary (Signed)
Physician Discharge Summary  Randall [name] ZOX:[id number] DOB: 1941/07/11 DOA: 06/21/2020  PCP: Gaspar Garbe, MD  Admit date: 06/21/2020 Discharge date: 06/29/2020  Time spent: 55 minutes  Recommendations for Outpatient Follow-up:  1. Patient was discharged home with hospice following. 2. Follow-up with Dr. Arita Miss, urology in 2 weeks. 3. Follow-up with Tisovec, Adelfa Koh, MD in 1 week.  On follow-up patient will need a renal panel done to follow-up on electrolytes, renal function.  Patient also need a CBC done to follow-up on leukocytosis as well as H&H.   Discharge Diagnoses:  Principal Problem:   ARF (acute renal failure) (HCC) Active Problems:   Diabetes (HCC)   Parkinson disease (HCC)   S/P total knee arthroplasty   Hyperlipidemia   Benign fibroma of prostate   Acute lower UTI   DNR (do not resuscitate)   Hypokalemia   Hyperphosphatemia   Metabolic acidosis, increased anion gap   Hypomagnesemia   Palliative care by specialist   Hydronephrosis with renal and ureteral calculus obstruction   Hypocalcemia   Hypernatremia   Discharge Condition: Stable and improved  Diet recommendation: Soft diet  Filed Weights   06/21/20 1940 06/25/20 1345  Weight: 67.8 kg 69.4 kg    History of present illness:  HPI per Dr Jan Fireman is a 79 y.o. male with medical history significant of parkinson's disease, DM2, arthritis who presents to the ED with concerns of increased weakness and poor PO intake. Pt is somewhat confused at this time. Majority of history was obtained from pt's wife who is at bedside. Per family , pt had been in his usual state of health until 5 days prior to admit, when he acutely lost his appetite and had only minimal amounts of PO intake. Decreased appetite continued and patient soon lost the majority of his strength, ultimately being unable to stand even with assistance one day prior to admit. Pt has been complaining of nausea during this time and pt's  wife reports witnessing aspiration event while pt was drinking fluids on the morning of admission. Pt denies coughing or chest pains.   Of note, patient's wife is currently in the process of establishing pt with home hospice services through "friends."   ED Course: In the ED, pt noted to have BUN/Cr of 81 and 4.76, respectively. WBC noted to be 13.7. Bladder scan revealed around 180cc of urine. UA was notable for large leukocytes, many bacteria, and large blood. CXR was obtained, reviewed, and was found to be clear. Pt was started empirically on rocephin and 1LNS bolus was given. Hospitalist consulted for consideration for admission.  Hospital Course:  1 acute renal failure/bilateral hydronephrosis/anion gap metabolic acidosis Patient had presented with increased weakness, decreased oral intake noted to be in acute renal failure with a creatinine on admission of 4.76 which went up as high as 5.11.  Last creatinine noted on epic was 0.62 on 03/10/2014.  Baseline creatinine per Dr. Lowell Guitar was 1.39 in 2020.  Renal ultrasound on 06/21/2020 with mild right and moderate left hydronephrosis status post Foley catheter placement.  Follow-up CT renal stone protocol 06/24/2020 with moderate left hydronephrosis noted with perinephric stranding, secondary to 1 cm proximal left ureteral calculus, minimal bilateral pleural effusions noted with adjacent subsegmental atelectasis.  Calcifications noted throughout the pancreas consistent with chronic pancreatitis.  Large amount of stool seen in the sigmoid colon and rectum.  Patient seen by urology and patient s/p ureteroscopy with laser lithotripsy and stent placement on 06/25/2020.  Patient  with a worsening leukocytosis which seems to be stabilized and trended back down by day of discharge, likely secondary to recent instrumentation.  Repeat urine cultures negative.  Patient maintained on IV Rocephin.  IV bicarb discontinued.  Patient improved clinically.  Remained afebrile.   Leukocytosis trended down.  Patient was discharged home on 3 more days of oral Augmentin to complete a course of antibiotic treatment.  Outpatient follow-up with urology 2 weeks post discharge.    2.  UTI Urinalysis concerning for UTI with > 50 WBCs, many bacteria, large blood, large leukocytosis and patient noted to have a elevated WBC.  Urine cultures with multiple species.  Blood cultures with no growth to date.  Patient maintained on IV Rocephin during the hospitalization.  Patient underwent urological procedure/instrumentation with stent placement, on 06/25/2020.  Patient be discharged home on 3 more days of Augmentin to complete a full course of antibiotic treatment.   3.  Possible aspiration pneumonitis Patient noted to have a witnessed aspiration event on the morning of admission while trying to drink fluids.  The night of 06/23/2020 patient with an episode of aspiration with pills.  Patient seen by speech therapy.    Patient maintained on a soft diet with thin liquids.  Leukocytosis trended back down.  Patient was maintained on IV Rocephin and IV Flagyl added to patient's regimen.  Patient received a dose of Unasyn which was subsequently transition back to IV Rocephin due to concerns for concurrent UTI.  Patient remained afebrile.  Patient be discharged home on 3 days of Augmentin to complete a course of antibiotic treatment.  Outpatient follow-up.  4.  Well-controlled diabetes mellitus type 2 Hemoglobin A1c 6.0.  Patient noted to be on insulin prior to admission.  Patient during the hospitalization was noted to have low blood glucose levels and as such long-acting insulin was initially held and patient subsequently started at a lower dose of Lantus 5 units twice daily and dose uptitrated to 10 units twice daily as patient's renal function improved as well as oral intake.  Outpatient follow-up.   5.  Parkinson's Patient maintained on home regimen of medication during the hospitalization.   Outpatient follow-up.   6.  Osteoarthritis Status post prior knee surgery.  Patient seen by PT/ OT who recommended home health therapies.  Patient however is being discharged home with hospice.    7.  Hyperlipidemia Patient will resume home regimen statin 1 week post discharge.  Outpatient follow-up.   8.  History of fibroma of the prostate Follows urology at [facility] last seen on 06/16/2020.  Continue current home regimen.  Outpatient follow-up.  9.  Hypocalcemia secondary to vitamin D deficiency Secondary to malnutrition and vitamin D deficiency.  Vitamin D 25 hydroxy at < 4.20.  Patient also noted to be hypomagnesium and hypophosphatemic.  Magnesium at 2.5.  Phosphorus at 3.3.  Electrolytes repleted.  Patient also received IV calcium gluconate.  Patient started on Os-Cal with vitamin D  2 tablets 3 times daily  which patient will be discharged home on.  Outpatient follow-up.   10.-Iron deficiency anemia/B12 deficiency Anemia panel concerning for B12 deficiency and iron deficiency anemia.  Status post 1 unit packed red blood cells (06/23/2020).  Hemoglobin stabilized at 7.8 by day of discharge.  Patient was also placed on oral vitamin B12 supplementation as well as oral iron supplementation.  Outpatient follow-up.   11.  Moderate protein calorie malnutrition Patient was maintained on nutritional supplementation.    12.  Hypokalemia/hypomagnesemia/hypocalcemia Electrolytes were  repleted.  Potassium noted at 3.8 by day of discharge, patient with asymptomatic hypocalcemia, secondary to vitamin D deficiency.  Patient received doses of IV calcium gluconate throughout the hospitalization.  Patient also started on Os-Cal with vitamin D 2 tablets 3 times daily which patient will be discharged home on.  Outpatient follow-up with PCP.    13.  Constipation Noted on CT renal stone protocol.    Patient was placed on Dulcolax suppository daily.  MiraLAX 17 g p.o. daily.  Senokot S2 twice daily with  good bowel movements.  Patient will be discharged home on MiraLAX daily, Senokot-S twice daily as well as Dulcolax as needed..  14.  Hypernatremia Resolved on D5W.  Blood glucose levels improved.  Outpatient follow-up.   15.  Goals of care Patient presented with decreased oral intake, noted to be in acute renal failure, with hydronephrosis bilateral.  Patient has been assessed by urology and status post ureteroscopy with laser lithotripsy and stent placement(06/25/2020).  Foley catheter has been placed.  Patient seen in consultation by palliative care and hospice and decision was made that once patient was medically stable to discharge home with hospice following.  Patient will be discharged home in stable condition.    Procedures:  Chest x-ray 06/23/2020, 06/24/2019  Renal ultrasound 06/21/2020  CT head 06/21/2020  CT stone protocol 06/24/2020  Cystoscopy/left ureteroscopy and stone removal/uteroscopic laser lithotripsy/urethral stent placement/left retrograde pyelogram 3 with interpretation per Dr. Arita Miss 06/25/2020    Consultations:  Urology: Dr. Arita Miss 06/23/2020  Palliative care: Mayra Reel, NP  Discharge Exam: Vitals:   06/29/20 0522 06/29/20 1236  BP: (!) 115/61 (!) 137/60  Pulse: 71 66  Resp: 16 18  Temp: 97.9 F (36.6 C) 97.8 F (36.6 C)  SpO2: 92% 98%    General: NAD Cardiovascular: RRR Respiratory: CTAB  Discharge Instructions   Discharge Instructions    Diet general   Complete by: As directed    Soft diet.   Increase activity slowly   Complete by: As directed      Allergies as of 06/29/2020      Reactions   Iodine Nausea And Vomiting   CT trace    Shellfish Allergy Nausea And Vomiting      Medication List    TAKE these medications   amoxicillin-clavulanate 500-125 MG tablet Commonly known as: AUGMENTIN Take 1 tablet (500 mg total) by mouth in the morning and at bedtime for 3 days. Start taking on: June 30, 2020   bisacodyl 10 MG  suppository Commonly known as: DULCOLAX Place 1 suppository (10 mg total) rectally daily as needed for moderate constipation.   calcium-vitamin D 500-200 MG-UNIT tablet Commonly known as: OSCAL WITH D Take 2 tablets by mouth 3 (three) times daily.   Carbidopa-Levodopa ER 25-100 MG tablet controlled release Commonly known as: SINEMET CR Take 1.5 tablets by mouth 3 (three) times daily.   Creon 36000 UNITS Cpep capsule Generic drug: lipase/protease/amylase TAKE 2 CAPSULES WITH EACH MEAL AND 1 CAPSULE WITH A SNACK-UP TO 7 CAPS PER DAY What changed: See the new instructions.   cyanocobalamin 1000 MCG tablet Take 1 tablet (1,000 mcg total) by mouth daily. Start taking on: June 30, 2020   donepezil 5 MG tablet Commonly known as: ARICEPT Take 5 mg by mouth at bedtime.   Effexor XR 75 MG 24 hr capsule Generic drug: venlafaxine XR Take 225 mg by mouth daily with breakfast.   feeding supplement (ENSURE ENLIVE) Liqd Take 237 mLs by mouth 2 (two) times  daily as needed (If patient or family request).   ferrous sulfate 325 (65 FE) MG tablet Take 1 tablet (325 mg total) by mouth daily with breakfast. Start taking on: June 30, 2020   insulin glargine 100 unit/mL Sopn Commonly known as: LANTUS Inject 0.1 mLs (10 Units total) into the skin 2 (two) times daily. What changed:   how much to take  when to take this  additional instructions   insulin lispro 100 UNIT/ML injection Commonly known as: HUMALOG Inject 5 Units into the skin as needed for high blood sugar (takes if blood sugar over 150).   magnesium oxide 400 (241.3 Mg) MG tablet Commonly known as: MAG-OX Take 1 tablet (400 mg total) by mouth 2 (two) times daily.   Myrbetriq 50 MG Tb24 tablet Generic drug: mirabegron ER Take 50 mg by mouth daily.   polyethylene glycol 17 g packet Commonly known as: MIRALAX / GLYCOLAX Take 17 g by mouth daily. Start taking on: June 30, 2020   QUEtiapine 25 MG tablet Commonly known as:  SEROQUEL Take 1 tablet (25 mg total) by mouth at bedtime as needed (insomnia/agitation).   Ropinirole HCl 6 MG Tb24 Take 6 mg by mouth daily.   senna-docusate 8.6-50 MG tablet Commonly known as: Senokot-S Take 1 tablet by mouth 2 (two) times daily.   simvastatin 40 MG tablet Commonly known as: ZOCOR Take 1 tablet (40 mg total) by mouth daily. Lowers cholesterol Start taking on: July 06, 2020 What changed: These instructions start on July 06, 2020. If you are unsure what to do until then, ask your doctor or other care provider.   triamcinolone cream 0.1 % Commonly known as: KENALOG Apply 1 application topically daily as needed (for eczema).      Allergies  Allergen Reactions  . Iodine Nausea And Vomiting    CT trace   . Shellfish Allergy Nausea And Vomiting    Follow-up Information    Tisovec, Adelfa Koh, MD. Schedule an appointment as soon as possible for a visit in 1 week(s).   Specialty: Internal Medicine Contact information: [address] [place] [address] [phone]        Kasandra Knudsen D, MD. Schedule an appointment as soon as possible for a visit in 2 week(s).   Specialty: Urology Contact information: [address] [place] [address] [place] [address] [phone]                The results of significant diagnostics from this hospitalization (including imaging, microbiology, ancillary and laboratory) are listed below for reference.    Significant Diagnostic Studies: DG Chest 2 View  Result Date: 06/21/2020 CLINICAL DATA:  Worsening weakness and difficulty swallowing. Cough. EXAM: CHEST - 2 VIEW COMPARISON:  May 10, 2005 FINDINGS: The heart size and mediastinal contours are within normal limits. Both lungs are clear. The visualized skeletal structures are unremarkable. IMPRESSION: No active cardiopulmonary disease. Electronically Signed   By: Ted Mcalpine M.D.   On: 06/21/2020 14:16   CT HEAD WO CONTRAST  Result Date:  06/21/2020 CLINICAL DATA:  Altered mental status. EXAM: CT HEAD WITHOUT CONTRAST TECHNIQUE: Contiguous axial images were obtained from the base of the skull through the vertex without intravenous contrast. COMPARISON:  04/01/2015. FINDINGS: Brain: Moderate to marked dilatation of the ventricles and moderate dilatation of the cortical sulci with progression. Mild to moderate patchy white matter low density in both cerebral hemispheres with progression. Interval right frontal electrode with its tip at the inferior aspect of the thalamus  on the right. No intracranial hemorrhage, mass lesion or CT evidence of acute infarction. Vascular: No hyperdense vessel or unexpected calcification. Skull: Normal. Negative for fracture or focal lesion. Sinuses/Orbits: Status post bilateral cataract extraction. Unremarkable bones and included paranasal sinuses. Other: None. IMPRESSION: 1. No acute abnormality. 2. Moderate to marked diffuse cerebral and cerebellar atrophy with progression. 3. Mild to moderate chronic small vessel white matter ischemic changes in both cerebral hemispheres with progression. 4. Interval right frontal electrode with its tip at the inferior aspect of the thalamus on the right. Electronically Signed   By: Beckie Salts M.D.   On: 06/21/2020 14:31   US RENAL  Result Date: 06/21/2020 CLINICAL DATA:  Acute renal failure. EXAM: RENAL / URINARY TRACT ULTRASOUND COMPLETE COMPARISON:  None. FINDINGS: Right Kidney: Renal measurements: 10.1 x 5.3 x 5.7 cm = volume: 159 mL. Increased echogenicity. Mild right hydronephrosis. Left Kidney: Renal measurements: 12.4 x 6.8 x 5.8 cm = volume: 255 mL. Increased echogenicity. Moderate hydronephrosis. Bladder: Thick-walled with mobile debris.  Bilateral ureteral jets seen. Other: None. IMPRESSION: 1.  Increased echogenicity of the bilateral kidneys. 2.  Mild right and moderate left hydronephrosis. 3.  Thick-walled urinary bladder contains mobile debris. 4.  Bilateral  ureteral jets are present. Electronically Signed   By: Ted Mcalpine M.D.   On: 06/21/2020 18:29   DG CHEST PORT 1 VIEW  Result Date: 06/23/2020 CLINICAL DATA:  Aspiration EXAM: PORTABLE CHEST 1 VIEW COMPARISON:  06/21/2020 FINDINGS: Lungs are clear. No pneumothorax or pleural effusion. Cardiac size within normal limits. Pulmonary vascularity is normal. Neurostimulator battery pack overlies the right hemithorax with the electrode extending superiorly, unchanged. IMPRESSION: No active disease. Electronically Signed   By: Helyn Numbers MD   On: 06/23/2020 22:55   DG C-Arm 1-60 Min-No Report  Result Date: 06/25/2020 Fluoroscopy was utilized by the requesting physician.  No radiographic interpretation.   CT RENAL STONE STUDY  Result Date: 06/24/2020 CLINICAL DATA:  Acute renal failure. EXAM: CT ABDOMEN AND PELVIS WITHOUT CONTRAST TECHNIQUE: Multidetector CT imaging of the abdomen and pelvis was performed following the standard protocol without IV contrast. COMPARISON:  None. FINDINGS: Lower chest: Minimal bilateral pleural effusions are noted with adjacent subsegmental atelectasis. Hepatobiliary: No focal liver abnormality is seen. No gallstones, gallbladder wall thickening, or biliary dilatation. Pancreas: Calcifications are noted throughout the pancreas consistent with chronic pancreatitis. No definite acute abnormality or ductal dilatation is noted. Spleen: Probable cyst or hemangioma is noted anteriorly. Adrenals/Urinary Tract: Adrenal glands appear normal. Right kidney and ureter are unremarkable. Moderate left hydronephrosis is noted with perinephric stranding, secondary to 1 cm proximal left ureteral calculus. Foley catheter is noted in urinary bladder. Stomach/Bowel: The stomach appears normal. There is no evidence of bowel obstruction or inflammation. The appendix is not clearly visualized, but no inflammation is noted in the right lower quadrant. Large amount of stool seen in the sigmoid  colon and rectum. Vascular/Lymphatic: Aortic atherosclerosis. No enlarged abdominal or pelvic lymph nodes. Reproductive: Prostate is unremarkable. Other: No abdominal wall hernia or abnormality. No abdominopelvic ascites. Musculoskeletal: No acute or significant osseous findings. IMPRESSION: 1. Moderate left hydronephrosis is noted with perinephric stranding, secondary to 1 cm proximal left ureteral calculus. 2. Minimal bilateral pleural effusions are noted with adjacent subsegmental atelectasis. 3. Calcifications are noted throughout the pancreas consistent with chronic pancreatitis. 4. Large amount of stool seen in the sigmoid colon and rectum. Aortic Atherosclerosis (ICD10-I70.0). Electronically Signed   By: Lupita Raider M.D.   On: 06/24/2020  13:51    Microbiology: Recent Results (from the past 240 hour(s))  SARS Coronavirus 2 by RT PCR (hospital order, performed in [facility] hospital lab) Nasopharyngeal Nasopharyngeal Swab     Status: None   Collection Time: 06/21/20  3:20 PM   Specimen: Nasopharyngeal Swab  Result Value Ref Range Status   SARS Coronavirus 2 NEGATIVE NEGATIVE Final    Comment: (NOTE) SARS-CoV-2 target nucleic acids are NOT DETECTED.  The SARS-CoV-2 RNA is generally detectable in upper and lower respiratory specimens during the acute phase of infection. The lowest concentration of SARS-CoV-2 viral copies this assay can detect is 250 copies / mL. A negative result does not preclude SARS-CoV-2 infection and should not be used as the sole basis for treatment or other patient management decisions.  A negative result may occur with improper specimen collection / handling, submission of specimen other than nasopharyngeal swab, presence of viral mutation(s) within the areas targeted by this assay, and inadequate number of viral copies (<250 copies / mL). A negative result must be combined with clinical observations, patient history, and epidemiological information.  Fact  Sheet for Patients:   BoilerBrush.com.cy  Fact Sheet for Healthcare Providers: https://pope.com/  This test is not yet approved or  cleared by the [place] FDA and has been authorized for detection and/or diagnosis of SARS-CoV-2 by FDA under an Emergency Use Authorization (EUA).  This EUA will remain in effect (meaning this test can be used) for the duration of the COVID-19 declaration under Section 564(b)(1) of the Act, 21 U.S.C. section 360bbb-3(b)(1), unless the authorization is terminated or revoked sooner.  Performed at [facility], [address] [place], [place] [address]   Urine Culture     Status: Abnormal   Collection Time: 06/21/20  4:01 PM   Specimen: Urine, Random  Result Value Ref Range Status   Specimen Description   Final    URINE, RANDOM Performed at [facility], [address], [place], [place] [address]    Special Requests   Final    NONE Performed at [facility], [address], [place], [place] [address]    Culture MULTIPLE SPECIES PRESENT, SUGGEST RECOLLECTION (A)  Final   Report Status 06/22/2020 FINAL  Final  Culture, blood (routine x 2)     Status: None   Collection Time: 06/22/20  1:12 PM   Specimen: BLOOD  Result Value Ref Range Status   Specimen Description   Final    BLOOD LEFT HAND Performed at [facility], [address] [place], [place] [address]    Special Requests   Final    BOTTLES DRAWN AEROBIC ONLY Blood Culture adequate volume Performed at [facility], [address] [place], [place] [address]    Culture   Final    NO GROWTH 5 DAYS Performed at [facility], [address], [place], [place] [address]    Report Status 06/27/2020 FINAL  Final  Culture, blood (routine x 2)     Status: None   Collection Time: 06/22/20  1:16 PM   Specimen: BLOOD  Result Value Ref Range Status    Specimen Description   Final    BLOOD LEFT ANTECUBITAL Performed at [facility], [address], [place], [place] [address]    Special Requests   Final    BOTTLES DRAWN AEROBIC AND ANAEROBIC Blood Culture adequate volume Performed at [facility], [address] [place],  [place] [address]    Culture   Final    NO GROWTH 5 DAYS Performed at [facility], [address], [place], [place] [address]    Report Status 06/27/2020 FINAL  Final  Culture, Urine     Status: None   Collection Time: 06/26/20  8:19 AM   Specimen: Urine, Catheterized  Result Value Ref Range Status   Specimen Description   Final    URINE, CATHETERIZED Performed at [facility], [address], [place], [place] [address]    Special Requests   Final    NONE Performed at [facility], [address], [place], [place] [address]    Culture   Final    NO GROWTH Performed at [facility], [address], [place], [place] [address]    Report Status 06/27/2020 FINAL  Final     Labs: Basic Metabolic Panel: Recent Labs  Lab 06/24/20 0334 06/24/20 0334 06/25/20 0530 06/25/20 0530 06/26/20 0430 06/26/20 0434 06/27/20 0415 06/28/20 0456 06/29/20 0442  NA 144   < > 145   < > 150* 151* 142 137 136  K 3.5   < > 3.0*   < > 4.0 4.0 3.4* 3.3* 3.8  CL 120*   < > 108   < > 112* 112* 105 102 103  CO2 12*   < > 19*   < > 21* 22 21* 23 23  GLUCOSE 207*   < > 296*   < > 152* 168* 214* 109* 144*  BUN 87*   < > 93*   < > 82* 83* 82* 68* 66*  CREATININE 5.11*   < > 4.84*   < > 4.70* 4.76* 4.42* 3.81* 3.96*  CALCIUM 6.2*   < > 5.9*   < > 5.7* 5.9* 5.5* 5.7* 5.8*  MG 1.5*  --  1.6*  --   --  2.5*  --  2.0  --   PHOS 5.0*   < > 3.1  --   --  2.9 3.3 2.8 3.3   < > = values in this interval not displayed.   Liver Function Tests: Recent Labs  Lab 06/24/20 0334 06/24/20 0334 06/25/20 0530 06/26/20 0434 06/27/20 0415  06/28/20 0456 06/29/20 0442  AST 10*  --  22  --   --   --   --   ALT 5  --  10  --   --   --   --   ALKPHOS 122  --  138*  --   --   --   --   BILITOT 0.7  --  0.7  --   --   --   --   PROT 5.6*  --  6.0*  --   --   --   --   ALBUMIN 2.8*   < > 2.9* 2.8* 2.6* 2.8* 2.8*   < > = values in this interval not displayed.   No results for input(s): LIPASE, AMYLASE in the last 168 hours. No results for input(s): AMMONIA in the last 168 hours. CBC: Recent Labs  Lab 06/25/20 0530 06/26/20 0434 06/27/20 0415 06/28/20 0456 06/29/20 0442  WBC 16.0* 19.5* 19.3* 19.9* 14.1*  NEUTROABS 13.8* 17.1* 16.7* 16.7* 11.4*  HGB 8.0* 8.4* 7.2* 8.0* 7.8*  HCT 23.2* 24.6* 21.4* 23.7* 23.2*  MCV 96.7 98.8 99.5 98.8 98.7  PLT 189 205 184 199 200   Cardiac Enzymes: No results for input(s): CKTOTAL, CKMB, CKMBINDEX, TROPONINI  in the last 168 hours. BNP: BNP (last 3 results) No results for input(s): BNP in the last 8760 hours.  ProBNP (last 3 results) No results for input(s): PROBNP in the last 8760 hours.  CBG: Recent Labs  Lab 06/28/20 1151 06/28/20 1652 06/28/20 2123 06/29/20 0742 06/29/20 1158  GLUCAP 158* 225* 190* 115* 253*       Signed:  Ramiro Harvestaniel Euva Rundell MD.  [facility] 06/29/2020, 3:28 PM

## 2020-06-29 NOTE — Progress Notes (Signed)
AVS given to patient and explained at the bedside. Medications and follow up appointments have been explained with pt verbalizing understanding. [facility] home health notified of pt's imminent discharge.

## 2020-07-10 DIAGNOSIS — N132 Hydronephrosis with renal and ureteral calculous obstruction: Secondary | ICD-10-CM | POA: Diagnosis not present

## 2020-07-10 DIAGNOSIS — N201 Calculus of ureter: Secondary | ICD-10-CM | POA: Diagnosis not present

## 2020-08-04 ENCOUNTER — Other Ambulatory Visit: Payer: Self-pay

## 2020-08-04 ENCOUNTER — Telehealth: Payer: Self-pay | Admitting: Gastroenterology

## 2020-08-04 MED ORDER — PANCRELIPASE (LIP-PROT-AMYL) 36000-114000 UNITS PO CPEP: 36000.0000 [IU] | ORAL_CAPSULE | ORAL | 1 refills | Status: AC

## 2020-08-04 NOTE — Telephone Encounter (Signed)
Refill has been sent.  I also sent a my chart to make him and Ginger aware of refills. Randall [name] is due for a follow in in October 2021

## 2020-09-05 ENCOUNTER — Telehealth: Payer: Self-pay | Admitting: Gastroenterology

## 2020-09-05 NOTE — Telephone Encounter (Signed)
[name] is wanting to notify Dr Myrtie Neither that the pt passed away on 08/31/2020.

## 2020-09-05 DEATH — deceased

## 2020-10-01 ENCOUNTER — Ambulatory Visit: Payer: Medicare Other | Admitting: Gastroenterology

## 2021-12-11 IMAGING — DX DG CHEST 1V PORT
1 series · 1 of 1 positions shown · non-contrast
Comparison: 06/21/2020

CLINICAL DATA: Aspiration

EXAM:
PORTABLE CHEST 1 VIEW

[chest ap]
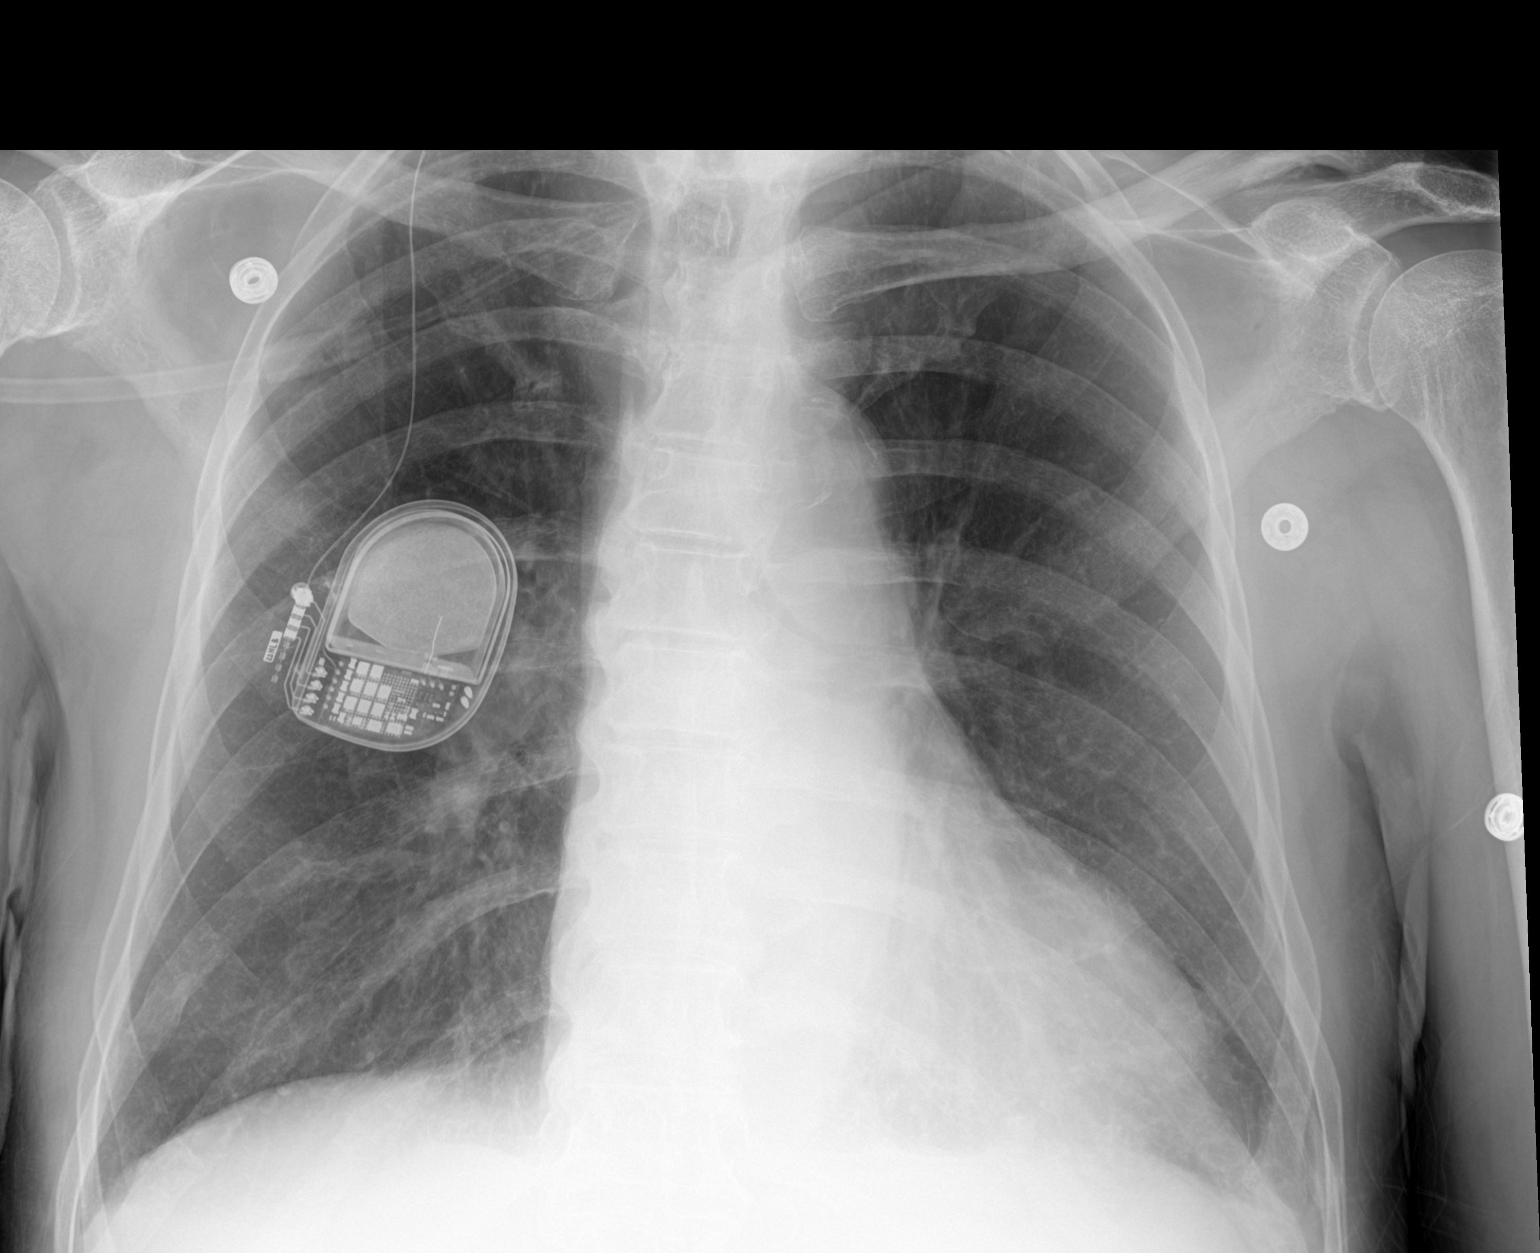

[1 of 1 positions shown; findings below may reference images not displayed]

FINDINGS: Lungs are clear. No pneumothorax or pleural effusion. Cardiac size
within normal limits. Pulmonary vascularity is normal.
Neurostimulator battery pack overlies the right hemithorax with the
electrode extending superiorly, unchanged.
IMPRESSION: No active disease.
